# Patient Record
Sex: Female | Born: 1955
Health system: Southern US, Community
[De-identification: ages and names within clinical notes are randomized; demographics above are authoritative.]

## PROBLEM LIST (undated history)

## (undated) DIAGNOSIS — R011 Cardiac murmur, unspecified: Secondary | ICD-10-CM

## (undated) DIAGNOSIS — R112 Nausea with vomiting, unspecified: Secondary | ICD-10-CM

## (undated) DIAGNOSIS — I10 Essential (primary) hypertension: Secondary | ICD-10-CM

## (undated) DIAGNOSIS — M1990 Unspecified osteoarthritis, unspecified site: Secondary | ICD-10-CM

## (undated) DIAGNOSIS — R7303 Prediabetes: Secondary | ICD-10-CM

## (undated) DIAGNOSIS — E785 Hyperlipidemia, unspecified: Secondary | ICD-10-CM

## (undated) DIAGNOSIS — Z87898 Personal history of other specified conditions: Secondary | ICD-10-CM

## (undated) DIAGNOSIS — K760 Fatty (change of) liver, not elsewhere classified: Secondary | ICD-10-CM

## (undated) DIAGNOSIS — Z9889 Other specified postprocedural states: Secondary | ICD-10-CM

## (undated) DIAGNOSIS — E039 Hypothyroidism, unspecified: Secondary | ICD-10-CM

## (undated) DIAGNOSIS — K8020 Calculus of gallbladder without cholecystitis without obstruction: Secondary | ICD-10-CM

## (undated) DIAGNOSIS — I499 Cardiac arrhythmia, unspecified: Secondary | ICD-10-CM

## (undated) HISTORY — DX: Fatty (change of) liver, not elsewhere classified: K76.0

## (undated) HISTORY — DX: Calculus of gallbladder without cholecystitis without obstruction: K80.20

## (undated) HISTORY — DX: Essential (primary) hypertension: I10

## (undated) HISTORY — DX: Hypothyroidism, unspecified: E03.9

## (undated) HISTORY — PX: DILATION AND CURETTAGE OF UTERUS: SHX78

## (undated) HISTORY — DX: Hyperlipidemia, unspecified: E78.5

## (undated) HISTORY — PX: TONSILLECTOMY: SUR1361

## (undated) HISTORY — PX: JOINT REPLACEMENT: SHX530

## (undated) HISTORY — PX: ABDOMINAL HYSTERECTOMY: SHX81

---

## 1997-10-16 ENCOUNTER — Ambulatory Visit (HOSPITAL_COMMUNITY): Admission: RE | Admit: 1997-10-16 | Discharge: 1997-10-16 | Payer: Self-pay | Admitting: Obstetrics and Gynecology

## 1998-01-07 ENCOUNTER — Inpatient Hospital Stay (HOSPITAL_COMMUNITY): Admission: RE | Admit: 1998-01-07 | Discharge: 1998-01-10 | Payer: Self-pay | Admitting: Obstetrics and Gynecology

## 2001-05-23 ENCOUNTER — Other Ambulatory Visit: Admission: RE | Admit: 2001-05-23 | Discharge: 2001-05-23 | Payer: Self-pay | Admitting: Obstetrics and Gynecology

## 2002-01-23 HISTORY — PX: MOHS SURGERY: SUR867

## 2004-04-14 ENCOUNTER — Ambulatory Visit: Payer: Self-pay | Admitting: Internal Medicine

## 2004-06-16 ENCOUNTER — Other Ambulatory Visit: Admission: RE | Admit: 2004-06-16 | Discharge: 2004-06-16 | Payer: Self-pay | Admitting: Obstetrics and Gynecology

## 2004-07-14 ENCOUNTER — Ambulatory Visit: Payer: Self-pay | Admitting: Internal Medicine

## 2005-06-08 ENCOUNTER — Ambulatory Visit: Payer: Self-pay | Admitting: Internal Medicine

## 2005-06-20 ENCOUNTER — Ambulatory Visit: Payer: Self-pay | Admitting: Internal Medicine

## 2005-07-28 ENCOUNTER — Ambulatory Visit: Payer: Self-pay | Admitting: Internal Medicine

## 2005-08-01 ENCOUNTER — Ambulatory Visit: Payer: Self-pay | Admitting: Internal Medicine

## 2005-10-09 ENCOUNTER — Ambulatory Visit: Payer: Self-pay | Admitting: Internal Medicine

## 2005-10-23 ENCOUNTER — Ambulatory Visit: Payer: Self-pay

## 2005-12-20 ENCOUNTER — Ambulatory Visit: Payer: Self-pay | Admitting: Family Medicine

## 2006-01-08 ENCOUNTER — Ambulatory Visit: Payer: Self-pay

## 2006-02-27 ENCOUNTER — Ambulatory Visit: Payer: Self-pay | Admitting: Internal Medicine

## 2006-02-27 LAB — CONVERTED CEMR LAB
ALT: 25 units/L (ref 0–40)
AST: 28 units/L (ref 0–37)
Albumin: 4 g/dL (ref 3.5–5.2)
Alkaline Phosphatase: 55 units/L (ref 39–117)
BUN: 13 mg/dL (ref 6–23)
Basophils Absolute: 0 10*3/uL (ref 0.0–0.1)
Basophils Relative: 0.9 % (ref 0.0–1.0)
Bilirubin, Direct: 0.1 mg/dL (ref 0.0–0.3)
Cholesterol: 144 mg/dL (ref 0–200)
Creatinine, Ser: 0.9 mg/dL (ref 0.4–1.2)
Creatinine,U: 155 mg/dL
Eosinophils Absolute: 0.2 10*3/uL (ref 0.0–0.6)
Eosinophils Relative: 3.4 % (ref 0.0–5.0)
Free T4: 0.8 ng/dL (ref 0.6–1.6)
HCT: 44 % (ref 36.0–46.0)
HDL: 31.6 mg/dL — ABNORMAL LOW (ref >39.0)
Hemoglobin: 15.2 g/dL — ABNORMAL HIGH (ref 12.0–15.0)
Hgb A1c MFr Bld: 6.1 % — ABNORMAL HIGH (ref 4.6–6.0)
LDL Cholesterol: 74 mg/dL (ref 0–99)
Lymphocytes Relative: 32.7 % (ref 12.0–46.0)
MCHC: 34.6 g/dL (ref 30.0–36.0)
MCV: 86.8 fL (ref 78.0–100.0)
Microalb Creat Ratio: 6.5 mg/g (ref 0.0–30.0)
Microalb, Ur: 1 mg/dL (ref 0.0–1.9)
Monocytes Absolute: 0.4 10*3/uL (ref 0.2–0.7)
Monocytes Relative: 8.3 % (ref 3.0–11.0)
Neutro Abs: 2.9 10*3/uL (ref 1.4–7.7)
Neutrophils Relative %: 54.7 % (ref 43.0–77.0)
Platelets: 343 10*3/uL (ref 150–400)
Potassium: 3.8 meq/L (ref 3.5–5.1)
RBC: 5.07 M/uL (ref 3.87–5.11)
RDW: 13.1 % (ref 11.5–14.6)
T3 Uptake Ratio: 41.7 % — ABNORMAL HIGH (ref 22.5–37.0)
TSH: 1.77 microintl units/mL (ref 0.35–5.50)
Total Bilirubin: 0.7 mg/dL (ref 0.3–1.2)
Total CHOL/HDL Ratio: 4.6
Total Protein: 7.1 g/dL (ref 6.0–8.3)
Triglycerides: 192 mg/dL — ABNORMAL HIGH (ref 0–149)
VLDL: 38 mg/dL (ref 0–40)
WBC: 5.2 10*3/uL (ref 4.5–10.5)

## 2006-03-15 ENCOUNTER — Ambulatory Visit: Payer: Self-pay | Admitting: Internal Medicine

## 2006-05-09 DIAGNOSIS — Z85828 Personal history of other malignant neoplasm of skin: Secondary | ICD-10-CM

## 2006-05-09 DIAGNOSIS — Z9079 Acquired absence of other genital organ(s): Secondary | ICD-10-CM | POA: Insufficient documentation

## 2006-05-09 DIAGNOSIS — Z9089 Acquired absence of other organs: Secondary | ICD-10-CM | POA: Insufficient documentation

## 2006-05-09 HISTORY — DX: Personal history of other malignant neoplasm of skin: Z85.828

## 2006-08-22 ENCOUNTER — Ambulatory Visit: Payer: Self-pay | Admitting: Internal Medicine

## 2006-09-02 LAB — CONVERTED CEMR LAB
Cholesterol: 150 mg/dL (ref 0–200)
Direct LDL: 89.4 mg/dL
HDL: 27.3 mg/dL — ABNORMAL LOW (ref >39.0)
Total CHOL/HDL Ratio: 5.5
Triglycerides: 223 mg/dL (ref 0–149)
VLDL: 45 mg/dL — ABNORMAL HIGH (ref 0–40)

## 2006-09-03 ENCOUNTER — Encounter (INDEPENDENT_AMBULATORY_CARE_PROVIDER_SITE_OTHER): Payer: Self-pay | Admitting: *Deleted

## 2006-11-29 ENCOUNTER — Telehealth (INDEPENDENT_AMBULATORY_CARE_PROVIDER_SITE_OTHER): Payer: Self-pay | Admitting: *Deleted

## 2006-12-05 ENCOUNTER — Telehealth (INDEPENDENT_AMBULATORY_CARE_PROVIDER_SITE_OTHER): Payer: Self-pay | Admitting: *Deleted

## 2007-01-23 ENCOUNTER — Encounter: Payer: Self-pay | Admitting: Internal Medicine

## 2007-01-31 ENCOUNTER — Telehealth (INDEPENDENT_AMBULATORY_CARE_PROVIDER_SITE_OTHER): Payer: Self-pay | Admitting: *Deleted

## 2007-03-21 ENCOUNTER — Telehealth (INDEPENDENT_AMBULATORY_CARE_PROVIDER_SITE_OTHER): Payer: Self-pay | Admitting: *Deleted

## 2007-04-09 ENCOUNTER — Ambulatory Visit: Payer: Self-pay | Admitting: Internal Medicine

## 2007-04-15 ENCOUNTER — Telehealth (INDEPENDENT_AMBULATORY_CARE_PROVIDER_SITE_OTHER): Payer: Self-pay | Admitting: *Deleted

## 2007-04-30 ENCOUNTER — Telehealth: Payer: Self-pay | Admitting: Internal Medicine

## 2007-05-06 ENCOUNTER — Ambulatory Visit: Payer: Self-pay | Admitting: Internal Medicine

## 2007-05-15 ENCOUNTER — Encounter (INDEPENDENT_AMBULATORY_CARE_PROVIDER_SITE_OTHER): Payer: Self-pay | Admitting: *Deleted

## 2007-05-15 LAB — CONVERTED CEMR LAB
ALT: 28 units/L (ref 0–35)
AST: 32 units/L (ref 0–37)
Albumin: 3.8 g/dL (ref 3.5–5.2)
Alkaline Phosphatase: 54 units/L (ref 39–117)
BUN: 12 mg/dL (ref 6–23)
Basophils Absolute: 0 10*3/uL (ref 0.0–0.1)
Basophils Relative: 0.1 % (ref 0.0–1.0)
Bilirubin, Direct: 0.1 mg/dL (ref 0.0–0.3)
CO2: 30 meq/L (ref 19–32)
Calcium: 8.3 mg/dL — ABNORMAL LOW (ref 8.4–10.5)
Chloride: 105 meq/L (ref 96–112)
Cholesterol: 108 mg/dL (ref 0–200)
Creatinine, Ser: 0.8 mg/dL (ref 0.4–1.2)
Eosinophils Absolute: 0.2 10*3/uL (ref 0.0–0.7)
Eosinophils Relative: 3.5 % (ref 0.0–5.0)
GFR calc Af Amer: 97 mL/min
GFR calc non Af Amer: 80 mL/min
Glucose, Bld: 86 mg/dL (ref 70–99)
HCT: 44.4 % (ref 36.0–46.0)
HDL: 24.8 mg/dL — ABNORMAL LOW (ref >39.0)
Hemoglobin: 14.7 g/dL (ref 12.0–15.0)
LDL Cholesterol: 54 mg/dL (ref 0–99)
Lymphocytes Relative: 38.3 % (ref 12.0–46.0)
MCHC: 33 g/dL (ref 30.0–36.0)
MCV: 89.5 fL (ref 78.0–100.0)
Monocytes Absolute: 0.6 10*3/uL (ref 0.1–1.0)
Monocytes Relative: 12.9 % — ABNORMAL HIGH (ref 3.0–12.0)
Neutro Abs: 1.9 10*3/uL (ref 1.4–7.7)
Neutrophils Relative %: 45.2 % (ref 43.0–77.0)
Platelets: 279 10*3/uL (ref 150–400)
Potassium: 3.6 meq/L (ref 3.5–5.1)
RBC: 4.96 M/uL (ref 3.87–5.11)
RDW: 13.2 % (ref 11.5–14.6)
Sodium: 141 meq/L (ref 135–145)
TSH: 4.45 microintl units/mL (ref 0.35–5.50)
Total Bilirubin: 0.6 mg/dL (ref 0.3–1.2)
Total CHOL/HDL Ratio: 4.4
Total Protein: 6.7 g/dL (ref 6.0–8.3)
Triglycerides: 146 mg/dL (ref 0–149)
VLDL: 29 mg/dL (ref 0–40)
WBC: 4.3 10*3/uL — ABNORMAL LOW (ref 4.5–10.5)

## 2007-06-19 ENCOUNTER — Ambulatory Visit: Payer: Self-pay | Admitting: Internal Medicine

## 2007-09-11 ENCOUNTER — Encounter (INDEPENDENT_AMBULATORY_CARE_PROVIDER_SITE_OTHER): Payer: Self-pay | Admitting: *Deleted

## 2007-09-11 ENCOUNTER — Ambulatory Visit: Payer: Self-pay | Admitting: Internal Medicine

## 2007-09-11 DIAGNOSIS — Z9189 Other specified personal risk factors, not elsewhere classified: Secondary | ICD-10-CM | POA: Insufficient documentation

## 2007-09-11 DIAGNOSIS — E785 Hyperlipidemia, unspecified: Secondary | ICD-10-CM

## 2007-09-11 DIAGNOSIS — R413 Other amnesia: Secondary | ICD-10-CM | POA: Insufficient documentation

## 2007-09-11 DIAGNOSIS — I10 Essential (primary) hypertension: Secondary | ICD-10-CM

## 2007-09-11 DIAGNOSIS — E039 Hypothyroidism, unspecified: Secondary | ICD-10-CM | POA: Insufficient documentation

## 2007-09-11 HISTORY — DX: Hyperlipidemia, unspecified: E78.5

## 2007-09-11 HISTORY — DX: Essential (primary) hypertension: I10

## 2007-09-11 LAB — CONVERTED CEMR LAB
Cholesterol, target level: 200 mg/dL
HDL goal, serum: 40 mg/dL
LDL Goal: 160 mg/dL

## 2007-09-16 ENCOUNTER — Encounter (INDEPENDENT_AMBULATORY_CARE_PROVIDER_SITE_OTHER): Payer: Self-pay | Admitting: *Deleted

## 2007-09-16 LAB — CONVERTED CEMR LAB
ALT: 22 units/L (ref 0–35)
AST: 25 units/L (ref 0–37)
Albumin: 3.9 g/dL (ref 3.5–5.2)
Alkaline Phosphatase: 52 units/L (ref 39–117)
BUN: 11 mg/dL (ref 6–23)
Basophils Absolute: 0 10*3/uL (ref 0.0–0.1)
Basophils Relative: 0.7 % (ref 0.0–3.0)
Bilirubin, Direct: 0.1 mg/dL (ref 0.0–0.3)
Cholesterol: 160 mg/dL (ref 0–200)
Creatinine, Ser: 0.9 mg/dL (ref 0.4–1.2)
Eosinophils Absolute: 0.2 10*3/uL (ref 0.0–0.7)
Eosinophils Relative: 2.8 % (ref 0.0–5.0)
HCT: 45.4 % (ref 36.0–46.0)
HDL: 36.7 mg/dL — ABNORMAL LOW (ref >39.0)
Hemoglobin: 15.4 g/dL — ABNORMAL HIGH (ref 12.0–15.0)
LDL Cholesterol: 85 mg/dL (ref 0–99)
Lymphocytes Relative: 27 % (ref 12.0–46.0)
MCHC: 34 g/dL (ref 30.0–36.0)
MCV: 91.5 fL (ref 78.0–100.0)
Monocytes Absolute: 0.4 10*3/uL (ref 0.1–1.0)
Monocytes Relative: 6.4 % (ref 3.0–12.0)
Neutro Abs: 3.6 10*3/uL (ref 1.4–7.7)
Neutrophils Relative %: 63.1 % (ref 43.0–77.0)
Platelets: 314 10*3/uL (ref 150–400)
Potassium: 4.4 meq/L (ref 3.5–5.1)
RBC: 4.96 M/uL (ref 3.87–5.11)
RDW: 13.1 % (ref 11.5–14.6)
TSH: 2.54 microintl units/mL (ref 0.35–5.50)
Total Bilirubin: 0.9 mg/dL (ref 0.3–1.2)
Total CHOL/HDL Ratio: 4.4
Total Protein: 7 g/dL (ref 6.0–8.3)
Triglycerides: 190 mg/dL — ABNORMAL HIGH (ref 0–149)
VLDL: 38 mg/dL (ref 0–40)
WBC: 5.7 10*3/uL (ref 4.5–10.5)

## 2007-11-06 ENCOUNTER — Telehealth (INDEPENDENT_AMBULATORY_CARE_PROVIDER_SITE_OTHER): Payer: Self-pay | Admitting: *Deleted

## 2008-01-24 HISTORY — PX: COLONOSCOPY W/ POLYPECTOMY: SHX1380

## 2008-03-10 ENCOUNTER — Encounter: Payer: Self-pay | Admitting: Internal Medicine

## 2008-03-12 ENCOUNTER — Encounter: Payer: Self-pay | Admitting: Internal Medicine

## 2008-09-11 ENCOUNTER — Telehealth (INDEPENDENT_AMBULATORY_CARE_PROVIDER_SITE_OTHER): Payer: Self-pay | Admitting: *Deleted

## 2008-09-11 ENCOUNTER — Ambulatory Visit: Payer: Self-pay | Admitting: Internal Medicine

## 2008-09-11 DIAGNOSIS — Z8719 Personal history of other diseases of the digestive system: Secondary | ICD-10-CM | POA: Insufficient documentation

## 2008-09-11 DIAGNOSIS — K760 Fatty (change of) liver, not elsewhere classified: Secondary | ICD-10-CM

## 2008-09-11 DIAGNOSIS — K8020 Calculus of gallbladder without cholecystitis without obstruction: Secondary | ICD-10-CM | POA: Insufficient documentation

## 2008-09-11 DIAGNOSIS — K219 Gastro-esophageal reflux disease without esophagitis: Secondary | ICD-10-CM | POA: Insufficient documentation

## 2008-09-11 DIAGNOSIS — E8881 Metabolic syndrome: Secondary | ICD-10-CM

## 2008-09-11 HISTORY — DX: Calculus of gallbladder without cholecystitis without obstruction: K80.20

## 2008-09-11 HISTORY — DX: Metabolic syndrome: E88.810

## 2008-09-11 HISTORY — DX: Fatty (change of) liver, not elsewhere classified: K76.0

## 2008-09-21 ENCOUNTER — Encounter (INDEPENDENT_AMBULATORY_CARE_PROVIDER_SITE_OTHER): Payer: Self-pay | Admitting: *Deleted

## 2008-09-22 ENCOUNTER — Telehealth (INDEPENDENT_AMBULATORY_CARE_PROVIDER_SITE_OTHER): Payer: Self-pay | Admitting: *Deleted

## 2008-09-30 ENCOUNTER — Telehealth (INDEPENDENT_AMBULATORY_CARE_PROVIDER_SITE_OTHER): Payer: Self-pay | Admitting: *Deleted

## 2008-10-26 ENCOUNTER — Telehealth (INDEPENDENT_AMBULATORY_CARE_PROVIDER_SITE_OTHER): Payer: Self-pay | Admitting: *Deleted

## 2008-12-14 ENCOUNTER — Telehealth (INDEPENDENT_AMBULATORY_CARE_PROVIDER_SITE_OTHER): Payer: Self-pay | Admitting: *Deleted

## 2008-12-21 ENCOUNTER — Telehealth (INDEPENDENT_AMBULATORY_CARE_PROVIDER_SITE_OTHER): Payer: Self-pay | Admitting: *Deleted

## 2008-12-21 ENCOUNTER — Encounter (INDEPENDENT_AMBULATORY_CARE_PROVIDER_SITE_OTHER): Payer: Self-pay | Admitting: *Deleted

## 2008-12-22 ENCOUNTER — Ambulatory Visit: Payer: Self-pay | Admitting: Internal Medicine

## 2008-12-22 LAB — CONVERTED CEMR LAB
Ketones, urine, test strip: NEGATIVE
Nitrite: NEGATIVE
Specific Gravity, Urine: 1.01
Urobilinogen, UA: 0.2
WBC Urine, dipstick: NEGATIVE

## 2008-12-28 ENCOUNTER — Telehealth (INDEPENDENT_AMBULATORY_CARE_PROVIDER_SITE_OTHER): Payer: Self-pay | Admitting: *Deleted

## 2008-12-29 LAB — CONVERTED CEMR LAB
ALT: 21 units/L (ref 0–35)
Basophils Absolute: 0 10*3/uL (ref 0.0–0.1)
Basophils Relative: 0.7 % (ref 0.0–3.0)
Chloride: 104 meq/L (ref 96–112)
Cholesterol: 147 mg/dL (ref 0–200)
Eosinophils Absolute: 0.2 10*3/uL (ref 0.0–0.7)
GFR calc non Af Amer: 79.55 mL/min (ref >60)
HDL: 31.8 mg/dL — ABNORMAL LOW (ref >39.00)
MCHC: 33.2 g/dL (ref 30.0–36.0)
MCV: 91.5 fL (ref 78.0–100.0)
Monocytes Absolute: 0.5 10*3/uL (ref 0.1–1.0)
Neutro Abs: 3.9 10*3/uL (ref 1.4–7.7)
Neutrophils Relative %: 63.3 % (ref 43.0–77.0)
Potassium: 4.2 meq/L (ref 3.5–5.1)
RBC: 5.15 M/uL — ABNORMAL HIGH (ref 3.87–5.11)
RDW: 12.4 % (ref 11.5–14.6)
Sodium: 138 meq/L (ref 135–145)
Total Bilirubin: 0.9 mg/dL (ref 0.3–1.2)
Total Protein: 6.8 g/dL (ref 6.0–8.3)
VLDL: 39 mg/dL (ref 0.0–40.0)

## 2008-12-30 ENCOUNTER — Ambulatory Visit: Payer: Self-pay | Admitting: Internal Medicine

## 2009-01-21 ENCOUNTER — Telehealth (INDEPENDENT_AMBULATORY_CARE_PROVIDER_SITE_OTHER): Payer: Self-pay | Admitting: *Deleted

## 2009-03-11 ENCOUNTER — Ambulatory Visit: Payer: Self-pay | Admitting: Internal Medicine

## 2009-03-12 LAB — CONVERTED CEMR LAB
ALT: 22 units/L (ref 0–35)
Amylase: 77 units/L (ref 27–131)
Basophils Absolute: 0 10*3/uL (ref 0.0–0.1)
Eosinophils Absolute: 0.2 10*3/uL (ref 0.0–0.7)
H Pylori IgG: NEGATIVE
HCT: 45.3 % (ref 36.0–46.0)
Lipase: 28 units/L (ref 11.0–59.0)
Lymphs Abs: 2.1 10*3/uL (ref 0.7–4.0)
MCHC: 33.8 g/dL (ref 30.0–36.0)
MCV: 88.4 fL (ref 78.0–100.0)
Monocytes Absolute: 0.5 10*3/uL (ref 0.1–1.0)
Platelets: 374 10*3/uL (ref 150.0–400.0)
RDW: 12.4 % (ref 11.5–14.6)
Total Bilirubin: 0.4 mg/dL (ref 0.3–1.2)

## 2009-04-06 ENCOUNTER — Telehealth (INDEPENDENT_AMBULATORY_CARE_PROVIDER_SITE_OTHER): Payer: Self-pay | Admitting: *Deleted

## 2009-04-07 ENCOUNTER — Encounter (INDEPENDENT_AMBULATORY_CARE_PROVIDER_SITE_OTHER): Payer: Self-pay | Admitting: *Deleted

## 2009-04-08 ENCOUNTER — Encounter (INDEPENDENT_AMBULATORY_CARE_PROVIDER_SITE_OTHER): Payer: Self-pay | Admitting: *Deleted

## 2009-04-19 ENCOUNTER — Encounter (INDEPENDENT_AMBULATORY_CARE_PROVIDER_SITE_OTHER): Payer: Self-pay

## 2009-04-20 ENCOUNTER — Ambulatory Visit: Payer: Self-pay | Admitting: Gastroenterology

## 2009-04-22 ENCOUNTER — Telehealth (INDEPENDENT_AMBULATORY_CARE_PROVIDER_SITE_OTHER): Payer: Self-pay | Admitting: *Deleted

## 2009-05-05 ENCOUNTER — Ambulatory Visit: Payer: Self-pay | Admitting: Gastroenterology

## 2009-05-10 ENCOUNTER — Encounter: Payer: Self-pay | Admitting: Gastroenterology

## 2009-09-02 ENCOUNTER — Telehealth (INDEPENDENT_AMBULATORY_CARE_PROVIDER_SITE_OTHER): Payer: Self-pay | Admitting: *Deleted

## 2009-09-16 ENCOUNTER — Encounter: Admission: RE | Admit: 2009-09-16 | Discharge: 2009-10-25 | Payer: Self-pay | Admitting: Orthopedic Surgery

## 2009-10-07 ENCOUNTER — Telehealth (INDEPENDENT_AMBULATORY_CARE_PROVIDER_SITE_OTHER): Payer: Self-pay | Admitting: *Deleted

## 2010-02-08 ENCOUNTER — Telehealth: Payer: Self-pay | Admitting: Internal Medicine

## 2010-02-09 ENCOUNTER — Other Ambulatory Visit: Payer: Self-pay | Admitting: Internal Medicine

## 2010-02-09 ENCOUNTER — Ambulatory Visit
Admission: RE | Admit: 2010-02-09 | Discharge: 2010-02-09 | Payer: Self-pay | Source: Home / Self Care | Attending: Internal Medicine | Admitting: Internal Medicine

## 2010-02-09 ENCOUNTER — Encounter: Payer: Self-pay | Admitting: Internal Medicine

## 2010-02-09 DIAGNOSIS — Z860100 Personal history of colon polyps, unspecified: Secondary | ICD-10-CM

## 2010-02-09 HISTORY — DX: Personal history of colon polyps, unspecified: Z86.0100

## 2010-02-09 HISTORY — DX: Personal history of colonic polyps: Z86.010

## 2010-02-09 LAB — CBC WITH DIFFERENTIAL/PLATELET
Basophils Absolute: 0 10*3/uL (ref 0.0–0.1)
Basophils Relative: 0.8 % (ref 0.0–3.0)
Eosinophils Absolute: 0.2 10*3/uL (ref 0.0–0.7)
Eosinophils Relative: 3.3 % (ref 0.0–5.0)
HCT: 42.7 % (ref 36.0–46.0)
Hemoglobin: 14.6 g/dL (ref 12.0–15.0)
Lymphocytes Relative: 28.5 % (ref 12.0–46.0)
Lymphs Abs: 1.7 10*3/uL (ref 0.7–4.0)
MCHC: 34.3 g/dL (ref 30.0–36.0)
MCV: 89.3 fl (ref 78.0–100.0)
Monocytes Absolute: 0.4 10*3/uL (ref 0.1–1.0)
Monocytes Relative: 5.9 % (ref 3.0–12.0)
Neutro Abs: 3.8 10*3/uL (ref 1.4–7.7)
Neutrophils Relative %: 61.5 % (ref 43.0–77.0)
Platelets: 318 10*3/uL (ref 150.0–400.0)
RBC: 4.79 Mil/uL (ref 3.87–5.11)
RDW: 13.1 % (ref 11.5–14.6)
WBC: 6.1 10*3/uL (ref 4.5–10.5)

## 2010-02-09 LAB — LIPID PANEL
Cholesterol: 152 mg/dL (ref 0–200)
HDL: 36.7 mg/dL — ABNORMAL LOW (ref >39.00)
Total CHOL/HDL Ratio: 4
Triglycerides: 215 mg/dL — ABNORMAL HIGH (ref 0.0–149.0)
VLDL: 43 mg/dL — ABNORMAL HIGH (ref 0.0–40.0)

## 2010-02-09 LAB — HEPATIC FUNCTION PANEL
ALT: 21 U/L (ref 0–35)
AST: 22 U/L (ref 0–37)
Albumin: 3.8 g/dL (ref 3.5–5.2)
Alkaline Phosphatase: 55 U/L (ref 39–117)
Bilirubin, Direct: 0.1 mg/dL (ref 0.0–0.3)
Total Bilirubin: 0.7 mg/dL (ref 0.3–1.2)
Total Protein: 6.5 g/dL (ref 6.0–8.3)

## 2010-02-09 LAB — BASIC METABOLIC PANEL
BUN: 15 mg/dL (ref 6–23)
CO2: 28 mEq/L (ref 19–32)
Calcium: 9.1 mg/dL (ref 8.4–10.5)
Chloride: 106 mEq/L (ref 96–112)
Creatinine, Ser: 0.8 mg/dL (ref 0.4–1.2)
GFR: 74.88 mL/min (ref >60.00)
Glucose, Bld: 79 mg/dL (ref 70–99)
Potassium: 4.8 mEq/L (ref 3.5–5.1)
Sodium: 141 mEq/L (ref 135–145)

## 2010-02-09 LAB — TSH: TSH: 2.49 u[IU]/mL (ref 0.35–5.50)

## 2010-02-09 LAB — LDL CHOLESTEROL, DIRECT: Direct LDL: 86.7 mg/dL

## 2010-02-17 ENCOUNTER — Telehealth: Payer: Self-pay | Admitting: Internal Medicine

## 2010-02-20 LAB — CONVERTED CEMR LAB
ALT: 21 units/L (ref 0–35)
AST: 25 units/L (ref 0–37)
Albumin: 4 g/dL (ref 3.5–5.2)
BUN: 12 mg/dL (ref 6–23)
CO2: 32 meq/L (ref 19–32)
Chloride: 107 meq/L (ref 96–112)
Cholesterol: 168 mg/dL (ref 0–200)
Creatinine, Ser: 0.9 mg/dL (ref 0.4–1.2)
Direct LDL: 97.5 mg/dL
Glucose, Bld: 95 mg/dL (ref 70–99)
Hgb A1c MFr Bld: 5.9 % (ref 4.6–6.5)
Potassium: 4.5 meq/L (ref 3.5–5.1)
Total Bilirubin: 0.8 mg/dL (ref 0.3–1.2)
Total Protein: 7 g/dL (ref 6.0–8.3)
VLDL: 53.4 mg/dL — ABNORMAL HIGH (ref 0.0–40.0)

## 2010-02-24 NOTE — Procedures (Signed)
Summary: Colonoscopy  Patient: [name] Note: All result statuses are Final unless otherwise noted.  Tests: (1) Colonoscopy (COL)   COL Colonoscopy           DONE     [facility]     [address]     [place], [place]  [address]           COLONOSCOPY PROCEDURE REPORT           PATIENT:  [name] [name], [name] [name]  MR#:  [id number]     BIRTHDATE:  [birth year], 54 yrs. old  GENDER:  female     ENDOSCOPIST:  Rachael Fee, MD     REF. BY:  Marga Melnick, M.D.     PROCEDURE DATE:  05/05/2009     PROCEDURE:  Colonoscopy with snare polypectomy     ASA CLASS:  Class II     INDICATIONS:  Routine Risk Screening     MEDICATIONS:   Fentanyl 75 mcg IV, Versed 6 mg IV     DESCRIPTION OF PROCEDURE:   After the risks benefits and     alternatives of the procedure were thoroughly explained, informed     consent was obtained.  Digital rectal exam was performed and     revealed no rectal masses.   The LB [id number]-[id number] endoscope     was introduced through the anus and advanced to the cecum, which     was identified by both the appendix and ileocecal valve, without     limitations.  The quality of the prep was good, using MoviPrep.     The instrument was then slowly withdrawn as the colon was fully     examined.     <<PROCEDUREIMAGES>>     FINDINGS:  A sessile polyp was found in the sigmoid colon. This     was 6-49mm across, removed with cold snare and sent to pathology     (jar 1) (see image4).  Mild to moderate diverticulosis was found     in the sigmoid to descending colon segments (see image6).     External hemorrhoids were found. These were medium sized, not     thrombosed.  This was otherwise a normal examination of the colon     (see image3, image2, and image7).   Retroflexed views in the     rectum revealed no abnormalities.    The scope was then withdrawn     from the patient and the procedure completed.     COMPLICATIONS:  None           ENDOSCOPIC IMPRESSION:     1)  Subcentimeter sessile polyp in the sigmoid colon; removed and     sent to pathology     2) Mild to modereate diverticulosis in the sigmoid to descending     colon segments     3) Medium sized external hemorrhoids     4) Otherwise normal examination           RECOMMENDATIONS:     1) If the polyp(s) removed today are proven to be adenomatous     (pre-cancerous) polyps, you will need a repeat colonoscopy in 5     years. Otherwise you should continue to follow colorectal cancer     screening guidelines for "routine risk" patients with colonoscopy     in 10 years.     2) You will receive a letter within 1-2 weeks with the results     of your biopsy  as well as final recommendations. Please call my     office if you have not received a letter after 3 weeks.           ______________________________     Rachael Fee, MD           n.     eSIGNED:   Rachael Fee at 05/05/2009 10:08 AM           [place], [id number]  Note: An exclamation mark (!) indicates a result that was not dispersed into the flowsheet. Document Creation Date: 05/05/2009 10:08 AM _______________________________________________________________________  (1) Order result status: Final Collection or observation date-time: 05/05/2009 10:03 Requested date-time:  Receipt date-time:  Reported date-time:  Referring Physician:   Ordering Physician: Rob Bunting [phone]) Specimen Source:  Source: Launa Grill Order Number: [id number] Lab site:   Appended Document: Colonoscopy     Procedures Next Due Date:    Colonoscopy: 04/2014

## 2010-02-24 NOTE — Progress Notes (Signed)
Summary: RX refill  Phone Note Refill Request Message from:  Patient on February 17, 2010 12:42 PM  Refills Requested: Medication #1:  VYTORIN 10-20 MG  TABS TAKE 1/2 TAB EACH EVENING  Medication #2:  SERTRALINE HCL 50 MG TABS 1 by mouth two times a day  Medication #3:  LEVOTHYROXINE SODIUM 100 MCG 1 by mouth qd  Medication #4:  HYDROCHLOROTHIAZIDE 25 MG  TABS 1/2 by mouth once daily Losartan also. [facility]. Sertraline was changed to 50mg  two times a day so the patient can get it free from her job.  Initial call taken by: Lucious Groves CMA,  February 17, 2010 12:42 PM    New/Updated Medications: VYTORIN 10-20 MG  TABS (EZETIMIBE-SIMVASTATIN) TAKE 1/2 TAB EACH EVENING SERTRALINE HCL 50 MG TABS (SERTRALINE HCL) 1 by mouth two times a day Prescriptions: VYTORIN 10-20 MG  TABS (EZETIMIBE-SIMVASTATIN) TAKE 1/2 TAB EACH EVENING  #45 x 3   Entered by:   Lucious Groves CMA   Authorized by:   Marga Melnick MD   Signed by:   Lucious Groves CMA on 02/17/2010   Method used:   Faxed to ...       [facility] (mail-order)             , [place]         Ph: [phone]       Fax: [phone]   RxID:   [id number] LEVOTHYROXINE SODIUM 100 MCG 1 by mouth qd  #90 x 3   Entered by:   Lucious Groves CMA   Authorized by:   Marga Melnick MD   Signed by:   Lucious Groves CMA on 02/17/2010   Method used:   Faxed to ...       [facility] (mail-order)             , [place]         Ph: [phone]       Fax: [phone]   RxID:   [id number] SERTRALINE HCL 50 MG TABS (SERTRALINE HCL) 1 by mouth two times a day  #180 x 3   Entered by:   Lucious Groves CMA   Authorized by:   Marga Melnick MD   Signed by:   Lucious Groves CMA on 02/17/2010   Method used:   Faxed to ...       [facility] (mail-order)             , [place]         Ph: [phone]       Fax: [phone]   RxID:   [id number] HYDROCHLOROTHIAZIDE 25 MG  TABS (HYDROCHLOROTHIAZIDE) 1/2 by mouth once daily  #45 x 3   Entered  by:   Lucious Groves CMA   Authorized by:   Marga Melnick MD   Signed by:   Lucious Groves CMA on 02/17/2010   Method used:   Faxed to ...       [facility] (mail-order)             , [place]         Ph: [phone]       Fax: [phone]   RxID:   [id number] LOSARTAN POTASSIUM 100 MG TABS (LOSARTAN POTASSIUM) 1 once daily  #90 x 0   Entered by:   Lucious Groves CMA   Authorized by:   Marga Melnick MD   Signed by:   Lucious Groves CMA on 02/17/2010   Method used:   Faxed to [facility] [employer])             , [place]         Ph: [phone]       Fax: [phone]   RxID:   [id number]

## 2010-02-24 NOTE — Progress Notes (Signed)
Summary: refill  Phone Note Refill Request Message from:  Fax from Pharmacy on September 02, 2009 9:34 AM  Refills Requested: Medication #1:  HYDROCHLOROTHIAZIDE 25 MG  TABS 1/2 by mouth once daily [facility] - fax [phone]  Initial call taken by: Okey Regal [place],  September 02, 2009 9:42 AM    Prescriptions: HYDROCHLOROTHIAZIDE 25 MG  TABS (HYDROCHLOROTHIAZIDE) 1/2 by mouth once daily  #90 x 1   Entered by:   Shonna Chock CMA   Authorized by:   Marga Melnick MD   Signed by:   Shonna Chock CMA on 09/02/2009   Method used:   Electronically to        [facility]* (retail)       [address]       [place], [place]  [address]       Ph: [phone]       Fax: [phone]   RxID:   [id number]

## 2010-02-24 NOTE — Progress Notes (Signed)
Summary: Ins changes/RX's  Phone Note Call from Patient Call back at [phone]   Summary of Call: Patient called stating that her insurance company has now gone to [employer], but she can get some from NP at her job for free. Patient would like printed prescriptions so she can give them to appropriate place. I made her aware that she is due for CPX and it is best to do it all at one time. Patient expressed understanding and will schedule CPX.  She will call me in the meantime if any prescriptions are needed. Initial call taken by: Lucious Groves CMA,  February 08, 2010 2:56 PM

## 2010-02-24 NOTE — Letter (Signed)
Summary: [facility] Instructions  [facility]  [address] [place], [place] [address]   Phone: [phone]  Fax: [phone]       ERYKA DOLINGER    December 31, 1955    MRN: [id number]        Procedure Day Dorna Bloom:  [facility]  05/05/09     Arrival Time:  9:00AM     Procedure Time:  10:00AM     Location of Procedure:                    _X _  [facility] (4th Floor)                        PREPARATION FOR COLONOSCOPY WITH MOVIPREP   Starting 5 days prior to your procedure 04/30/09 do not eat nuts, seeds, popcorn, corn, beans, peas,  salads, or any raw vegetables.  Do not take any fiber supplements (e.g. Metamucil, Citrucel, and Benefiber).  THE DAY BEFORE YOUR PROCEDURE         DATE: 05/04/09  DAY: TUESDAY  1.  Drink clear liquids the entire day-NO SOLID FOOD  2.  Do not drink anything colored red or purple.  Avoid juices with pulp.  No orange juice.  3.  Drink at least 64 oz. (8 glasses) of fluid/clear liquids during the day to prevent dehydration and help the prep work efficiently.  CLEAR LIQUIDS INCLUDE: Water Jello Ice Popsicles Tea (sugar ok, no milk/cream) Powdered fruit flavored drinks Coffee (sugar ok, no milk/cream) Gatorade Juice: apple, white grape, white cranberry  Lemonade Clear bullion, consomm, broth Carbonated beverages (any kind) Strained chicken noodle soup Hard Candy                             4.  In the morning, mix first dose of MoviPrep solution:    Empty 1 Pouch A and 1 Pouch B into the disposable container    Add lukewarm drinking water to the top line of the container. Mix to dissolve    Refrigerate (mixed solution should be used within 24 hrs)  5.  Begin drinking the prep at 5:00 p.m. The MoviPrep container is divided by 4 marks.   Every 15 minutes drink the solution down to the next mark (approximately 8 oz) until the full liter is complete.   6.  Follow completed prep with 16 oz of clear liquid of your choice  (Nothing red or purple).  Continue to drink clear liquids until bedtime.  7.  Before going to bed, mix second dose of MoviPrep solution:    Empty 1 Pouch A and 1 Pouch B into the disposable container    Add lukewarm drinking water to the top line of the container. Mix to dissolve    Refrigerate  THE DAY OF YOUR PROCEDURE      DATE: 05/05/09 DAY: WEDNESDAY  Beginning at 5:00AM (5 hours before procedure):         1. Every 15 minutes, drink the solution down to the next mark (approx 8 oz) until the full liter is complete.  2. Follow completed prep with 16 oz. of clear liquid of your choice.    3. You may drink clear liquids until 8:00AM (2 HOURS BEFORE PROCEDURE).   MEDICATION INSTRUCTIONS  Unless otherwise instructed, you should take regular prescription medications with a small sip of water   as early as possible the morning of  your procedure.  Diabetic patients - see separate instructions.  Stop taking Plavix or Aggrenox on  _  _  (7 days before procedure).     Stop taking Coumadin on  _ _  (5 days before procedure).  Additional medication instructions: _         OTHER INSTRUCTIONS  You will need a responsible adult at least 55 years of age to accompany you and drive you home.   This person must remain in the waiting room during your procedure.  Wear loose fitting clothing that is easily removed.  Leave jewelry and other valuables at home.  However, you may wish to bring a book to read or  an iPod/MP3 player to listen to music as you wait for your procedure to start.  Remove all body piercing jewelry and leave at home.  Total time from sign-in until discharge is approximately 2-3 hours.  You should go home directly after your procedure and rest.  You can resume normal activities the  day after your procedure.  The day of your procedure you should not:   Drive   Make legal decisions   Operate machinery   Drink alcohol   Return to work  You will receive  specific instructions about eating, activities and medications before you leave.    The above instructions have been reviewed and explained to me by   Ulis Rias RN  April 20, 2009 10:08 AM_    I fully understand and can verbalize these instructions _____________________________ Date _________

## 2010-02-24 NOTE — Letter (Signed)
Summary: Results Letter  [facility]  [address] [place], [place] [address]   Phone: [phone]  Fax: [phone]        May 10, 2009 MRN: [id number]    [facility] [address] [place], [place]  [address]    Dear Ms. Facey,   At least one of the polyps removed during your recent procedure was proven to be adenomatous.  These are pre-cancerous polyps that may have grown into cancers if they had not been removed.  Based on current nationally recognized surveillance guidelines, I recommend that you have a repeat colonoscopy in 5 years.  We will therefore put your information in our reminder system and will contact you in 5 years to schedule a repeat procedure.  Please call if you have any questions or concerns.       Sincerely,  Rachael Fee MD  This letter has been electronically signed by your physician.  Appended Document: Results Letter letter mailed 4.20.11.

## 2010-02-24 NOTE — Assessment & Plan Note (Signed)
Summary: X 3 weeks-diarrhea & upset stomach/scm   Vital Signs:  Patient profile:   55 year old female Weight:      252.8 pounds Temp:     98.4 degrees F oral Pulse rate:   76 / minute Resp:     15 per minute BP sitting:   122 / 82  (left arm) Cuff size:   large  Vitals Entered By: Shonna Chock (March 11, 2009 1:51 PM) CC: Stomach issues x 3 weeks: Upset stomach, diarrhea, pain and gas. ? Gallbladder related, Diarrhea Comments REVIEWED MED LIST, PATIENT AGREED DOSE AND INSTRUCTION CORRECT  Temporary on Zpak for Gum Infection   CC:  Stomach issues x 3 weeks: Upset stomach, diarrhea, pain and gas. ? Gallbladder related, and Diarrhea.  History of Present Illness:  Diarrhea      This is a 55 year old woman who presents with bowel changes > 3 weeks.  The patient reports 3 -4 stools or less per day, semiformed/loose stools, voluminous stools, fecal urgency, fasting diarrhea, bloating, gassiness, and gradual onset of symptoms, but denies blood in stool, mucus in stool, greasy stools, malodorous stools, fecal soiling, alternating diarrhea/constipation, and nocturnal diarrhea.  Associated symptoms include  sharp  epigastric abdominal pain  worse @ night and nausea.  The patient denies fever, abdominal cramps, vomiting, lightheadedness, increased thirst, weight loss, joint pains, mouth ulcers, and eye redness.  The symptoms are worse with specific foods, popcorn & peanuts, but not with milk/ dairy or wheat. Rx: Pepcid & TUMS help.  Patient's risk factors for diarrhea do not  include recent antibiotic use until Zpack Rxed 2 days ago for infected gum.   PMH of large gall stone.  Allergies: 1)  ! Ampicillin 2)  ! * Doxycline  Review of Systems GI:  Complains of gas; Bloating ;  approx 12 mos ago Gyn exam neg . GU:  Denies abnormal vaginal bleeding, discharge, dysuria, and hematuria.  Physical Exam  General:  well-nourished,in no acute distress; alert,appropriate and cooperative throughout  examination Eyes:  No corneal or conjunctival inflammation noted. EOMI Perrla. No icterus  Mouth:  Oral mucosa and oropharynx without lesions or exudates.  Teeth in good repair.No pharyngeal erythema.   Lungs:  Normal respiratory effort, chest expands symmetrically. Lungs are clear to auscultation, no crackles or wheezes. Heart:  Normal rate and regular rhythm. S1 and S2 normal without gallop, murmur, click, rub. Abdomen:  Bowel sounds positive,abdomen soft but slightly tender in epigatric area  without masses, organomegaly or hernias noted. Skin:  Intact without suspicious lesions or rashes but damp Cervical Nodes:  No lymphadenopathy noted Axillary Nodes:  No palpable lymphadenopathy Psych:  memory intact for recent and remote, normally interactive, and good eye contact.     Impression & Recommendations:  Problem # 1:  CHANGE IN BOWELS (GNF-621.30)  Orders: Venipuncture (86578) TLB-Lipase (83690-LIPASE)  Problem # 2:  ABDOMINAL PAIN, EPIGASTRIC (ICD-789.06)  Orders: Venipuncture (46962) TLB-CBC Platelet - w/Differential (85025-CBCD) TLB-Hepatic/Liver Function Pnl (80076-HEPATIC) TLB-Amylase (82150-AMYL) TLB-Lipase (83690-LIPASE) TLB-H. Pylori Abs(Helicobacter Pylori) (86677-HELICO)  Complete Medication List: 1)  Vytorin 10-20 Mg Tabs (Ezetimibe-simvastatin) .... Take 1/2 tab each evening 2)  Sertraline Hcl 100 Mg Tabs (Sertraline hcl) .... Take one tablet daily 3)  Synthroid 100 Mcg Tabs (Levothyroxine sodium) .Marland Kitchen.. 1 by mouth qd 4)  Premarin  .Marland Kitchen.. 1 by mouth once daily 5)  Hydrochlorothiazide 25 Mg Tabs (Hydrochlorothiazide) .... 1/2 by mouth once daily 6)  Losartan Potassium 100 Mg Tabs (Losartan potassium) .Marland Kitchen.. 1 once  daily 7)  Ranitidine Hcl 150 Mg Tabs (Ranitidine hcl) .Marland Kitchen.. 1 two times a day pre meals  Patient Instructions: 1)  Avoid foods high in acid (tomatoes, citrus juices, spicy foods). Avoid eating within two hours of lying down or before exercising. Do not over  eat; try smaller more frequent meals. Elevate head of bed twelve inches when sleeping. Prescriptions: RANITIDINE HCL 150 MG TABS (RANITIDINE HCL) 1 two times a day pre meals  #60 x 2   Entered and Authorized by:   Marga Melnick MD   Signed by:   Marga Melnick MD on 03/11/2009   Method used:   Faxed to ...       [facility]* (retail)       [address]       [place], [place]  [address]       Ph: [phone]       Fax: [phone]   RxID:   [id number]

## 2010-02-24 NOTE — Progress Notes (Signed)
Summary: referral colonoscopy  Phone Note Call from Patient   Summary of Call: PT LEFT VM THAT SHE IS DUE FOR COLONOSCOPY AND IS UNSURE IF SHE NEEDS TO SCHEDULE APPT OR IF SHE NEEDS A REFERRAL. PT STATES THAT SHE WOULD LIKE A CALL BACK TO DISCUSS THIS MATTER..................Marland KitchenFelecia Deloach CMA  April 06, 2009 3:56 PM   Follow-up for Phone Call        called pt back informed pt referral put in just awaiting appt information, pt ok...............Marland KitchenFelecia Deloach CMA  April 06, 2009 4:01 PM   New Problems: SCREENING, COLON CANCER (ICD-V76.51)   New Problems: SCREENING, COLON CANCER (ICD-V76.51)

## 2010-02-24 NOTE — Miscellaneous (Signed)
Summary: Lec previsit  Clinical Lists Changes  Medications: Added new medication of MOVIPREP 100 GM  SOLR (PEG-KCL-NACL-NASULF-NA ASC-C) As per prep instructions. - Signed Rx of MOVIPREP 100 GM  SOLR (PEG-KCL-NACL-NASULF-NA ASC-C) As per prep instructions.;  #1 x 0;  Signed;  Entered by: Ulis Rias RN;  Authorized by: Rachael Fee MD;  Method used: Electronically to [facility] [address], [place], [place], [place]  [address], Ph: [phone], Fax: [phone] Observations: Added new observation of ALLERGY REV: Done (04/20/2009 9:01)    Prescriptions: MOVIPREP 100 GM  SOLR (PEG-KCL-NACL-NASULF-NA ASC-C) As per prep instructions.  #1 x 0   Entered by:   Ulis Rias RN   Authorized by:   Rachael Fee MD   Signed by:   Ulis Rias RN on 04/20/2009   Method used:   Electronically to        [facility]* (retail)       [address]       [place], [place]  [address]       Ph: [phone]       Fax: [phone]   RxID:   [id number]

## 2010-02-24 NOTE — Progress Notes (Signed)
Summary: LOSARTAN REFILL--SEE NOTE  Phone Note Refill Request Message from:  Fax from Pharmacy on October 07, 2009 4:58 PM  Refills Requested: Medication #1:  LOSARTAN POTASSIUM 100 MG TABS 1 once daily [facility], [address] [place], [place]  ZOX [phone]   NOTE SAYS "PATIENT SUBMITTED  PRESCRIPTION THAT HAD MISSING OR UNCLEAR INFO"    QTY = 90  Initial call taken by: Jerolyn Shin,  October 07, 2009 5:01 PM    Prescriptions: LOSARTAN POTASSIUM 100 MG TABS (LOSARTAN POTASSIUM) 1 once daily  #90 x 0   Entered by:   Shonna Chock CMA   Authorized by:   Marga Melnick MD   Signed by:   Shonna Chock CMA on 10/08/2009   Method used:   Faxed to ...       [facility]* (retail)             ,          Ph: [phone]       Fax: [phone]   RxID:   [id number]

## 2010-02-24 NOTE — Letter (Signed)
Summary: Primary Care Consult Scheduled Letter  [facility]  [address] [place], [place] [address]   Phone: [phone]  Fax: [phone]      04/08/2009 MRN: [id number]  [name] [name] [address] [place], [place]  [address]    Dear Ms. Goldsmith,      We have scheduled an appointment for you.  At the recommendation of Dr. Marga Melnick, we have scheduled you for a Screening Colonoscopy with Dr. Rob Bunting of [facility].  Your initial Pre-visit with a Nurse is on 04-20-2009 at 9:00am.  Your Colonoscopy is on 05-05-2009 arrive no later than 9:00am.  Their address is [address] [place] [address]. The office phone number is [phone].  If this appointment day and time is not convenient for you, please feel free to call the office of the doctor you are being referred to at the number listed above and reschedule the appointment.    It is important for you to keep your scheduled appointments. We are here to make sure you are given good patient care.   Thank you,    Renee, Patient Care Coordinator [facility]

## 2010-02-24 NOTE — Assessment & Plan Note (Signed)
Summary: cpx and fasting labs///sph   Vital Signs:  Patient profile:   55 year old female Height:      66.75 inches Weight:      247.4 pounds BMI:     39.18 Temp:     98.5 degrees F oral Pulse rate:   72 / minute Resp:     14 per minute BP sitting:   128 / 80  (left arm) Cuff size:   large  Vitals Entered By: Shonna Chock CMA (February 09, 2010 10:51 AM) CC: CPX with fasting labs , Lipid Management   CC:  CPX with fasting labs  and Lipid Management.  History of Present Illness:    [name] [name] is here for a physical; she is being treated by Dr Madelon Lips for Achilles tendonitis with Diclofenac.    Hyperlipidemia Follow-Up: The patient denies muscle aches, GI upset, abdominal pain, flushing, itching, constipation, diarrhea, and fatigue.  The patient denies the following symptoms: chest pain/pressure, exercise intolerance, dypsnea, palpitations, syncope, and pedal edema.  Compliance with medications (by patient report) has been near 100%.  Dietary compliance has been good.  The patient reports exercising 3-4X per week.  Her Formulary will require med change.    Hypertension Follow-Up:    The patient denies lightheadedness and urinary frequency.  Compliance with medications (by patient report) has been near 100%.  Adjunctive measures currently used by the patient include salt restriction.  BP not monitored  Lipid Management History:      Positive NCEP/ATP III risk factors include HDL cholesterol less than 40, family history for ischemic heart disease (males less than 40 years old), and hypertension.  Negative NCEP/ATP III risk factors include female age less than 14 years old, no history of early menopause without estrogen hormone replacement, non-diabetic, non-tobacco-user status, no ASHD (atherosclerotic heart disease), no prior stroke/TIA, no peripheral vascular disease, and no history of aortic aneurysm.     Current Medications (verified): 1)  Vytorin 10-20 Mg  Tabs  (Ezetimibe-Simvastatin) .... Take 1/2 Tab Each Evening 2)  Sertraline Hcl 100 Mg  Tabs (Sertraline Hcl) .... Take One Tablet Daily 3)  Synthroid 100 Mcg  Tabs (Levothyroxine Sodium) .Marland Kitchen.. 1 By Mouth Qd 4)  Premarin .Marland Kitchen.. 1 By Mouth Once Daily 5)  Hydrochlorothiazide 25 Mg  Tabs (Hydrochlorothiazide) .... 1/2 By Mouth Once Daily 6)  Losartan Potassium 100 Mg Tabs (Losartan Potassium) .Marland Kitchen.. 1 Once Daily 7)  Ranitidine Hcl 150 Mg Tabs (Ranitidine Hcl) .Marland Kitchen.. 1 Two Times A Day Pre Meals  Allergies: 1)  ! Ampicillin 2)  ! * Doxycline  Past History:  Past Medical History: CARCINOMA, BASAL CELL, PMH OF (ICD-V10.83), Dr Janora Norlander, [place], Hyperlipidemia: Fr[place]ham Study LDL goal = < 130. 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  Weight down 15 # with W Ws  Physical Exam  General:  well-nourished;alert,appropriate and cooperative throughout examination Head:  Normocephalic and atraumatic without obvious abnormalities.  Eyes:  No corneal or conjunctival inflammation noted. Perrla. Funduscopic exam benign, without hemorrhages, exudates or  papilledema.  Ears:  External ear exam shows no significant lesions or deformities.  Otoscopic examination reveals clear canals, tympanic membranes are intact bilaterally without bulging, retraction, inflammation or discharge. Hearing is grossly normal bilaterally. Nose:  External nasal examination shows no deformity or inflammation. Nasal mucosa are pink and moist without lesions or exudates. Mouth:  Oral mucosa and oropharynx without lesions or exudates.  Teeth in good repair. Neck:  No deformities, masses, or tenderness noted. Lungs:  Normal respiratory effort, chest expands symmetrically. Lungs are clear to auscultation, no crackles or wheezes. Heart:  normal rate, regular rhythm, no gallop, no rub, no JVD, no HJR, and grade 1/2 -1  /6 systolic murmur.   Abdomen:  Bowel sounds positive,abdomen soft and non-tender without masses, organomegaly or hernias noted. Genitalia:  Dr Tenny Craw, Gyn Msk:  No deformity or scoliosis noted of thoracic or lumbar spine.   Pulses:  R and L carotid,radial,dorsalis pedis and posterior tibial pulses are full and equal bilaterally Extremities:  No clubbing, cyanosis, edema. Decreased ROM L knee Neurologic:  alert & oriented X3 and DTRs symmetrical and normal.   Skin:  Intact without suspicious lesions or rashes Cervical Nodes:  No lymphadenopathy noted Axillary Nodes:  No palpable lymphadenopathy Psych:  memory intact for recent and remote, normally interactive, and good eye contact.     Impression & Recommendations:  Problem # 1:  ROUTINE GENERAL MEDICAL EXAM@[email or web address]  CARE FACL (ICD-V70.0)  Orders: EKG w/ Interpretation (93000) Venipuncture (10932) TLB-Lipid Panel (80061-LIPID) TLB-BMP (Basic Metabolic Panel-BMET) (80048-METABOL) TLB-CBC Platelet - w/Differential (85025-CBCD) TLB-Hepatic/Liver Function Pnl (80076-HEPATIC) TLB-TSH (Thyroid Stimulating Hormone) (84443-TSH)  Problem # 2:  HYPERTENSION (ICD-401.9) controlled Her updated medication list for  this problem includes:    Hydrochlorothiazide 25 Mg Tabs (Hydrochlorothiazide) .Marland Kitchen... 1/2 by mouth once daily    Losartan Potassium 100 Mg Tabs (Losartan potassium) .Marland Kitchen... 1 once daily  Problem # 3:  HYPERLIPIDEMIA (ICD-272.4)  Her updated medication list for this problem includes:    Vytorin 10-20 Mg Tabs (Ezetimibe-simvastatin) .Marland Kitchen... Take 1/2 tab each evening  Problem # 4:  HYPOTHYROIDISM (ICD-244.9)  Complete Medication List: 1)  Vytorin 10-20 Mg Tabs (Ezetimibe-simvastatin) .... Take 1/2 tab each evening 2)  Sertraline Hcl 100 Mg Tabs (Sertraline hcl) .... Take one tablet daily 3)  Levothyroxine Sodium 100 Mcg  .Marland KitchenMarland Kitchen. 1 by mouth qd 4)  Premarin  .Marland Kitchen.. 1 by mouth once daily 5)  Hydrochlorothiazide 25 Mg Tabs (Hydrochlorothiazide) .... 1/2 by mouth once daily 6)  Losartan Potassium 100 Mg Tabs (Losartan potassium) .Marland Kitchen.. 1 once daily 7)  Ranitidine Hcl 150 Mg Tabs (Ranitidine hcl) .Marland Kitchen.. 1 two times a day pre meals  Lipid Assessment/Plan:      Based on NCEP/ATP III, the patient's risk factor category is "2 or more risk factors and a calculated 10 year CAD risk of < 20%".  The patient's lipid goals are as follows: Total cholesterol goal is 200; LDL cholesterol goal is 160; HDL cholesterol goal is 40; Triglyceride goal is 150.  Her LDL cholesterol goal has been met.    Patient Instructions: 1)  Statin change (Pravastatin or Simvastatin)  will be based on Lipids. Rxs will be sent based on labs. 2)  Check your Blood Pressure regularly. If it is above:  135/85 ON AVERAGE you should make an appointment.   Orders Added: 1)  Est. Patient 40-64 years [99396] 2)  EKG w/ Interpretation [93000] 3)  Venipuncture [36415] 4)  TLB-Lipid Panel [80061-LIPID] 5)  TLB-BMP (Basic Metabolic Panel-BMET) [80048-METABOL] 6)  TLB-CBC Platelet - w/Differential [85025-CBCD] 7)  TLB-Hepatic/Liver Function Pnl [80076-HEPATIC] 8)  TLB-TSH (Thyroid Stimulating Hormone) [13244-WNU]

## 2010-02-24 NOTE — Progress Notes (Signed)
Summary: REFILL  Phone Note Refill Request Message from:  Fax from Pharmacy on April 22, 2009 8:20 AM  Refills Requested: Medication #1:  VYTORIN 10-20 MG  TABS TAKE 1/2 [facility] [phone]   Method Requested: Fax to Local Pharmacy Next Appointment Scheduled: NO APPT Initial call taken by: Barb Merino,  April 22, 2009 8:21 AM    Prescriptions: VYTORIN 10-20 MG  TABS (EZETIMIBE-SIMVASTATIN) TAKE 1/2 TAB EACH EVENING  #90 x 0   Entered by:   Shonna Chock   Authorized by:   Marga Melnick MD   Signed by:   Shonna Chock on 04/22/2009   Method used:   Faxed to ...       [facility]* (mail-order)             ,          Ph: [phone]       Fax: [phone]   RxID:   [id number]

## 2010-02-24 NOTE — Letter (Signed)
Summary: Previsit letter  [facility]  [address] [place], [place] [address]   Phone: [phone]  Fax: [phone]       04/07/2009 MRN: [id number]  [facility] [address] [place], [place]  [address]  Dear [name] [name],  Welcome to the Gastroenterology Division at [facility].    You are scheduled to see a nurse for your pre-procedure visit on 04-20-09 at 9am on the 3rd floor at [facility], [address].  We ask that you try to arrive at our office 15 minutes prior to your appointment time to allow for check-in.  Your nurse visit will consist of discussing your medical and surgical history, your immediate family medical history, and your medications.    Please bring a complete list of all your medications or, if you prefer, bring the medication bottles and we will list them.  We will need to be aware of both prescribed and over the counter drugs.  We will need to know exact dosage information as well.  If you are on blood thinners (Coumadin, Plavix, Aggrenox, Ticlid, etc.) please call our office today/prior to your appointment, as we need to consult with your physician about holding your medication.   Please be prepared to read and sign documents such as consent forms, a financial agreement, and acknowledgement forms.  If necessary, and with your consent, a friend or relative is welcome to sit-in on the nurse visit with you.  Please bring your insurance card so that we may make a copy of it.  If your insurance requires a referral to see a specialist, please bring your referral form from your primary care physician.  No co-pay is required for this nurse visit.     If you cannot keep your appointment, please call [phone] to cancel or reschedule prior to your appointment date.  This allows [place] the opportunity to schedule an appointment for another patient in need of care.    Thank you for choosing [facility] for your medical  needs.  We appreciate the opportunity to care for you.  Please visit [place] at our website  to learn more about our practice.                     Sincerely.                                                                                                                   The Gastroenterology Division

## 2010-09-14 ENCOUNTER — Other Ambulatory Visit: Payer: Self-pay | Admitting: Internal Medicine

## 2010-10-10 ENCOUNTER — Ambulatory Visit (INDEPENDENT_AMBULATORY_CARE_PROVIDER_SITE_OTHER): Payer: BC Managed Care – PPO | Admitting: Internal Medicine

## 2010-10-10 ENCOUNTER — Encounter: Payer: Self-pay | Admitting: Internal Medicine

## 2010-10-10 ENCOUNTER — Ambulatory Visit: Payer: Self-pay | Admitting: Internal Medicine

## 2010-10-10 VITALS — BP 130/78 | HR 87 | Wt 253.0 lb

## 2010-10-10 DIAGNOSIS — E039 Hypothyroidism, unspecified: Secondary | ICD-10-CM

## 2010-10-10 DIAGNOSIS — IMO0002 Reserved for concepts with insufficient information to code with codable children: Secondary | ICD-10-CM

## 2010-10-10 DIAGNOSIS — M1710 Unilateral primary osteoarthritis, unspecified knee: Secondary | ICD-10-CM

## 2010-10-10 NOTE — Progress Notes (Signed)
  Subjective:    Patient ID: [name] [name], female    DOB: 1955/09/07, 55 y.o.   MRN: [id number]  HPI #1 ? Thyroid Problem:  Medications status(change in dose/brand/mode of administration):Synthroid was changed to generic 01/2010 Constitutional: Weight change: up ?; Fatigue:yes; Sleep pattern:chronic issue as frequent awakening; Appetite:stable  Visual change(blurred/diplopia/visual loss):no Hoarseness:no; Swallowing issues:choking even with saliva Cardiovascular: Palpitations:minor with anxiety; Racing:no; Irregularity:no GI: Constipation:no; Diarrhea:no Derm: Change in nails/hair/skin:dryness, nails brittle Neuro: Numbness/tingling:in hands , positional @ work & @ night; Tremor:no Psych: Anxiety:yes recently; Depression:no; Panic attacks:no Endo: Temperature intolerance: Heat:no; Cold:yes   #2 Extremity pain Location:L knee Onset:2 + years Trigger/injury:post fall X 2 Pain quality:sharp Pain severity: up to 5 Duration:as long as walking Radiation:no Exacerbating factors: humidity change Treatment/response: Her orthopedist as injected steroids and collagen without benefit. He is recommended at home he replacement Review of systems:  Musculoskeletal:calf   muscle cramps LLE; no  joint stiffness, redness, or swelling Skin:no rash, color change Heme:abnormal bruising or bleeding       Review of Systems she denies abdominal pain, frank dysphagia,dispepsia, melena, or rectal bleeding.     Objective:   Physical Exam  Gen.:  well-nourished; in no acute distress Eyes: Extraocular motion intact; no lid lag or proptosis  Mouth: Excellent pedal hygiene; no definite oropharyngeal erythema.   Neck: Full range of motion; normal thyroid to palpation  Heart: Normal rhythm and rate without significant  gallop, or extra heart sounds Lungs: Chest clear to auscultation without rales,rales, wheezes Neuro:Deep tendon reflexes are equal and within normal limits; no tremor  Skin:  Warm and dry without significant lesions or rashes; no onycholysis  Musculoskeletal:Markedly decreased range of motion left knee with crepitus & pain. No cyanosis, clubbing, or edema. Psych: Normally communicative and interactive; no abnormal mood or affect clinically.         Assessment & Plan:  #1 hypothyroidism; her multiplicity of symptoms ( see above) suggest possible inadequate dose  replacement or lack of efficacy of  the generic thyroid supplement  #2 degenerative joint disease of the left knee, end stage , with compromise of activities of daily living; surgery when she concurs  #3 swallowing dysfunction without definite goiter. Ultrasound may be necessary based on TSH . If  symptoms persistent or progressive, GI  evaluation would be indicated.

## 2010-10-10 NOTE — Patient Instructions (Addendum)
Eat a low-fat diet with lots of fruits and vegetables, up to 7-9 servings per day. Avoid obesity; your goal is waist measurement < 35 inches.Consume less than 30  grams of sugar per day from foods & drinks with High Fructose Corn Sugar as #1,2,3 or # 4 on label. Follow the low carb nutrition program in [employer] as closely as possible to prevent Diabetes progression & complications. White carbohydrates (potatoes, rice, bread, and pasta) have a high spike of sugar and a high load of sugar. For example a  baked potato has a cup of sugar and a  french fry  2 teaspoons of sugar. Yams, wild  rice, whole grained bread &  wheat pasta have been much lower spike and load of  sugar. Portions should be the size of a deck of cards or your palm. Exercise  30-45  minutes a day, 3-4 days a week as Ellipitical machine, stationary bike or water aerobics.  I recommend a GI  consultation  If swallowing issues persist or progress

## 2010-11-17 ENCOUNTER — Other Ambulatory Visit: Payer: Self-pay | Admitting: Internal Medicine

## 2011-03-09 ENCOUNTER — Telehealth: Payer: Self-pay | Admitting: Internal Medicine

## 2011-03-09 NOTE — Telephone Encounter (Signed)
Preferred alternatives are Crestor, Atorvastatin, or Fluvastatin Sodium.

## 2011-03-09 NOTE — Telephone Encounter (Signed)
Do not refill Vytorin ; change to Crestor 20 mg daily # 90  as per your insurance plan's requirements. TEN weeks after change to new medication check   fasting Labs : BMET,Lipids, hepatic panel, CK, TSH. PLEASE BRING THESE INSTRUCTIONS TO FOLLOW UP  LAB APPOINTMENT.This will guarantee correct labs are drawn, eliminating need for repeat blood sampling ( needle sticks ! ). Diagnoses /Codes: 272.4, 995.20. Please see me 3-5 days later

## 2011-03-09 NOTE — Telephone Encounter (Signed)
[facility]: Patient would like alternative to Vytorin. Preferred alternatives are Crestor,

## 2011-03-10 ENCOUNTER — Telehealth: Payer: Self-pay | Admitting: Internal Medicine

## 2011-03-10 MED ORDER — ROSUVASTATIN CALCIUM 20 MG PO TABS: 20.0000 mg | ORAL_TABLET | Freq: Every day | ORAL | Status: DC

## 2011-03-10 NOTE — Telephone Encounter (Signed)
Discuss with patient  

## 2011-03-10 NOTE — Telephone Encounter (Signed)
To: [place] (Daytime Triage) Fax: [phone] From: [facility] Date/ Time: 03/10/2011 12:46 PM Taken By: Lyn Hollingshead, RN Caller: Darl Pikes Facility: not collected Patient: Margaurite, Salido DOB: 1955/02/19 Phone: [phone] Reason for Call: Pt is returning a call, she can be reached at cell [phone] or office [phone]. Regarding Appointment: Appt Date: Appt Time: Unknown

## 2011-03-10 NOTE — Telephone Encounter (Signed)
Left message to call office

## 2011-03-10 NOTE — Telephone Encounter (Signed)
See other phone notes which pertain to switching her from Vytorin to Crestor with followup labs in 10 weeks.

## 2011-06-26 ENCOUNTER — Other Ambulatory Visit: Payer: Self-pay | Admitting: *Deleted

## 2011-06-26 MED ORDER — ROSUVASTATIN CALCIUM 20 MG PO TABS: 20.0000 mg | ORAL_TABLET | Freq: Every day | ORAL | Status: DC

## 2011-06-26 NOTE — Telephone Encounter (Signed)
Per last note labs due in 10 weeks after starting med. Pt aware Rx sent, appt scheduled.

## 2011-06-27 ENCOUNTER — Other Ambulatory Visit (INDEPENDENT_AMBULATORY_CARE_PROVIDER_SITE_OTHER): Payer: BC Managed Care – PPO

## 2011-06-27 DIAGNOSIS — E785 Hyperlipidemia, unspecified: Secondary | ICD-10-CM

## 2011-06-27 DIAGNOSIS — T887XXA Unspecified adverse effect of drug or medicament, initial encounter: Secondary | ICD-10-CM

## 2011-06-27 LAB — BASIC METABOLIC PANEL
BUN: 13 mg/dL (ref 6–23)
Chloride: 106 mEq/L (ref 96–112)
GFR: 73.49 mL/min (ref >60.00)
Glucose, Bld: 93 mg/dL (ref 70–99)
Potassium: 4 mEq/L (ref 3.5–5.1)

## 2011-06-27 LAB — CK: Total CK: 299 U/L — ABNORMAL HIGH (ref 7–177)

## 2011-06-27 LAB — HEPATIC FUNCTION PANEL
ALT: 21 U/L (ref 0–35)
AST: 26 U/L (ref 0–37)
Albumin: 3.8 g/dL (ref 3.5–5.2)

## 2011-06-27 LAB — LIPID PANEL
LDL Cholesterol: 57 mg/dL (ref 0–99)
VLDL: 39 mg/dL (ref 0.0–40.0)

## 2011-06-27 NOTE — Progress Notes (Signed)
Labs only

## 2011-07-03 ENCOUNTER — Encounter: Payer: Self-pay | Admitting: *Deleted

## 2011-07-10 ENCOUNTER — Telehealth: Payer: Self-pay | Admitting: Internal Medicine

## 2011-07-10 MED ORDER — ROSUVASTATIN CALCIUM 20 MG PO TABS: 20.0000 mg | ORAL_TABLET | Freq: Every day | ORAL | Status: DC

## 2011-07-10 NOTE — Telephone Encounter (Signed)
Pt aware Rx sent and f/u not due until 09-2011, Pt ok info.

## 2011-07-10 NOTE — Telephone Encounter (Signed)
OK # 90 

## 2011-07-10 NOTE — Telephone Encounter (Signed)
Caller: Abaigeal/Patient; PCP: Marga Melnick;  Call regarding being put on - Crestor, given 90 day supply and then another 30 day supply. Had Blood Work Done on 06/27/11 At Dr. Frederik Pear Request Before He Would Fill Her Medication, She Got A. Letter Saying Everything Is OK, But At the End of the Letter It [place]iel She Would Need To Make an Appointment Before Getting Medications. She is needing to have RX faxed to Mail order pharmacy soon so she doesn't run out. She is wondering why she needs to come in to have refill. PLEASE CALL HER TO LET HER KNOW CB#: [phone] Medication Questions Protocol.

## 2011-09-04 ENCOUNTER — Other Ambulatory Visit: Payer: Self-pay | Admitting: Internal Medicine

## 2011-09-12 ENCOUNTER — Telehealth: Payer: Self-pay | Admitting: Internal Medicine

## 2011-09-12 NOTE — Telephone Encounter (Signed)
Patient with pending appointment tomorrow

## 2011-09-12 NOTE — Telephone Encounter (Signed)
Caller: [name]/Patient; Patient Name: [name] [name]; PCP: Marga Melnick; Best Callback Phone Number: [phone].  Pt calling regarding authorization form she dropped off on 09/11/11.  Pt states Dr Madelon Lips with [facility] is working pt in to have knee replacement surgery ASAP but they need this authorization form to proceed.  Pt is requesting form be completed and faxed to their office as soon as possible.  Pt is also requesting a call back from the office regarding form completion.  [phone] OR [phone].

## 2011-09-12 NOTE — Telephone Encounter (Signed)
Last seen 9/12; needs pre op evaluation

## 2011-09-13 ENCOUNTER — Encounter: Payer: Self-pay | Admitting: Internal Medicine

## 2011-09-13 ENCOUNTER — Ambulatory Visit (INDEPENDENT_AMBULATORY_CARE_PROVIDER_SITE_OTHER): Payer: BC Managed Care – PPO | Admitting: Internal Medicine

## 2011-09-13 VITALS — BP 124/80 | HR 77 | Temp 98.7°F | Resp 12 | Ht 67.0 in | Wt 255.4 lb

## 2011-09-13 DIAGNOSIS — IMO0002 Reserved for concepts with insufficient information to code with codable children: Secondary | ICD-10-CM

## 2011-09-13 DIAGNOSIS — Z01818 Encounter for other preprocedural examination: Secondary | ICD-10-CM

## 2011-09-13 DIAGNOSIS — R748 Abnormal levels of other serum enzymes: Secondary | ICD-10-CM

## 2011-09-13 DIAGNOSIS — M1710 Unilateral primary osteoarthritis, unspecified knee: Secondary | ICD-10-CM

## 2011-09-13 DIAGNOSIS — M1712 Unilateral primary osteoarthritis, left knee: Secondary | ICD-10-CM

## 2011-09-13 NOTE — Patient Instructions (Addendum)
The most common cause of elevated triglycerides is the ingestion of sugar from high fructose corn syrup sources added to processed foods & drinks.  Eat a low-fat diet with lots of fruits and vegetables, up to 7-9 servings per day. Consume less than 30 (preferably ZERO) grams of sugar per day from foods & drinks with High Fructose Corn Syrup (HFCS) sugar as #1,2,3 or # 4 on label.Whole Foods, [employer] & [employer] do not carry products with HFCS. Follow a  low carb nutrition program such as [place] or The New Sugar Busters  to prevent Diabetes progression . White carbohydrates (potatoes, rice, bread, and pasta) have a high spike of sugar and a high load of sugar. For example a  baked potato has a cup of sugar and a  french fry  2 teaspoons of sugar. Yams, wild  rice, whole grained bread &  wheat pasta have been much lower spike and load of  sugar. Portions should be the size of a deck of cards or your palm.  Review and correct the record as indicated. Please share record with all medical staff seen.   If you activate My Chart; the results can be released to you as soon as they populate from the lab. If you choose not to use this program; the labs have to be reviewed, copied & mailed   causing a delay in getting the results to you.

## 2011-09-13 NOTE — Progress Notes (Signed)
Subjective:    Patient ID: [name] [name], female    DOB: 12-Oct-1955, 56 y.o.   MRN: [id number]  HPI She is scheduled by Dr. Madelon Lips 09/22/11 for total left knee replacement for end-stage degenerative disease. Symptoms have been progressive over 3-4 years. She describes variable intensity and character pain up to a level X, worse after activities of daily living. By the end of the week she states she is using crutches. The joint has actually locked up on occasion; there's been no associated redness or swelling. Symptoms have minimally  responded to multiple intra-articular steroid injections as well as apparent collagen intra-articular injections during this period. She's also taken narcotic pain medications for pain control. Remotely she has had left knee joint displacement on at least 3 occasions.  Past medical history, surgical history, family history, social history were reviewed and updated. Significantly she has had some anesthesia intolerance with marked nausea the past. Imaging has demonstrated hepatic steatosis.  Lab studies performed 06/27/11 were reviewed. CK was mildly elevated at 299; triglycerides 195; AST and ALT were normal. LDL was 57.    Review of Systems Constitutional: no fever, chills, sweats, change in weight  Musculoskeletal:no  muscle cramps or pain Skin:no rash, color change Neuro: no weakness; incontinence (stool/urine); numbness and tingling Heme:no lymphadenopathy; abnormal bruising or bleeding        Objective:   Physical Exam Gen.:  well-nourished in appearance. Alert, appropriate and cooperative throughout exam. Eyes: No corneal or conjunctival inflammation noted.No icterus Nose: External nasal exam reveals no deformity or inflammation. Nasal mucosa are pink and moist. No lesions or exudates noted.  Mouth: Oral mucosa and oropharynx reveal no lesions or exudates. Teeth in good repair. Neck: No deformities, masses, or tenderness noted.  Thyroid  normal. Lungs: Normal respiratory effort; chest expands symmetrically. Lungs are clear to auscultation without rales, wheezes, or increased work of breathing. Heart: Normal rate and rhythm. Normal S1 and S2. No gallop, click, or rub. S4 w/o murmur. Abdomen: Bowel sounds normal; abdomen soft and nontender. No masses, organomegaly or hernias noted.                                                                              Musculoskeletal/extremities: Slight lordosis noted of the upper thoracic  spine. No clubbing, cyanosis, edema, or deformity noted. Dramatic decreased range of motion of the left knee with fusiform changes and crepitus .Tone & strength  normal.Nail health  good. Vascular: Carotid, radial artery, dorsalis pedis and  posterior tibial pulses are full and equal. No bruits present. Neurologic: Alert and oriented x3. Deep tendon reflexes symmetrical and normal.         Skin: Intact without suspicious lesions or rashes. Lymph: No cervical, axillary lymphadenopathy present. Psych: Mood and affect are normal. Normally interactive  Assessment & Plan:  #1 end-stage degenerative changes left knee with a dramatic impact on activities of daily living and with intractable pain. Surgery appropriate.  Medically pertinent issues reviewed and discussed. No contraindication to surgery. EKG does reveal left anterior fascicular block which is stable. No ischemic changes present. Low voltage in the V leads due 2 body habitus/breast tissue

## 2011-09-14 ENCOUNTER — Telehealth: Payer: Self-pay | Admitting: Internal Medicine

## 2011-09-14 NOTE — Telephone Encounter (Signed)
I faxed notes (OV and labs) to [facility], left message on voicemail informing patient.

## 2011-09-14 NOTE — Telephone Encounter (Signed)
Surgery clearance needs to be faxed ASAP to the number on the bottom of the clearance form.  Pt.  Expected this to be done yesterday and is quite anxious to have this done now, because the deadline was yesterday.  Please contact pt.  To let this know that this was done.  Her Number is: [phone], or cell: [phone].

## 2011-09-15 ENCOUNTER — Encounter (HOSPITAL_COMMUNITY): Payer: Self-pay | Admitting: Pharmacy Technician

## 2011-09-18 ENCOUNTER — Other Ambulatory Visit: Payer: Self-pay | Admitting: Physician Assistant

## 2011-09-19 ENCOUNTER — Encounter (HOSPITAL_COMMUNITY): Payer: Self-pay

## 2011-09-19 ENCOUNTER — Encounter (HOSPITAL_COMMUNITY)
Admission: RE | Admit: 2011-09-19 | Discharge: 2011-09-19 | Disposition: A | Discharge status: Home / Self Care | Payer: BC Managed Care – PPO | Source: Ambulatory Visit | Attending: Orthopedic Surgery | Admitting: Orthopedic Surgery

## 2011-09-19 HISTORY — DX: Unspecified osteoarthritis, unspecified site: M19.90

## 2011-09-19 HISTORY — DX: Other specified postprocedural states: Z98.890

## 2011-09-19 HISTORY — DX: Nausea with vomiting, unspecified: R11.2

## 2011-09-19 HISTORY — DX: Nausea with vomiting, unspecified: Z98.890

## 2011-09-19 LAB — CBC WITH DIFFERENTIAL/PLATELET
Eosinophils Absolute: 0.3 10*3/uL (ref 0.0–0.7)
Hemoglobin: 15 g/dL (ref 12.0–15.0)
Lymphocytes Relative: 26 % (ref 12–46)
Lymphs Abs: 2.3 10*3/uL (ref 0.7–4.0)
MCH: 29.4 pg (ref 26.0–34.0)
MCV: 88.8 fL (ref 78.0–100.0)
Monocytes Relative: 7 % (ref 3–12)
Neutrophils Relative %: 63 % (ref 43–77)
RBC: 5.1 MIL/uL (ref 3.87–5.11)
WBC: 8.8 10*3/uL (ref 4.0–10.5)

## 2011-09-19 LAB — COMPREHENSIVE METABOLIC PANEL
ALT: 26 U/L (ref 0–35)
Alkaline Phosphatase: 73 U/L (ref 39–117)
BUN: 15 mg/dL (ref 6–23)
CO2: 28 mEq/L (ref 19–32)
GFR calc Af Amer: 80 mL/min — ABNORMAL LOW (ref >90)
GFR calc non Af Amer: 69 mL/min — ABNORMAL LOW (ref >90)
Glucose, Bld: 92 mg/dL (ref 70–99)
Potassium: 4.1 mEq/L (ref 3.5–5.1)
Total Bilirubin: 0.2 mg/dL — ABNORMAL LOW (ref 0.3–1.2)
Total Protein: 7.4 g/dL (ref 6.0–8.3)

## 2011-09-19 LAB — URINALYSIS, ROUTINE W REFLEX MICROSCOPIC
Bilirubin Urine: NEGATIVE
Glucose, UA: NEGATIVE mg/dL
Hgb urine dipstick: NEGATIVE
Specific Gravity, Urine: 1.022 (ref 1.005–1.030)
pH: 7 (ref 5.0–8.0)

## 2011-09-19 LAB — PROTIME-INR: Prothrombin Time: 13.4 seconds (ref 11.6–15.2)

## 2011-09-19 LAB — SURGICAL PCR SCREEN: Staphylococcus aureus: NEGATIVE

## 2011-09-19 LAB — URINE MICROSCOPIC-ADD ON

## 2011-09-19 NOTE — Pre-Procedure Instructions (Signed)
20 Kynsli Haapala  09/19/2011   Your procedure is scheduled on:  Friday August 30  Report to [facility] at 8:00 AM.  Call this number if you have problems the morning of surgery: [phone]   Remember:   Do not eat or drink:After Midnight.    Take these medicines the morning of surgery with A SIP OF WATER: Synthroid (levothyroxine), Zoloft (sertraline). Vicodin pain pill if needed.   Do not wear jewelry, make-up or nail polish.  Do not wear lotions, powders, or perfumes. You may wear deodorant.  Do not shave 48 hours prior to surgery. Men may shave face and neck.  Do not bring valuables to the hospital.  Contacts, dentures or bridgework may not be worn into surgery.  Leave suitcase in the car. After surgery it may be brought to your room.  For patients admitted to the hospital, checkout time is 11:00 AM the day of discharge.   Patients discharged the day of surgery will not be allowed to drive home.  Name and phone number of your driver: NA  Special Instructions: Incentive Spirometry - Practice and bring it with you on the day of surgery. and CHG Shower Use Special Wash: 1/2 bottle night before surgery and 1/2 bottle morning of surgery.   Please read over the following fact sheets that you were given: Pain Booklet, Coughing and Deep Breathing, Blood Transfusion Information, Total Joint Packet and Surgical Site Infection Prevention

## 2011-09-19 NOTE — Progress Notes (Signed)
Chart left for anesthesia to review EKG, old EKGs on chart.

## 2011-09-20 LAB — URINE CULTURE: Culture: NO GROWTH

## 2011-09-20 LAB — TYPE AND SCREEN: Antibody Screen: NEGATIVE

## 2011-09-20 NOTE — Consult Note (Signed)
Anesthesia Chart Review:  Patient is a 56 year old female scheduled for left TKA by Dr. Madelon Lips on 09/22/11.  History includes non-smoker, obesity, post-operative N/V, HLD, HTN, hypothyroidism, hepatic steatosis, arthritis, cholelithiasis.  PCP is Dr. Alwyn Ren.  He saw her for a preoperative evaluation and felt there was no contraindication for surgery.    Labs noted.  Urine culture is pending.  Normal CXR on 09/19/11.  EKG on 09/13/11 (done at her pre-operative visit with Dr. Shary Decamp) showed NSR, LAFB.  Overall, it appears stable.    Anticipate she can proceed as planned.  Shonna Chock, PA-C

## 2011-09-21 MED ORDER — CLINDAMYCIN PHOSPHATE 900 MG/50ML IV SOLN
900.0000 mg | INTRAVENOUS | Status: AC
  Administered 2011-09-22: 900 mg via INTRAVENOUS
  Filled 2011-09-21: qty 50

## 2011-09-21 NOTE — H&P (Signed)
 NAME: [name] [name] MRN: [id number] DATE: September 19, 2011 DOB: 01/31/1955  COMPLAINT:     Follow up left knee osteoarthritis.  HPI:     The patient is a 56-year-old female with a history of left knee osteoarthritis, high cholesterol, hypothyroidism, and hypertension who is here today to discuss possible left total knee arthroplasty.  She has failed conservative treatments with corticosteroid injection, viscosupplementation, oral NSAIDs, oral pain medications, and also at times required the use of crutches.  The pain is now significantly affecting her quality of life and activities of daily living.   It does occasionally awaken her from sleep at night.   CURRENT MEDICATIONS:    Crestor 20 mg daily, estrogen 0.62 one to one and one-half daily, HCTZ 0.25 mg one-half daily, Synthroid 100 mg daily, losartan 100 mg daily, and sertraline 50 mg daily.     ALLERGIES:     AMPICILLIN and DOXYCYCLINE. She also had problems with nausea following anesthesia in the past.  PAST MEDICAL HISTORY:     Left knee osteoarthritis, high cholesterol, and hypothyroidism, hypertension.  HOSPITALIZATIONS:  Kidney stones around 1992.  PAST SURGICAL HISTORY:     Hysterectomy in 1998, tonsillectomy in 1998, and MOHs surgery for a basal cell 2004.  ROS:    Ten point systemic review is obtained and is positive for high blood pressure and thyroid problems; otherwise noncontributory.  Please refer to patient information form for details.  FAMILY HISTORY:    Positive in her mother for hypertension, diabetes, and cancer; father cancer; brother hypertension and diabetes.  SOCIAL HISTORY:    Nonsmoker, nondrinker. She has two living children.  She is married and lives with her husband.  She works as a client service representative.  She is currently working at this time.  She lives in a two-story residence.  EXAM:     The patient is seated in the examination room in some obvious discomfort favoring the left knee.  She is alert,  oriented, and appears appropriate age.  HT:  5'7"   WT:  255 pounds  BMI:  39.9  TEMP:  98.1 degrees  BP:  133/70    P:  78     R:  18  Skin:   No rashes, no lesions. HEENT:   PERRLA.  Neck is supple with good range of motion.   Chest/Lungs: Clear to auscultation bilaterally.  Normal breath sounds and effort.      Heart:   Regular rate and rhythm.  Does have systolic murmur. Abdomen:   Soft, non tender.  Active bowel sounds in all four quadrants. Breast:    Not indicated for surgery. Rectal: Not indicated for surgery. GU: Not indicated for surgery. Neuro:   Cranial nerves grossly intact. Musculoskeletal:    Examination of the left lower extremity shows that she is neurovascularly intact.  She has tenderness to palpation along the medial and lateral joint lines in the knee, more so on the medial aspect.  Positive patellofemoral crepitus.  Stable ligamentous testing.  Denies calf pain with palpation.  Has intact dorsiflexion and plantar flexion of the ankle.  Ambulates with an antalgic fait favoring the left lower extremity.  She has an approximately 5-10 degree deficit in full extension.  IMPRESSION:      1. Left knee osteoarthritis end-stage. 2. High cholesterol. 3. Hypothyroidism. 4. Hypertension.  RECOMMENDATIONS:     The risks and benefits of left total knee arthroplasty were discussed again today and the patient wishes to proceed.  She   has a preadmission appointment also today at  Hospital with surgery currently scheduled for 09/22/11.  She does wish to go home following the surgery. Home health is currently arranged with [facility].  She had obtained medical clearance from her primary care physician, Dr. Hopper.  She was given preoperative instructions.  Also discussed postoperative DVT prophylaxis and perioperative antibiotics.  She will be given clindamycin perioperatively and Lovenox after surgery.  If she has any questions or concerns prior to surgery she may contact the  office.  Josh Rainbow Salman P.A.-C/[id number]  Auto-Authenticated by Josh Bleu Minerd P.A.-C 

## 2011-09-22 ENCOUNTER — Encounter (HOSPITAL_COMMUNITY): Payer: Self-pay | Admitting: Vascular Surgery

## 2011-09-22 ENCOUNTER — Ambulatory Visit (HOSPITAL_COMMUNITY): Payer: BC Managed Care – PPO | Admitting: Vascular Surgery

## 2011-09-22 ENCOUNTER — Inpatient Hospital Stay (HOSPITAL_COMMUNITY): Payer: BC Managed Care – PPO

## 2011-09-22 ENCOUNTER — Encounter (HOSPITAL_COMMUNITY)
Admission: RE | Disposition: A | Discharge status: Home Health | Payer: Self-pay | Source: Ambulatory Visit | Attending: Orthopedic Surgery

## 2011-09-22 ENCOUNTER — Inpatient Hospital Stay (HOSPITAL_COMMUNITY)
Admission: RE | Admit: 2011-09-22 | Discharge: 2011-09-25 | DRG: 209 | Disposition: A | Discharge status: Home Health | Payer: BC Managed Care – PPO | Source: Ambulatory Visit | Attending: Orthopedic Surgery | Admitting: Orthopedic Surgery

## 2011-09-22 DIAGNOSIS — G4733 Obstructive sleep apnea (adult) (pediatric): Secondary | ICD-10-CM | POA: Diagnosis present

## 2011-09-22 DIAGNOSIS — Z6841 Body Mass Index (BMI) 40.0 and over, adult: Secondary | ICD-10-CM

## 2011-09-22 DIAGNOSIS — Z01811 Encounter for preprocedural respiratory examination: Secondary | ICD-10-CM

## 2011-09-22 DIAGNOSIS — Z79899 Other long term (current) drug therapy: Secondary | ICD-10-CM

## 2011-09-22 DIAGNOSIS — Z01812 Encounter for preprocedural laboratory examination: Secondary | ICD-10-CM

## 2011-09-22 DIAGNOSIS — K219 Gastro-esophageal reflux disease without esophagitis: Secondary | ICD-10-CM | POA: Diagnosis present

## 2011-09-22 DIAGNOSIS — F411 Generalized anxiety disorder: Secondary | ICD-10-CM | POA: Diagnosis present

## 2011-09-22 DIAGNOSIS — M1710 Unilateral primary osteoarthritis, unspecified knee: Principal | ICD-10-CM | POA: Diagnosis present

## 2011-09-22 DIAGNOSIS — E7800 Pure hypercholesterolemia, unspecified: Secondary | ICD-10-CM | POA: Diagnosis present

## 2011-09-22 DIAGNOSIS — E039 Hypothyroidism, unspecified: Secondary | ICD-10-CM | POA: Diagnosis present

## 2011-09-22 DIAGNOSIS — Z01818 Encounter for other preprocedural examination: Secondary | ICD-10-CM

## 2011-09-22 DIAGNOSIS — Z96659 Presence of unspecified artificial knee joint: Secondary | ICD-10-CM

## 2011-09-22 DIAGNOSIS — IMO0002 Reserved for concepts with insufficient information to code with codable children: Principal | ICD-10-CM | POA: Diagnosis present

## 2011-09-22 DIAGNOSIS — I10 Essential (primary) hypertension: Secondary | ICD-10-CM | POA: Diagnosis present

## 2011-09-22 HISTORY — PX: TOTAL KNEE ARTHROPLASTY: SHX125

## 2011-09-22 SURGERY — ARTHROPLASTY, KNEE, TOTAL
Anesthesia: General | Site: Knee | Laterality: Left | Wound class: Clean

## 2011-09-22 MED ORDER — FENTANYL CITRATE 0.05 MG/ML IJ SOLN
INTRAMUSCULAR | Status: AC
  Filled 2011-09-22: qty 2

## 2011-09-22 MED ORDER — MIDAZOLAM HCL 2 MG/2ML IJ SOLN
INTRAMUSCULAR | Status: AC
  Filled 2011-09-22: qty 2

## 2011-09-22 MED ORDER — ONDANSETRON HCL 4 MG/2ML IJ SOLN
INTRAMUSCULAR | Status: DC | PRN
  Administered 2011-09-22: 4 mg via INTRAVENOUS

## 2011-09-22 MED ORDER — ONDANSETRON HCL 4 MG/2ML IJ SOLN: 4.0000 mg | Freq: Four times a day (QID) | INTRAMUSCULAR | Status: DC | PRN

## 2011-09-22 MED ORDER — HYDROMORPHONE HCL PF 1 MG/ML IJ SOLN
0.2500 mg | INTRAMUSCULAR | Status: DC | PRN
  Administered 2011-09-22: 0.5 mg via INTRAVENOUS

## 2011-09-22 MED ORDER — SERTRALINE HCL 50 MG PO TABS
50.0000 mg | ORAL_TABLET | Freq: Every day | ORAL | Status: DC
  Administered 2011-09-23 – 2011-09-25 (×3): 50 mg via ORAL
  Filled 2011-09-22 (×3): qty 1

## 2011-09-22 MED ORDER — LACTATED RINGERS IV SOLN
INTRAVENOUS | Status: DC
  Administered 2011-09-22 (×3): via INTRAVENOUS

## 2011-09-22 MED ORDER — CHLORHEXIDINE GLUCONATE 4 % EX LIQD: 60.0000 mL | Freq: Once | CUTANEOUS | Status: DC

## 2011-09-22 MED ORDER — LIDOCAINE HCL (CARDIAC) 20 MG/ML IV SOLN: INTRAVENOUS | Status: DC | PRN

## 2011-09-22 MED ORDER — SCOPOLAMINE 1 MG/3DAYS TD PT72: 1.0000 | MEDICATED_PATCH | Freq: Once | TRANSDERMAL | Status: DC

## 2011-09-22 MED ORDER — ACETAMINOPHEN 10 MG/ML IV SOLN
1000.0000 mg | Freq: Four times a day (QID) | INTRAVENOUS | Status: AC
  Administered 2011-09-22 – 2011-09-23 (×4): 1000 mg via INTRAVENOUS
  Filled 2011-09-22 (×4): qty 100

## 2011-09-22 MED ORDER — ADULT MULTIVITAMIN W/MINERALS CH
1.0000 | ORAL_TABLET | Freq: Every day | ORAL | Status: DC
  Administered 2011-09-22 – 2011-09-25 (×4): 1 via ORAL
  Filled 2011-09-22 (×4): qty 1

## 2011-09-22 MED ORDER — CLINDAMYCIN PHOSPHATE 600 MG/50ML IV SOLN
600.0000 mg | Freq: Four times a day (QID) | INTRAVENOUS | Status: AC
  Administered 2011-09-22 – 2011-09-23 (×2): 600 mg via INTRAVENOUS
  Filled 2011-09-22 (×2): qty 50

## 2011-09-22 MED ORDER — METHOCARBAMOL 500 MG PO TABS
500.0000 mg | ORAL_TABLET | Freq: Four times a day (QID) | ORAL | Status: DC | PRN
  Administered 2011-09-22 – 2011-09-24 (×7): 500 mg via ORAL
  Filled 2011-09-22 (×8): qty 1

## 2011-09-22 MED ORDER — HYDROMORPHONE HCL PF 1 MG/ML IJ SOLN
1.0000 mg | INTRAMUSCULAR | Status: DC | PRN
  Administered 2011-09-22 – 2011-09-24 (×9): 1 mg via INTRAVENOUS
  Filled 2011-09-22 (×9): qty 1

## 2011-09-22 MED ORDER — DIPHENHYDRAMINE HCL 12.5 MG/5ML PO ELIX: 12.5000 mg | ORAL_SOLUTION | ORAL | Status: DC | PRN

## 2011-09-22 MED ORDER — DEXAMETHASONE SODIUM PHOSPHATE 4 MG/ML IJ SOLN
INTRAMUSCULAR | Status: DC | PRN
  Administered 2011-09-22: 4 mg via INTRAVENOUS

## 2011-09-22 MED ORDER — LOSARTAN POTASSIUM 50 MG PO TABS
100.0000 mg | ORAL_TABLET | Freq: Every day | ORAL | Status: DC
  Administered 2011-09-22 – 2011-09-25 (×4): 100 mg via ORAL
  Filled 2011-09-22 (×4): qty 2

## 2011-09-22 MED ORDER — EPHEDRINE SULFATE 50 MG/ML IJ SOLN
INTRAMUSCULAR | Status: DC | PRN
  Administered 2011-09-22: 10 mg via INTRAVENOUS

## 2011-09-22 MED ORDER — BISACODYL 10 MG RE SUPP: 10.0000 mg | Freq: Every day | RECTAL | Status: DC | PRN

## 2011-09-22 MED ORDER — FLEET ENEMA 7-19 GM/118ML RE ENEM: 1.0000 | ENEMA | Freq: Once | RECTAL | Status: AC | PRN

## 2011-09-22 MED ORDER — ACETAMINOPHEN 10 MG/ML IV SOLN: 1000.0000 mg | Freq: Once | INTRAVENOUS | Status: DC

## 2011-09-22 MED ORDER — SODIUM CHLORIDE 0.9 % IV SOLN
INTRAVENOUS | Status: DC
  Administered 2011-09-23: 07:00:00 via INTRAVENOUS

## 2011-09-22 MED ORDER — HYDROMORPHONE HCL PF 1 MG/ML IJ SOLN
INTRAMUSCULAR | Status: AC
  Filled 2011-09-22: qty 1

## 2011-09-22 MED ORDER — PHENOL 1.4 % MT LIQD: 1.0000 | OROMUCOSAL | Status: DC | PRN

## 2011-09-22 MED ORDER — PROMETHAZINE HCL 25 MG/ML IJ SOLN: 6.2500 mg | INTRAMUSCULAR | Status: DC | PRN

## 2011-09-22 MED ORDER — SCOPOLAMINE 1 MG/3DAYS TD PT72
MEDICATED_PATCH | TRANSDERMAL | Status: DC | PRN
  Administered 2011-09-22: 1 via TRANSDERMAL

## 2011-09-22 MED ORDER — MENTHOL 3 MG MT LOZG: 1.0000 | LOZENGE | OROMUCOSAL | Status: DC | PRN

## 2011-09-22 MED ORDER — ACETAMINOPHEN 10 MG/ML IV SOLN
1000.0000 mg | Freq: Four times a day (QID) | INTRAVENOUS | Status: DC
  Administered 2011-09-22: 1000 mg via INTRAVENOUS

## 2011-09-22 MED ORDER — SENNOSIDES-DOCUSATE SODIUM 8.6-50 MG PO TABS: 1.0000 | ORAL_TABLET | Freq: Every evening | ORAL | Status: DC | PRN

## 2011-09-22 MED ORDER — DOCUSATE SODIUM 100 MG PO CAPS
100.0000 mg | ORAL_CAPSULE | Freq: Two times a day (BID) | ORAL | Status: DC
  Administered 2011-09-22 – 2011-09-25 (×6): 100 mg via ORAL
  Filled 2011-09-22 (×6): qty 1

## 2011-09-22 MED ORDER — SCOPOLAMINE 1 MG/3DAYS TD PT72
1.0000 | MEDICATED_PATCH | Freq: Once | TRANSDERMAL | Status: DC
  Administered 2011-09-22: 1.5 mg via TRANSDERMAL

## 2011-09-22 MED ORDER — VECURONIUM BROMIDE 10 MG IV SOLR
INTRAVENOUS | Status: DC | PRN
  Administered 2011-09-22: 8 mg via INTRAVENOUS

## 2011-09-22 MED ORDER — LIDOCAINE HCL (CARDIAC) 20 MG/ML IV SOLN
INTRAVENOUS | Status: DC | PRN
  Administered 2011-09-22: 100 mg via INTRAVENOUS

## 2011-09-22 MED ORDER — METHOCARBAMOL 100 MG/ML IJ SOLN
500.0000 mg | Freq: Four times a day (QID) | INTRAVENOUS | Status: DC | PRN
  Administered 2011-09-22: 500 mg via INTRAVENOUS
  Filled 2011-09-22: qty 5

## 2011-09-22 MED ORDER — FENTANYL CITRATE 0.05 MG/ML IJ SOLN: 50.0000 ug | INTRAMUSCULAR | Status: DC | PRN

## 2011-09-22 MED ORDER — NEOSTIGMINE METHYLSULFATE 1 MG/ML IJ SOLN
INTRAMUSCULAR | Status: DC | PRN
  Administered 2011-09-22: 1.5 mg via INTRAVENOUS

## 2011-09-22 MED ORDER — OXYCODONE HCL 5 MG PO TABS
5.0000 mg | ORAL_TABLET | ORAL | Status: DC | PRN
  Administered 2011-09-23 – 2011-09-25 (×13): 10 mg via ORAL
  Filled 2011-09-22 (×13): qty 2

## 2011-09-22 MED ORDER — FENTANYL CITRATE 0.05 MG/ML IJ SOLN
INTRAMUSCULAR | Status: DC | PRN
  Administered 2011-09-22: 150 ug via INTRAVENOUS
  Administered 2011-09-22: 50 ug via INTRAVENOUS
  Administered 2011-09-22 (×2): 100 ug via INTRAVENOUS

## 2011-09-22 MED ORDER — METOCLOPRAMIDE HCL 10 MG PO TABS: 5.0000 mg | ORAL_TABLET | Freq: Three times a day (TID) | ORAL | Status: DC | PRN

## 2011-09-22 MED ORDER — SODIUM CHLORIDE 0.9 % IV SOLN: INTRAVENOUS | Status: DC

## 2011-09-22 MED ORDER — METOCLOPRAMIDE HCL 5 MG/ML IJ SOLN: 5.0000 mg | Freq: Three times a day (TID) | INTRAMUSCULAR | Status: DC | PRN

## 2011-09-22 MED ORDER — ACETAMINOPHEN 650 MG RE SUPP: 650.0000 mg | Freq: Four times a day (QID) | RECTAL | Status: DC | PRN

## 2011-09-22 MED ORDER — HYDROCHLOROTHIAZIDE 25 MG PO TABS
12.5000 mg | ORAL_TABLET | Freq: Every day | ORAL | Status: DC
  Administered 2011-09-22 – 2011-09-23 (×2): 12.5 mg via ORAL
  Filled 2011-09-22 (×4): qty 0.5

## 2011-09-22 MED ORDER — ESTRADIOL 1 MG PO TABS
1.0000 mg | ORAL_TABLET | Freq: Every day | ORAL | Status: DC
  Administered 2011-09-22 – 2011-09-25 (×4): 1 mg via ORAL
  Filled 2011-09-22 (×4): qty 1

## 2011-09-22 MED ORDER — ACETAMINOPHEN 325 MG PO TABS: 650.0000 mg | ORAL_TABLET | Freq: Four times a day (QID) | ORAL | Status: DC | PRN

## 2011-09-22 MED ORDER — PROPOFOL 10 MG/ML IV BOLUS
INTRAVENOUS | Status: DC | PRN
  Administered 2011-09-22: 200 mg via INTRAVENOUS

## 2011-09-22 MED ORDER — ATORVASTATIN CALCIUM 40 MG PO TABS
40.0000 mg | ORAL_TABLET | Freq: Every day | ORAL | Status: DC
  Administered 2011-09-22 – 2011-09-24 (×3): 40 mg via ORAL
  Filled 2011-09-22 (×4): qty 1

## 2011-09-22 MED ORDER — GLYCOPYRROLATE 0.2 MG/ML IJ SOLN
INTRAMUSCULAR | Status: DC | PRN
  Administered 2011-09-22 (×2): .2 mg via INTRAVENOUS

## 2011-09-22 MED ORDER — FENTANYL CITRATE 0.05 MG/ML IJ SOLN
50.0000 ug | INTRAMUSCULAR | Status: DC | PRN
  Administered 2011-09-22: 100 ug via INTRAVENOUS

## 2011-09-22 MED ORDER — ACETAMINOPHEN 10 MG/ML IV SOLN
INTRAVENOUS | Status: AC
  Filled 2011-09-22: qty 100

## 2011-09-22 MED ORDER — 0.9 % SODIUM CHLORIDE (POUR BTL) OPTIME
TOPICAL | Status: DC | PRN
  Administered 2011-09-22: 1000 mL

## 2011-09-22 MED ORDER — LEVOTHYROXINE SODIUM 100 MCG PO TABS
100.0000 ug | ORAL_TABLET | Freq: Every day | ORAL | Status: DC
  Administered 2011-09-23 – 2011-09-25 (×3): 100 ug via ORAL
  Filled 2011-09-22 (×4): qty 1

## 2011-09-22 MED ORDER — MIDAZOLAM HCL 2 MG/2ML IJ SOLN
1.0000 mg | INTRAMUSCULAR | Status: DC | PRN
  Administered 2011-09-22: 2 mg via INTRAVENOUS

## 2011-09-22 MED ORDER — ONDANSETRON HCL 4 MG PO TABS: 4.0000 mg | ORAL_TABLET | Freq: Four times a day (QID) | ORAL | Status: DC | PRN

## 2011-09-22 MED ORDER — SODIUM CHLORIDE 0.9 % IR SOLN
Status: DC | PRN
  Administered 2011-09-22: 3000 mL

## 2011-09-22 MED ORDER — MIDAZOLAM HCL 2 MG/2ML IJ SOLN: 1.0000 mg | INTRAMUSCULAR | Status: DC | PRN

## 2011-09-22 MED ORDER — ENOXAPARIN SODIUM 30 MG/0.3ML ~~LOC~~ SOLN
30.0000 mg | Freq: Two times a day (BID) | SUBCUTANEOUS | Status: DC
  Administered 2011-09-23 – 2011-09-25 (×5): 30 mg via SUBCUTANEOUS
  Filled 2011-09-22 (×8): qty 0.3

## 2011-09-22 SURGICAL SUPPLY — 61 items
BANDAGE ESMARK 6X9 LF (GAUZE/BANDAGES/DRESSINGS) ×1 IMPLANT
BLADE SAGITTAL 25.0X1.19X90 (BLADE) ×2 IMPLANT
BLADE SAW SAG 90X13X1.27 (BLADE) ×2 IMPLANT
BNDG ESMARK 6X9 LF (GAUZE/BANDAGES/DRESSINGS) ×2
BOWL SMART MIX CTS (DISPOSABLE) ×2 IMPLANT
CEMENT HV SMART SET (Cement) ×4 IMPLANT
CLOTH BEACON ORANGE TIMEOUT ST (SAFETY) ×2 IMPLANT
COVER BACK TABLE 24X17X13 BIG (DRAPES) IMPLANT
COVER SURGICAL LIGHT HANDLE (MISCELLANEOUS) ×2 IMPLANT
CUFF TOURNIQUET SINGLE 34IN LL (TOURNIQUET CUFF) ×2 IMPLANT
CUFF TOURNIQUET SINGLE 44IN (TOURNIQUET CUFF) IMPLANT
DRAPE INCISE IOBAN 66X45 STRL (DRAPES) IMPLANT
DRAPE ORTHO SPLIT 77X108 STRL (DRAPES) ×2
DRAPE SURG ORHT 6 SPLT 77X108 (DRAPES) ×2 IMPLANT
DRAPE U-SHAPE 47X51 STRL (DRAPES) ×2 IMPLANT
DRSG ADAPTIC 3X8 NADH LF (GAUZE/BANDAGES/DRESSINGS) ×2 IMPLANT
DRSG PAD ABDOMINAL 8X10 ST (GAUZE/BANDAGES/DRESSINGS) ×4 IMPLANT
DURAPREP 26ML APPLICATOR (WOUND CARE) ×2 IMPLANT
ELECT REM PT RETURN 9FT ADLT (ELECTROSURGICAL) ×2
ELECTRODE REM PT RTRN 9FT ADLT (ELECTROSURGICAL) ×1 IMPLANT
EVACUATOR 1/8 PVC DRAIN (DRAIN) ×2 IMPLANT
FACESHIELD LNG OPTICON STERILE (SAFETY) ×6 IMPLANT
FLOSEAL 10ML (HEMOSTASIS) IMPLANT
GLOVE BIO SURGEON STRL SZ8.5 (GLOVE) ×2 IMPLANT
GLOVE BIOGEL PI IND STRL 8 (GLOVE) ×3 IMPLANT
GLOVE BIOGEL PI INDICATOR 8 (GLOVE) ×3
GLOVE ORTHO TXT STRL SZ7.5 (GLOVE) ×2 IMPLANT
GLOVE SURG ORTHO 8.0 STRL STRW (GLOVE) ×2 IMPLANT
GLOVE SURG ORTHO 8.5 STRL (GLOVE) ×2 IMPLANT
GLOVE SURG SS PI 7.5 STRL IVOR (GLOVE) ×2 IMPLANT
GOWN PREVENTION PLUS XLARGE (GOWN DISPOSABLE) ×2 IMPLANT
GOWN PREVENTION PLUS XXLARGE (GOWN DISPOSABLE) ×4 IMPLANT
GOWN STRL NON-REIN LRG LVL3 (GOWN DISPOSABLE) IMPLANT
HANDPIECE INTERPULSE COAX TIP (DISPOSABLE) ×1
HOOD PEEL AWAY FACE SHEILD DIS (HOOD) ×2 IMPLANT
IMMOBILIZER KNEE 22 UNIV (SOFTGOODS) ×2 IMPLANT
KIT BASIN OR (CUSTOM PROCEDURE TRAY) ×2 IMPLANT
KIT ROOM TURNOVER OR (KITS) ×2 IMPLANT
MANIFOLD NEPTUNE II (INSTRUMENTS) ×2 IMPLANT
NEEDLE 22X1 1/2 (OR ONLY) (NEEDLE) IMPLANT
NS IRRIG 1000ML POUR BTL (IV SOLUTION) ×2 IMPLANT
PACK TOTAL JOINT (CUSTOM PROCEDURE TRAY) ×2 IMPLANT
PAD ARMBOARD 7.5X6 YLW CONV (MISCELLANEOUS) ×2 IMPLANT
PAD CAST 4YDX4 CTTN HI CHSV (CAST SUPPLIES) ×1 IMPLANT
PADDING CAST COTTON 4X4 STRL (CAST SUPPLIES) ×1
PADDING CAST COTTON 6X4 STRL (CAST SUPPLIES) ×2 IMPLANT
SET HNDPC FAN SPRY TIP SCT (DISPOSABLE) ×1 IMPLANT
SPONGE GAUZE 4X4 12PLY (GAUZE/BANDAGES/DRESSINGS) ×2 IMPLANT
STAPLER VISISTAT 35W (STAPLE) ×2 IMPLANT
SUCTION FRAZIER TIP 10 FR DISP (SUCTIONS) IMPLANT
SUT ETHIBOND NAB CT1 #1 30IN (SUTURE) ×6 IMPLANT
SUT VIC AB 0 CT1 27 (SUTURE) ×2
SUT VIC AB 0 CT1 27XBRD ANBCTR (SUTURE) ×2 IMPLANT
SUT VIC AB 2-0 CT1 27 (SUTURE) ×2
SUT VIC AB 2-0 CT1 TAPERPNT 27 (SUTURE) ×2 IMPLANT
SUT VLOC 180 0 24IN GS25 (SUTURE) IMPLANT
SYR CONTROL 10ML LL (SYRINGE) IMPLANT
TOWEL OR 17X24 6PK STRL BLUE (TOWEL DISPOSABLE) ×2 IMPLANT
TOWEL OR 17X26 10 PK STRL BLUE (TOWEL DISPOSABLE) ×2 IMPLANT
TRAY FOLEY CATH 14FR (SET/KITS/TRAYS/PACK) ×2 IMPLANT
WATER STERILE IRR 1000ML POUR (IV SOLUTION) ×2 IMPLANT

## 2011-09-22 NOTE — Brief Op Note (Signed)
09/22/2011  1:27 PM  PATIENT:  [name] [name]  56 y.o. female  PRE-OPERATIVE DIAGNOSIS:  osteoarthritis left knee  POST-OPERATIVE DIAGNOSIS:  osteoarthritis left knee  PROCEDURE:  Procedure(s) (LRB): TOTAL KNEE ARTHROPLASTY (Left)  SURGEON:  Surgeon(s) and Role:    * W D Carloyn Manner., MD - Primary  PHYSICIAN ASSISTANT:   ASSISTANTS: Margart Sickles, PA-C   ANESTHESIA:   regional and general  EBL:  Total I/O In: 1700 [I.V.:1700] Out: 280 [Urine:250; Blood:30]  BLOOD ADMINISTERED:none  DRAINS: hemovac left knee self suction  LOCAL MEDICATIONS USED:  NONE  SPECIMEN:  No Specimen  DISPOSITION OF SPECIMEN:  N/A  COUNTS:  YES  TOURNIQUET:   Total Tourniquet Time Documented: Thigh (Left) - 83 minutes  DICTATION: .Other Dictation: Dictation Number   PLAN OF CARE: Admit to inpatient   PATIENT DISPOSITION:  PACU - hemodynamically stable.   Delay start of Pharmacological VTE agent (>24hrs) due to surgical blood loss or risk of bleeding: yes

## 2011-09-22 NOTE — Interval H&P Note (Signed)
History and Physical Interval Note:  09/22/2011 9:51 AM  [name] [name]  has presented today for surgery, with the diagnosis of osteoarthritis left knee  The various methods of treatment have been discussed with the patient and family. After consideration of risks, benefits and other options for treatment, the patient has consented to  Procedure(s) (LRB): TOTAL KNEE ARTHROPLASTY (Left) as a surgical intervention .  The patient's history has been reviewed, patient examined, no change in status, stable for surgery.  I have reviewed the patient's chart and labs.  Questions were answered to the patient's satisfaction.     [name],W D

## 2011-09-22 NOTE — Preoperative (Addendum)
Beta Blockers   Reason not to administer Beta Blockers:Not Applicable 

## 2011-09-22 NOTE — Anesthesia Preprocedure Evaluation (Signed)
Anesthesia Evaluation  Patient identified by MRN, date of birth, ID band Patient awake    Reviewed: Allergy & Precautions, H&P , NPO status , Patient's Chart, lab work & pertinent test results  History of Anesthesia Complications (+) PONV  Airway Mallampati: III TM Distance: >3 FB Neck ROM: Full    Dental   Pulmonary sleep apnea ,  OSA sx-not dx breath sounds clear to auscultation        Cardiovascular hypertension, Rhythm:Regular Rate:Normal     Neuro/Psych Anxiety    GI/Hepatic GERD-  ,  Endo/Other  Hypothyroidism Morbid obesity  Renal/GU      Musculoskeletal  (+) Arthritis -,   Abdominal (+) + obese,   Peds  Hematology   Anesthesia Other Findings   Reproductive/Obstetrics                           Anesthesia Physical Anesthesia Plan  ASA: III  Anesthesia Plan: General   Post-op Pain Management:    Induction: Intravenous  Airway Management Planned: Oral ETT and Video Laryngoscope Planned  Additional Equipment:   Intra-op Plan:   Post-operative Plan: Extubation in OR  Informed Consent: I have reviewed the patients History and Physical, chart, labs and discussed the procedure including the risks, benefits and alternatives for the proposed anesthesia with the patient or authorized representative who has indicated his/her understanding and acceptance.     Plan Discussed with: CRNA and Surgeon  Anesthesia Plan Comments:         Anesthesia Quick Evaluation

## 2011-09-22 NOTE — Op Note (Signed)
dictated

## 2011-09-22 NOTE — Anesthesia Procedure Notes (Signed)
Procedure Name: Intubation Date/Time: 09/22/2011 10:44 AM Performed by: Marena Chancy Pre-anesthesia Checklist: Patient identified, Patient being monitored, Emergency Drugs available, Timeout performed and Suction available Patient Re-evaluated:Patient Re-evaluated prior to inductionOxygen Delivery Method: Circle system utilized Preoxygenation: Pre-oxygenation with 100% oxygen Intubation Type: IV induction Ventilation: Oral airway inserted - appropriate to patient size Tube type: Oral Tube size: 7.5 mm Number of attempts: 1 Airway Equipment and Method: Video-laryngoscopy Placement Confirmation: positive ETCO2 and breath sounds checked- equal and bilateral Secured at: 22 cm Tube secured with: Tape Dental Injury: Teeth and Oropharynx as per pre-operative assessment  Future Recommendations: Recommend- induction with short-acting agent, and alternative techniques readily available

## 2011-09-22 NOTE — Transfer of Care (Signed)
Immediate Anesthesia Transfer of Care Note  Patient: [name] [name]  Procedure(s) Performed: Procedure(s) (LRB): TOTAL KNEE ARTHROPLASTY (Left)  Patient Location: PACU  Anesthesia Type: General  Level of Consciousness: awake, alert  and oriented  Airway & Oxygen Therapy: Patient Spontanous Breathing and Patient connected to nasal cannula oxygen  Post-op Assessment: Report given to PACU RN, Post -op Vital signs reviewed and stable and Patient moving all extremities X 4  Post vital signs: Reviewed and stable  Complications: No apparent anesthesia complications

## 2011-09-22 NOTE — H&P (View-Only) (Signed)
NAME: [name] [name] MRN: #[id number] DATE: September 19, 2011 DOB: 1955/08/17  COMPLAINT:     Follow up left knee osteoarthritis.  HPI:     The patient is a 56 year old female with a history of left knee osteoarthritis, high cholesterol, hypothyroidism, and hypertension who is here today to discuss possible left total knee arthroplasty.  She has failed conservative treatments with corticosteroid injection, viscosupplementation, oral NSAIDs, oral pain medications, and also at times required the use of crutches.  The pain is now significantly affecting her quality of life and activities of daily living.   It does occasionally awaken her from sleep at night.   CURRENT MEDICATIONS:    Crestor 20 mg daily, estrogen 0.62 one to one and one-half daily, HCTZ 0.25 mg one-half daily, Synthroid 100 mg daily, losartan 100 mg daily, and sertraline 50 mg daily.     ALLERGIES:     AMPICILLIN and DOXYCYCLINE. She also had problems with nausea following anesthesia in the past.  PAST MEDICAL HISTORY:     Left knee osteoarthritis, high cholesterol, and hypothyroidism, hypertension.  HOSPITALIZATIONS:  Kidney stones around 1992.  PAST SURGICAL HISTORY:     Hysterectomy in 1998, tonsillectomy in 1998, and MOHs surgery for a basal cell 2004.  ROS:    Ten point systemic review is obtained and is positive for high blood pressure and thyroid problems; otherwise noncontributory.  Please refer to patient information form for details.  FAMILY HISTORY:    Positive in her mother for hypertension, diabetes, and cancer; father cancer; brother hypertension and diabetes.  SOCIAL HISTORY:    Nonsmoker, nondrinker. She has two living children.  She is married and lives with her husband.  She works as a Nurse, mental health.  She is currently working at this time.  She lives in a two-story residence.  EXAM:     The patient is seated in the examination room in some obvious discomfort favoring the left knee.  She is alert,  oriented, and appears appropriate age.  HT:  5'7"   WT:  255 pounds  BMI:  39.9  TEMP:  98.1 degrees  BP:  133/70    P:  78     R:  18  Skin:   No rashes, no lesions. HEENT:   PERRLA.  Neck is supple with good range of motion.   Chest/Lungs: Clear to auscultation bilaterally.  Normal breath sounds and effort.      Heart:   Regular rate and rhythm.  Does have systolic murmur. Abdomen:   Soft, non tender.  Active bowel sounds in all four quadrants. Breast:    Not indicated for surgery. Rectal: Not indicated for surgery. GU: Not indicated for surgery. Neuro:   Cranial nerves grossly intact. Musculoskeletal:    Examination of the left lower extremity shows that she is neurovascularly intact.  She has tenderness to palpation along the medial and lateral joint lines in the knee, more so on the medial aspect.  Positive patellofemoral crepitus.  Stable ligamentous testing.  Denies calf pain with palpation.  Has intact dorsiflexion and plantar flexion of the ankle.  Ambulates with an antalgic fait favoring the left lower extremity.  She has an approximately 5-10 degree deficit in full extension.  IMPRESSION:      1. Left knee osteoarthritis end-stage. 2. High cholesterol. 3. Hypothyroidism. 4. Hypertension.  RECOMMENDATIONS:     The risks and benefits of left total knee arthroplasty were discussed again today and the patient wishes to proceed.  She  has a preadmission appointment also today at [facility] with surgery currently scheduled for 09/22/11.  She does wish to go home following the surgery. Home health is currently arranged with [place].  She had obtained medical clearance from her primary care physician, Dr. Alwyn Ren.  She was given preoperative instructions.  Also discussed postoperative DVT prophylaxis and perioperative antibiotics.  She will be given clindamycin perioperatively and Lovenox after surgery.  If she has any questions or concerns prior to surgery she may contact the  office.  Josh Dorthy Magnussen P.A.-C/[id number]  Auto-Authenticated by Estanislado Spire P.A.-C

## 2011-09-22 NOTE — Anesthesia Postprocedure Evaluation (Signed)
  Anesthesia Post-op Note  Patient: [name] [name]  Procedure(s) Performed: Procedure(s) (LRB): TOTAL KNEE ARTHROPLASTY (Left)  Patient Location: PACU  Anesthesia Type: GA combined with regional for post-op pain  Level of Consciousness: awake  Airway and Oxygen Therapy: Patient Spontanous Breathing  Post-op Pain: mild  Post-op Assessment: Post-op Vital signs reviewed, Patient's Cardiovascular Status Stable, Respiratory Function Stable, Patent Airway, No signs of Nausea or vomiting and Pain level controlled  Post-op Vital Signs: stable  Complications: No apparent anesthesia complications

## 2011-09-22 NOTE — Progress Notes (Signed)
PT Cancellation Note  Treatment cancelled today due to Pt too lethargic to participate in therapy this afternoon.  Will begin pt therapy tomorrow. Marland Kitchen  [name] [name] 09/22/2011, 4:45 PM [name] L. [name] DPT [phone]

## 2011-09-22 NOTE — Progress Notes (Signed)
Orthopedic Tech Progress Note Patient Details:  [name] [name] 01/02/1956 [id number]  CPM Left Knee CPM Left Knee: On Left Knee Flexion (Degrees): 60  Left Knee Extension (Degrees): 0  Additional Comments: trapeze bar   [name], [name] 09/22/2011, 1:44 PM

## 2011-09-23 LAB — CBC
HCT: 35.3 % — ABNORMAL LOW (ref 36.0–46.0)
MCHC: 33.4 g/dL (ref 30.0–36.0)
MCV: 87.8 fL (ref 78.0–100.0)
RDW: 13.2 % (ref 11.5–15.5)

## 2011-09-23 LAB — BASIC METABOLIC PANEL
BUN: 16 mg/dL (ref 6–23)
Chloride: 99 mEq/L (ref 96–112)
Creatinine, Ser: 0.83 mg/dL (ref 0.50–1.10)
GFR calc Af Amer: 90 mL/min — ABNORMAL LOW (ref >90)
GFR calc non Af Amer: 77 mL/min — ABNORMAL LOW (ref >90)

## 2011-09-23 MED ORDER — ACETAMINOPHEN 10 MG/ML IV SOLN
1000.0000 mg | Freq: Four times a day (QID) | INTRAVENOUS | Status: AC
  Administered 2011-09-23 – 2011-09-24 (×3): 1000 mg via INTRAVENOUS
  Filled 2011-09-23 (×4): qty 100

## 2011-09-23 NOTE — Op Note (Signed)
NAME:  [name] [name], [name] [name] NO.:  [id number]  MEDICAL RECORD NO.:  [id number]  LOCATION:  [id number]                        FACILITY:  [facility]  PHYSICIAN:  Dyke Brackett, M.D.    DATE OF BIRTH:  [birth year]  DATE OF PROCEDURE:  09/22/2011 DATE OF DISCHARGE:                              OPERATIVE REPORT   INDICATIONS:  A 56 year old with end-stage arthritis of the left knee, severe pain, thought to be amenable to hospitalization replacement.  PREOPERATIVE DIAGNOSIS:  Severe osteoarthritis, left knee.  PREOPERATIVE DIAGNOSIS:  Severe osteoarthritis, left knee.  OPERATION:  Left knee total knee replacement with Sigma mobile bearing knee with 10-mm bearing size 4 femur and tibia, 38-mm patella.  SURGEON:  Dyke Brackett, MD  ASSISTANT:  Margart Sickles, PA-C  ASSISTANT:  General with nerve block.  TOURNIQUET TIME:  80 minutes.  DESCRIPTION OF PROCEDURE:  Sterile prep and drape, exsanguination of leg, inflation of tourniquet to 350.  Straight skin incision was made with medial parapatellar approach made to the knee.  We cut the distal femur with 11-mm cut due to a mild flexion contracture.  We next directed attention to the tibia with the most diseased medial compartment greater than 3-4 mm below the most diseased medial compartment and appropriate amount of valgus with a neutral cut for the Sigma knee, released the PCL.  We then sized the femur to be a size 4. We placed the internal alignment rod on the femur to set the rotation as well as the pins setting for the chamfer, anterior, posterior and femoral cutting block.  We did this with a 10-mm spacer to equalized the flexion gap to the extension gap at 10 mm.  All cuts were made.  Full extension was possible particularly after some stripping of the medial side of the knee due to the mild varus deformity.  Attention was next directed to the tibia.  Kevin Fenton was cut for the tibia.  I elected to use a standard tibia due to  the patient's young age and excellent bone quality.  Excellent size matched to a 4, which provided coverage right to the cortical edges, which was good given this patient's high BMI.  Box cut was next made for the femur.  We then trialed the femur and tibia with a 10-mm bearing.  Excellent stability was noted.  She actually only had range of motion from 10 to about 80 degrees preoperatively.  Certainly, got her fully extended and then probably could place her to 115 and 120 degrees on the table.  Posterior cleanup was done with release of the PCL, stripping the posterior structures and use of an osteotome posteriorly to remove fair amount of posterior osteophytes.  Patella was cut leaving about 16 mm of thickness patella for an all-poly patella.  All trials were then tried and deemed to be acceptable.  Trial components were removed.  The bony surfaces were irrigated.  The final prosthesis was repaired on the back table with two bags of cement. Cement was allowed to harden to the doughy state.  We then placed tibia followed by femur with a trial bearing.  Cement was allowed to harden. We placed the final patella as  well.  Once the cement was hardened, we removed the trial bearing, small amount of posterior cement.  Once we did this, we released the tourniquet.  No excessive bleeding was noted. We then placed the final bearing and a Hemovac drain.  Closure was affected with interrupted #1 Ethibond, 0, 2-0 Vicryl, skin clips, taken to the recovery room stable condition.     Dyke Brackett, M.D.     WDC/MEDQ  D:  09/22/2011  T:  09/23/2011  Job:  [id number]

## 2011-09-23 NOTE — Progress Notes (Signed)
Physical Therapy Treatment Patient Details Name: [name] [name] MRN: [id number] DOB: [birth year] Today's Date: 09/23/2011 Time: 4098-1191 PT Time Calculation (min): 17 min  PT Assessment / Plan / Recommendation Comments on Treatment Session  Pt presents with excellent strength and ROM in L knee.  Should be able to d/c to home tomorrow.     Follow Up Recommendations  Home health PT    Barriers to Discharge        Equipment Recommendations  Rolling walker with 5" wheels    Recommendations for Other Services    Frequency 7X/week   Plan Discharge plan remains appropriate;Frequency remains appropriate    Precautions / Restrictions Precautions Precautions: Knee Precaution Booklet Issued: Yes (comment) Required Braces or Orthoses: Knee Immobilizer - Left Knee Immobilizer - Left: On except when in CPM Restrictions Weight Bearing Restrictions: Yes LLE Weight Bearing: Weight bearing as tolerated   Pertinent Vitals/Pain Pt reporting 2-3/10 pain in L knee. No intervention required per pt. Placed pt in CPM at end of session 0-65 degrees.     Mobility       Exercises Total Joint Exercises Ankle Circles/Pumps: AROM;Both;10 reps;Seated Quad Sets: AROM;Strengthening;5 reps;Seated;Left Short Arc Quad: Left;10 reps;Supine Heel Slides: Left;10 reps Hip ABduction/ADduction: Left;10 reps Straight Leg Raises: Left;AROM;10 reps   PT Diagnosis:    PT Problem List:   PT Treatment Interventions:     PT Goals Acute Rehab PT Goals PT Goal Formulation: With patient Time For Goal Achievement: 09/30/11 Potential to Achieve Goals: Good Pt will go Supine/Side to Sit: with modified independence;with HOB 0 degrees Pt will Perform Home Exercise Program: Independently PT Goal: Perform Home Exercise Program - Progress: Progressing toward goal  Visit Information  Last PT Received On: 09/23/11 Assistance Needed: +1    Subjective Data  Subjective: I am a little tired Patient Stated Goal:  Return to work.    Cognition  Overall Cognitive Status: Appears within functional limits for tasks assessed/performed Arousal/Alertness: Awake/alert Orientation Level: Appears intact for tasks assessed Behavior During Session: [facility] for tasks performed    Balance     End of Session PT - End of Session Equipment Utilized During Treatment: Gait belt Activity Tolerance: Patient tolerated treatment well Patient left: in CPM;in bed;with call bell/phone within reach Nurse Communication: Mobility status   GP     Reymond Maynez 09/23/2011, 4:14 PM

## 2011-09-23 NOTE — Progress Notes (Signed)
Patient ID: [name] [name], female   DOB: Mar 20, 1955, 56 y.o.   MRN: [id number] PATIENT ID: [name] [name]        MRN:  [id number]          DOB/AGE: 1955/03/24 / 56 y.o.  [name] Campbell, MD   [name], PA-C [address] [place], [place], [place]  [address]                             [phone]   PROGRESS NOTE  Subjective:  negative for Chest Pain  negative for Shortness of Breath  negative for Nausea/Vomiting   negative for Calf Pain  negative for Bowel Movement   Tolerating Diet: yes         Patient reports pain as mild.      Objective: Vital signs in last 24 hours:   Patient Vitals for the past 24 hrs:  BP Temp Pulse Resp SpO2 Height Weight  09/23/11 0615 99/46 mmHg 97.9 F (36.6 C) 87  20  96 % - -  09/23/11 0149 124/55 mmHg 98.1 F (36.7 C) 91  18  96 % - -  09/22/11 2118 129/65 mmHg 98.1 F (36.7 C) 98  18  99 % - -  09/22/11 1551 - - - - - 5\' 7"  (1.702 m) 116 kg (255 lb 11.7 oz)  09/22/11 1456 157/69 mmHg 98.3 F (36.8 C) 94  15  99 % - -  09/22/11 1439 138/67 mmHg - - - - - -  09/22/11 1438 - - 78  13  99 % - -  09/22/11 1430 - - 80  15  98 % - -  09/22/11 1424 144/69 mmHg - - - - - -  09/22/11 1415 - - 72  14  97 % - -  09/22/11 1409 145/70 mmHg - - - - - -  09/22/11 1355 135/61 mmHg - 75  14  99 % - -  09/22/11 1345 - - 74  13  97 % - -  09/22/11 1339 129/62 mmHg - - - - - -  09/22/11 1330 - 98.2 F (36.8 C) 78  12  94 % - -  09/22/11 1324 141/69 mmHg - - - - - -      Intake/Output from previous day:   08/30 0701 - 08/31 0700 In: 1850 [I.V.:1850] Out: 680 [Urine:450; Drains:200]   Intake/Output this shift:       Intake/Output      08/30 0701 - 08/31 0700 08/31 0701 - 09/01 0700   I.V. (mL/kg) 1850 (15.9)    Total Intake(mL/kg) 1850 (15.9)    Urine (mL/kg/hr) 450 (0.2)    Drains 200    Blood 30    Total Output 680    Net +1170            LABORATORY DATA:  Basename 09/23/11 0530 09/19/11 1449  WBC 12.5* 8.8  HGB 11.8*  15.0  HCT 35.3* 45.3  PLT 288 322    Basename 09/23/11 0530 09/19/11 1449  NA 134* 140  K 4.0 4.1  CL 99 102  CO2 27 28  BUN 16 15  CREATININE 0.83 0.91  GLUCOSE 131* 92  CALCIUM 8.8 9.6   Lab Results  Component Value Date   INR 1.00 09/19/2011    Recent Radiographic Studies :  Dg Chest 2 View  09/19/2011  *RADIOLOGY REPORT*  Clinical Data: Preop for left total knee arthroplasty.  CHEST - 2 VIEW  Comparison: None  Findings: The cardiac silhouette, mediastinal and hilar contours are normal.  The lungs are clear.  No pleural effusion.  The bony thorax is intact.  IMPRESSION: Normal chest x-ray.   Original Report Authenticated By: P. Loralie Champagne, M.D.    X-ray Knee Left Port  09/22/2011  *RADIOLOGY REPORT*  Clinical Data: Knee arthroplasty.  PORTABLE LEFT KNEE - 1-2 VIEW  Comparison: None.  Findings: New left three-part total knee arthroplasty is present. Expected postoperative changes of the soft tissues.  No complicating features.  The frontal view is oblique.  IMPRESSION: New uncomplicated left three-part total knee arthroplasty.   Original Report Authenticated By: Andreas Newport, M.D.      Examination:  General appearance: alert, cooperative and no distress  Wound Exam: clean, dry, intact   Drainage:  None: wound tissue dry  Motor Exam: EHL, FHL, Anterior Tibial and Posterior Tibial Intact  Sensory Exam: Superficial Peroneal, Deep Peroneal and Tibial normal  Vascular Exam: Normal  Assessment:    1 Day Post-Op  Procedure(s) (LRB): TOTAL KNEE ARTHROPLASTY (Left)  ADDITIONAL DIAGNOSIS:  Active Problems:  * No active hospital problems. *      Plan: Physical Therapy as ordered Weight Bearing as Tolerated (WBAT)  DVT Prophylaxis:  Lovenox  DISCHARGE PLAN: Home  DISCHARGE NEEDS:   Sitting up in chair, comfortable, ready for PT, D/C IV   [name] [name] W 09/23/2011, 10:12 AM

## 2011-09-23 NOTE — Evaluation (Signed)
Physical Therapy Evaluation Patient Details Name: [name] [name] MRN: [id number] DOB: Nov 12, 1955 Today's Date: 09/23/2011 Time: 4098-1191 PT Time Calculation (min): 28 min  PT Assessment / Plan / Recommendation Clinical Impression  Pt is a 56 y/o female s/p L TKA. Pt will benefit from PT intervention to maximize functional independence for safe return to work.     PT Assessment  Patient needs continued PT services    Follow Up Recommendations  Home health PT    Barriers to Discharge None      Equipment Recommendations  Rolling walker with 5" wheels    Recommendations for Other Services     Frequency 7X/week    Precautions / Restrictions Precautions Precautions: Knee Required Braces or Orthoses: Knee Immobilizer - Left Restrictions Weight Bearing Restrictions: Yes LLE Weight Bearing: Weight bearing as tolerated   Pertinent Vitals/Pain 3/10 pain in L Knee.  Premedicated.       Mobility  Bed Mobility Bed Mobility: Supine to Sit;Sitting - Scoot to Edge of Bed Supine to Sit: 5: Supervision Sitting - Scoot to Edge of Bed: 5: Supervision Details for Bed Mobility Assistance: cues for technique.  Transfers Transfers: Sit to Stand;Stand to Sit Sit to Stand: 4: Min assist;From bed;With upper extremity assist Stand to Sit: 4: Min assist;To chair/3-in-1;With upper extremity assist Details for Transfer Assistance: cues for hand and L LE placement assist for controlled descent. Ambulation/Gait Ambulation/Gait Assistance: 4: Min guard Ambulation Distance (Feet): 80 Feet Assistive device: Rolling walker Ambulation/Gait Assistance Details: Cues for gait seqeuncing and WBAT on L LE  Gait Pattern: Step-to pattern Gait velocity: slow Wheelchair Mobility Wheelchair Mobility: No    Exercises Total Joint Exercises Ankle Circles/Pumps: AROM;Both;10 reps;Seated Quad Sets: AROM;Strengthening;5 reps;Seated;Left   PT Diagnosis: Abnormality of gait;Generalized weakness;Acute pain    PT Problem List: Decreased strength;Decreased range of motion;Decreased activity tolerance;Decreased mobility;Pain;Decreased knowledge of precautions;Decreased knowledge of use of DME PT Treatment Interventions: DME instruction;Gait training;Stair training;Functional mobility training;Therapeutic activities;Therapeutic exercise;Patient/family education;Manual techniques   PT Goals Acute Rehab PT Goals PT Goal Formulation: With patient Time For Goal Achievement: 09/30/11 Potential to Achieve Goals: Good Pt will go Supine/Side to Sit: with modified independence;with HOB 0 degrees PT Goal: Supine/Side to Sit - Progress: Goal set today Pt will go Sit to Supine/Side: with modified independence PT Goal: Sit to Supine/Side - Progress: Goal set today Pt will Transfer Bed to Chair/Chair to Bed: with modified independence PT Transfer Goal: Bed to Chair/Chair to Bed - Progress: Goal set today Pt will Ambulate: >150 feet;with modified independence;with rolling walker PT Goal: Ambulate - Progress: Goal set today Pt will Go Up / Down Stairs: 3-5 stairs;with modified independence;with least restrictive assistive device PT Goal: Up/Down Stairs - Progress: Goal set today Pt will Perform Home Exercise Program: Independently PT Goal: Perform Home Exercise Program - Progress: Goal set today  Visit Information  Last PT Received On: 09/23/11 Assistance Needed: +1    Subjective Data  Subjective: Agree to PT eval  Patient Stated Goal: Return to work    Prior Functioning  Home Living Lives With: Spouse Available Help at Discharge: Family Type of Home: House Home Access: Stairs to enter Secretary/administrator of Steps: 4 Entrance Stairs-Rails: Right;Left Home Layout: Two level;Able to live on main level with bedroom/bathroom Bathroom Shower/Tub: Walk-in Contractor: Standard Bathroom Accessibility: Yes How Accessible: Accessible via wheelchair;Accessible via walker Home Adaptive  Equipment: Bedside commode/3-in-1;Grab bars in shower;Walker - rolling Prior Function Level of Independence: Independent Able to Take Stairs?: Yes Driving: Yes  Vocation: Full time employment Communication Communication: No difficulties Dominant Hand: Right    Cognition  Overall Cognitive Status: Appears within functional limits for tasks assessed/performed Arousal/Alertness: Awake/alert Orientation Level: Appears intact for tasks assessed Behavior During Session: [facility] for tasks performed    Extremity/Trunk Assessment Right Upper Extremity Assessment RUE ROM/Strength/Tone: Within functional levels Left Upper Extremity Assessment LUE ROM/Strength/Tone: Within functional levels Right Lower Extremity Assessment RLE ROM/Strength/Tone: Within functional levels Left Lower Extremity Assessment LLE ROM/Strength/Tone: Deficits;Due to pain LLE ROM/Strength/Tone Deficits: Strength and ROM limited secondary to surgery.    Balance Balance Balance Assessed: No  End of Session PT - End of Session Equipment Utilized During Treatment: Gait belt Activity Tolerance: Patient tolerated treatment well Patient left: in chair;with call bell/phone within reach Nurse Communication: Mobility status  GP     Kathleene Bergemann 09/23/2011, 1:49 PM  Teresa Nicodemus L. Terrace Chiem DPT [phone]

## 2011-09-24 LAB — CBC
HCT: 31.5 % — ABNORMAL LOW (ref 36.0–46.0)
MCHC: 33.3 g/dL (ref 30.0–36.0)
RDW: 13.3 % (ref 11.5–15.5)

## 2011-09-24 MED ORDER — OXYCODONE-ACETAMINOPHEN 5-325 MG PO TABS: 1.0000 | ORAL_TABLET | ORAL | Status: AC | PRN

## 2011-09-24 MED ORDER — ENOXAPARIN SODIUM 30 MG/0.3ML ~~LOC~~ SOLN: 30.0000 mg | Freq: Two times a day (BID) | SUBCUTANEOUS | Status: DC

## 2011-09-24 MED ORDER — HYDROCHLOROTHIAZIDE 12.5 MG PO CAPS
12.5000 mg | ORAL_CAPSULE | Freq: Every day | ORAL | Status: DC
  Administered 2011-09-24 – 2011-09-25 (×2): 12.5 mg via ORAL
  Filled 2011-09-24 (×3): qty 1

## 2011-09-24 MED ORDER — METHOCARBAMOL 500 MG PO TABS: 500.0000 mg | ORAL_TABLET | Freq: Four times a day (QID) | ORAL | Status: AC | PRN

## 2011-09-24 NOTE — Progress Notes (Signed)
Physical Therapy Treatment Patient Details Name: [name] [name] MRN: [id number] DOB: Sep 24, 1955 Today's Date: 09/24/2011 Time: 4098-1191 PT Time Calculation (min): 42 min  PT Assessment / Plan / Recommendation Comments on Treatment Session  Placed pt in CPM at 11:00 am.      Follow Up Recommendations  Home health PT    Barriers to Discharge        Equipment Recommendations  Rolling walker with 5" wheels    Recommendations for Other Services    Frequency 7X/week   Plan Discharge plan remains appropriate;Frequency remains appropriate    Precautions / Restrictions Precautions Precautions: Knee Precaution Booklet Issued: Yes (comment) Required Braces or Orthoses: Knee Immobilizer - Left Knee Immobilizer - Left: On except when in CPM Restrictions Weight Bearing Restrictions: No LLE Weight Bearing: Weight bearing as tolerated   Pertinent Vitals/Pain Pt c/o posterior knee pain when L LE in terminal knee extension.  No intervention required.     Mobility  Bed Mobility Bed Mobility: Supine to Sit;Sitting - Scoot to Edge of Bed;Sit to Supine Supine to Sit: 6: Modified independent (Device/Increase time) Sitting - Scoot to Edge of Bed: 6: Modified independent (Device/Increase time) Sit to Supine: 6: Modified independent (Device/Increase time) Details for Bed Mobility Assistance: Pt using R LE to aid L LE Transfers Transfers: Sit to Stand;Stand to Sit Sit to Stand: 7: Independent Stand to Sit: 7: Independent Details for Transfer Assistance: No assist or cueing required  Ambulation/Gait Ambulation/Gait Assistance: 5: Supervision Ambulation Distance (Feet): 200 Feet Assistive device: Rolling walker Ambulation/Gait Assistance Details: Instructed pt in reciprocal gait, cueing to flex L knee in L LE swing phase.  Gait Pattern: Step-to pattern Gait velocity: improving Stairs: Yes Stairs Assistance: 5: Supervision Stairs Assistance Details (indicate cue type and reason): cueing  for hand placement on rail and sequencing.  Stair Management Technique: One rail Right;Step to pattern Number of Stairs: 4  Wheelchair Mobility Wheelchair Mobility: No    Exercises     PT Diagnosis:    PT Problem List:   PT Treatment Interventions:     PT Goals Acute Rehab PT Goals PT Goal Formulation: With patient Time For Goal Achievement: 09/30/11 Potential to Achieve Goals: Good Pt will go Supine/Side to Sit: with modified independence;with HOB 0 degrees PT Goal: Supine/Side to Sit - Progress: Met Pt will go Sit to Supine/Side: with modified independence PT Goal: Sit to Supine/Side - Progress: Met Pt will Transfer Bed to Chair/Chair to Bed: with modified independence PT Transfer Goal: Bed to Chair/Chair to Bed - Progress: Met Pt will Ambulate: >150 feet;with modified independence;with rolling walker PT Goal: Ambulate - Progress: Met Pt will Go Up / Down Stairs: 3-5 stairs;with modified independence;with least restrictive assistive device PT Goal: Up/Down Stairs - Progress: Progressing toward goal Pt will Perform Home Exercise Program: Independently  Visit Information  Last PT Received On: 09/24/11 Assistance Needed: +1    Subjective Data  Patient Stated Goal: Return to work.    Cognition  Overall Cognitive Status: Appears within functional limits for tasks assessed/performed Arousal/Alertness: Awake/alert Orientation Level: Appears intact for tasks assessed Behavior During Session: [facility] for tasks performed    Balance     End of Session PT - End of Session Equipment Utilized During Treatment: Gait belt Activity Tolerance: Patient tolerated treatment well Patient left: in CPM;in bed;with call bell/phone within reach Nurse Communication: Mobility status CPM Left Knee CPM Left Knee: On Left Knee Flexion (Degrees): 60  Left Knee Extension (Degrees): 0  Additional Comments: trapeze  bar   GP     [name] [name] 09/24/2011, 11:10 AM [name] [name] L. Jontavia Leatherbury DPT [phone]

## 2011-09-24 NOTE — Progress Notes (Signed)
Occupational Therapy Evaluation Patient Details Name: [name] [name] MRN: [id number] DOB: 12/13/1955 Today's Date: 09/24/2011 Time: 0925-     OT Assessment / Plan / Recommendation Clinical Impression  56 yo s/p L TKA. Pt making excellent progress. Completed all education regarding ADL and mobility for ADL. No further OT needed. No further equipment needs.    OT Assessment  Patient does not need any further OT services    Follow Up Recommendations  No OT follow up    Barriers to Discharge      Equipment Recommendations  Rolling walker with 5" wheels    Recommendations for Other Services    Frequency       Precautions / Restrictions Precautions Precautions: Knee Precaution Booklet Issued: Yes (comment) Required Braces or Orthoses: Knee Immobilizer - Left Knee Immobilizer - Left: On except when in CPM Restrictions Weight Bearing Restrictions: No LLE Weight Bearing: Weight bearing as tolerated   Pertinent Vitals/Pain 4    ADL  Grooming: Performed;Modified independent Where Assessed - Grooming: Unsupported standing Upper Body Bathing: Simulated;Modified independent Where Assessed - Upper Body Bathing: Unsupported sit to stand Lower Body Bathing: Simulated;Minimal assistance Where Assessed - Lower Body Bathing: Unsupported sit to stand Upper Body Dressing: Simulated;Modified independent Where Assessed - Upper Body Dressing: Unsupported sitting Lower Body Dressing: Simulated;Minimal assistance Where Assessed - Lower Body Dressing: Unsupported sit to stand Toilet Transfer: Performed;Supervision/safety Toilet Transfer Method: Sit to stand;Stand pivot Acupuncturist: Bedside commode;Other (comment) (over toilet) Toileting - Clothing Manipulation and Hygiene: Performed;Independent Where Assessed - Toileting Clothing Manipulation and Hygiene: Standing Tub/Shower Transfer: Engineer, manufacturing Method: Land: Other (comment) (3 in 1) Equipment Used: Knee Immobilizer;Rolling walker;Reacher;Long-handled sponge;Sock aid Transfers/Ambulation Related to ADLs: Mod I RW level    OT Diagnosis:    OT Problem List:   OT Treatment Interventions:     OT Goals Acute Rehab OT Goals OT Goal Formulation:  (eval only)  Visit Information  Last OT Received On: 09/24/11 Assistance Needed: +1    Subjective Data      Prior Functioning  Vision/Perception  Home Living Lives With: Spouse Available Help at Discharge: Family Type of Home: House Home Access: Stairs to enter Secretary/administrator of Steps: 4 Entrance Stairs-Rails: Right;Left Home Layout: Two level;Able to live on main level with bedroom/bathroom Bathroom Shower/Tub: Walk-in Contractor: Standard Bathroom Accessibility: Yes How Accessible: Accessible via wheelchair;Accessible via walker Home Adaptive Equipment: Bedside commode/3-in-1;Grab bars in shower;Walker - rolling Prior Function Level of Independence: Independent Able to Take Stairs?: Yes Driving: Yes Vocation: Full time employment Comments: [employer] Communication Communication: No difficulties Dominant Hand: Right      Cognition  Overall Cognitive Status: Appears within functional limits for tasks assessed/performed Arousal/Alertness: Awake/alert Orientation Level: Appears intact for tasks assessed Behavior During Session: [facility] for tasks performed    Extremity/Trunk Assessment Right Upper Extremity Assessment RUE ROM/Strength/Tone: Within functional levels Left Upper Extremity Assessment LUE ROM/Strength/Tone: Within functional levels Trunk Assessment Trunk Assessment: Normal   Mobility  Shoulder Instructions  Bed Mobility Bed Mobility: Supine to Sit;Sitting - Scoot to Edge of Bed;Sit to Supine Supine to Sit: 6: Modified independent (Device/Increase time) Sitting - Scoot to Edge of Bed: 6: Modified independent (Device/Increase time) Sit  to Supine: 6: Modified independent (Device/Increase time) Details for Bed Mobility Assistance: Pt using R LE to aid L LE Transfers Transfers: Sit to Stand;Stand to Sit Sit to Stand: 7: Independent Stand to Sit: 7: Independent Details for Transfer Assistance: No assist or  cueing required        Exercise     Balance  WFL   End of Session OT - End of Session Equipment Utilized During Treatment: Gait belt;Left knee immobilizer Activity Tolerance: Patient tolerated treatment well Patient left: in chair;with call bell/phone within reach Nurse Communication: Mobility status CPM Left Knee CPM Left Knee: On Left Knee Flexion (Degrees): 60  Left Knee Extension (Degrees): 0  Additional Comments: trapeze bar  GO     [name] [name],[name] [name] 09/24/2011, 11:15 AM [name] [name], OTR/L  [phone] 09/24/2011

## 2011-09-24 NOTE — Progress Notes (Signed)
Subjective: Doing well. Making good progress with therapy.     Objective: Vital signs in last 24 hours: Temp:  [98.6 F (37 C)-100 F (37.8 C)] 98.8 F (37.1 C) (09/01 0530) Pulse Rate:  [72-93] 72  (09/01 0530) Resp:  [18] 18  (09/01 0530) BP: (110-142)/(53-72) 119/53 mmHg (09/01 0530) SpO2:  [93 %-97 %] 93 % (09/01 0530)  Intake/Output from previous day: 08/31 0701 - 09/01 0700 In: 800 [P.O.:800] Out: -  Intake/Output this shift: Total I/O In: 240 [P.O.:240] Out: -    Basename 09/24/11 0625 09/23/11 0530  HGB 10.5* 11.8*    Basename 09/24/11 0625 09/23/11 0530  WBC 10.7* 12.5*  RBC 3.56* 4.02  HCT 31.5* 35.3*  PLT 252 288    Basename 09/23/11 0530  NA 134*  K 4.0  CL 99  CO2 27  BUN 16  CREATININE 0.83  GLUCOSE 131*  CALCIUM 8.8   No results found for this basename: LABPT:2,INR:2 in the last 72 hours Exam:  Wound looks good. Staples intact.  No drainage or signs of infection.  Calf nt, nvi.  Assessment/Plan: Anticipate d/c home tomorrow.  D/c dilaudid and iv.     [name] [name] 09/24/2011, 11:32 AM

## 2011-09-24 NOTE — Progress Notes (Signed)
CSW consulted for SNF and chart reviewed. PT recommendation for HHPT noted. CSW signing off as no other CSW needs identified at this time. Please re-consult if SNF needed.  Dellie Burns, MSW, LCSWA [phone] (Weekends 8:00am-4:30pm)

## 2011-09-25 LAB — CBC
HCT: 31.3 % — ABNORMAL LOW (ref 36.0–46.0)
Hemoglobin: 10.4 g/dL — ABNORMAL LOW (ref 12.0–15.0)
RBC: 3.57 MIL/uL — ABNORMAL LOW (ref 3.87–5.11)
WBC: 11.1 10*3/uL — ABNORMAL HIGH (ref 4.0–10.5)

## 2011-09-25 MED ORDER — MAGNESIUM CITRATE PO SOLN
1.0000 | Freq: Once | ORAL | Status: AC
  Administered 2011-09-25: 1 via ORAL
  Filled 2011-09-25: qty 296

## 2011-09-25 MED ORDER — OXYCODONE-ACETAMINOPHEN 5-325 MG PO TABS: 1.0000 | ORAL_TABLET | ORAL | Status: AC | PRN

## 2011-09-25 MED ORDER — ENOXAPARIN SODIUM 30 MG/0.3ML ~~LOC~~ SOLN: 30.0000 mg | Freq: Two times a day (BID) | SUBCUTANEOUS | Status: DC

## 2011-09-25 MED ORDER — METHOCARBAMOL 500 MG PO TABS: 500.0000 mg | ORAL_TABLET | Freq: Four times a day (QID) | ORAL | Status: AC | PRN

## 2011-09-25 NOTE — Progress Notes (Signed)
CARE MANAGEMENT NOTE 09/25/2011  Patient:  [name] [name] DUNN   Account Number:  [id number]  Date Initiated:  09/25/2011  Documentation initiated by:  Bakari Nikolai,Jacynda  Subjective/Objective Assessment:   56 yr old female s/p left total knee arthroplasty     Action/Plan:   Cm spoke with patient and husband regarding home health needs at discharge. Patient preoperatively setup with [facility], no changes. Rolling walker, CPM and 3in1 have been delivered to the home.   Anticipated DC Date:  09/25/2011   Anticipated DC Plan:  HOME W HOME HEALTH SERVICES      DC Planning Services  CM consult      PAC Choice  HOME HEALTH   Choice offered to / List presented to:  C-1 Patient        HH arranged  HH-2 PT      HH agency  [facility]   Status of service:  Completed, signed off Medicare Important Message given?   (If response is "NO", the following Medicare IM given date fields will be blank) Date Medicare IM given:   Date Additional Medicare IM given:    Discharge Disposition:  HOME W HOME HEALTH SERVICES  Per UR Regulation:    If discussed at Long Length of Stay Meetings, dates discussed:    Comments:     

## 2011-09-25 NOTE — Progress Notes (Signed)
CARE MANAGEMENT NOTE 09/25/2011  Patient:  ILY, DENNO   Account Number:  [id number]  Date Initiated:  09/25/2011  Documentation initiated by:  Vance Peper  Subjective/Objective Assessment:   56 yr old female s/p left total knee arthroplasty     Action/Plan:   Cm spoke with patient and husband regarding home health needs at discharge. Patient preoperatively setup with [facility], no changes. Rolling walker, CPM and 3in1 have been delivered to the home.   Anticipated DC Date:  09/25/2011   Anticipated DC Plan:  HOME W HOME HEALTH SERVICES      DC Planning Services  CM consult      [facility] Choice  HOME HEALTH   Choice offered to / List presented to:  C-1 Patient        HH arranged  HH-2 PT      [facility] agency  [facility]   Status of service:  Completed, signed off Medicare Important Message given?   (If response is "NO", the following Medicare IM given date fields will be blank) Date Medicare IM given:   Date Additional Medicare IM given:    Discharge Disposition:  HOME W HOME HEALTH SERVICES  Per UR Regulation:    If discussed at Long Length of Stay Meetings, dates discussed:    Comments:

## 2011-09-25 NOTE — Progress Notes (Signed)
Subjective: Doing well.  Wants to go home   Objective: Vital signs in last 24 hours: Temp:  [98.9 F (37.2 C)-100.2 F (37.9 C)] 99 F (37.2 C) (09/02 0552) Pulse Rate:  [91-98] 91  (09/02 0552) Resp:  [18] 18  (09/02 0552) BP: (122-138)/(52-56) 122/54 mmHg (09/02 0552) SpO2:  [94 %-97 %] 97 % (09/02 0552)  Intake/Output from previous day: 09/01 0701 - 09/02 0700 In: 780 [P.O.:480; IV Piggyback:300] Out: -  Intake/Output this shift:     Basename 09/25/11 0544 09/24/11 0625 09/23/11 0530  HGB 10.4* 10.5* 11.8*    Basename 09/25/11 0544 09/24/11 0625  WBC 11.1* 10.7*  RBC 3.57* 3.56*  HCT 31.3* 31.5*  PLT 253 252    Basename 09/23/11 0530  NA 134*  K 4.0  CL 99  CO2 27  BUN 16  CREATININE 0.83  GLUCOSE 131*  CALCIUM 8.8   No results found for this basename: LABPT:2,INR:2 in the last 72 hours  Exam:  Wound looks good.  Staples intact.  No drainage or signs of infection. Nvi.  Calf nontender.  Assessment/Plan: D/c home today after has a bowel movement.  F/u dr Madelon Lips as scheduled.   OWENS,JAMES M 09/25/2011, 12:14 PM

## 2011-09-25 NOTE — Progress Notes (Signed)
Physical Therapy Treatment Patient Details Name: [name] [name] MRN: [id number] DOB: 1955-10-03 Today's Date: 09/25/2011 Time: 0832-0907 PT Time Calculation (min): 35 min  PT Assessment / Plan / Recommendation Comments on Treatment Session  Placed pt in CPM at 9:15 am    Follow Up Recommendations  Home health PT    Barriers to Discharge        Equipment Recommendations  None recommended by PT    Recommendations for Other Services    Frequency 7X/week   Plan Discharge plan remains appropriate;Frequency remains appropriate    Precautions / Restrictions Precautions Precautions: Knee Precaution Booklet Issued: Yes (comment) Required Braces or Orthoses: Knee Immobilizer - Left Knee Immobilizer - Left: On except when in CPM Restrictions LLE Weight Bearing: Weight bearing as tolerated   Pertinent Vitals/Pain 2-5/10 L knee. Premedicated, applied ice and repositioned, performed grade 2 A-P knee mobilizations for pain relief. Pt reporting no pain after manual therapy.      Mobility  Bed Mobility Bed Mobility: Supine to Sit;Sitting - Scoot to [place] of Bed;Sit to Supine Supine to Sit: 6: Modified independent (Device/Increase time) Sitting - Scoot to Edge of Bed: 6: Modified independent (Device/Increase time) Sit to Supine: 6: Modified independent (Device/Increase time) Details for Bed Mobility Assistance: Pt able to use leg lifter or R LE to assist L LE.   Transfers Transfers: Sit to Stand;Stand to Sit Sit to Stand: 7: Independent Stand to Sit: 7: Independent Details for Transfer Assistance: No assist or cueing required  Ambulation/Gait Ambulation/Gait Assistance: 6: Modified independent (Device/Increase time) Ambulation Distance (Feet): 200 Feet Assistive device: Rolling walker Ambulation/Gait Assistance Details: No instruction required Gait Pattern: Step-to pattern Gait velocity: WFL  Stairs: No Wheelchair Mobility Wheelchair Mobility: No    Exercises Total Joint  Exercises Ankle Circles/Pumps: AROM;Both;10 reps;Seated Quad Sets: AROM;Strengthening;5 reps;Seated;Left Short Arc Quad: Left;10 reps;Supine Heel Slides: Left;10 reps Hip ABduction/ADduction: Left;10 reps Straight Leg Raises: Left;AROM;10 reps Goniometric ROM: 0-86 degrees in sitting.    PT Diagnosis:    PT Problem List:   PT Treatment Interventions:     PT Goals Acute Rehab PT Goals PT Goal Formulation: With patient Time For Goal Achievement: 09/30/11 Potential to Achieve Goals: Good Pt will go Supine/Side to Sit: with modified independence;with HOB 0 degrees PT Goal: Supine/Side to Sit - Progress: Met Pt will go Sit to Supine/Side: with modified independence PT Goal: Sit to Supine/Side - Progress: Met Pt will Transfer Bed to Chair/Chair to Bed: with modified independence PT Transfer Goal: Bed to Chair/Chair to Bed - Progress: Met Pt will Ambulate: >150 feet;with modified independence;with rolling walker PT Goal: Ambulate - Progress: Met Pt will Go Up / Down Stairs: 3-5 stairs;with modified independence;with least restrictive assistive device PT Goal: Up/Down Stairs - Progress: Met Pt will Perform Home Exercise Program: Independently PT Goal: Perform Home Exercise Program - Progress: Met  Visit Information  Last PT Received On: 09/25/11    Subjective Data  Subjective: I am ready to go home  Patient Stated Goal: Return to work   Cognition  Overall Cognitive Status: Appears within functional limits for tasks assessed/performed Arousal/Alertness: Awake/alert Orientation Level: Appears intact for tasks assessed Behavior During Session: [facility] for tasks performed    Balance     End of Session PT - End of Session Equipment Utilized During Treatment: Gait belt;Left knee immobilizer Activity Tolerance: Patient tolerated treatment well Patient left: in bed;in CPM;with call bell/phone within reach Nurse Communication: Mobility status   GP     [name] [name] 09/25/2011,  12:01  PM [name] [name] L. Sabine Tenenbaum DPT [phone]

## 2011-09-26 ENCOUNTER — Encounter (HOSPITAL_COMMUNITY): Payer: Self-pay | Admitting: Orthopedic Surgery

## 2011-10-12 NOTE — Discharge Summary (Signed)
PATIENT ID: [name] [name]        MRN:  [id number]          DOB/AGE: 01/27/1955 / 56 y.o.    DISCHARGE SUMMARY  ADMISSION DATE:    09/22/2011 DISCHARGE DATE:   09/25/11  ADMISSION DIAGNOSIS: osteoarthritis left knee    DISCHARGE DIAGNOSIS:  osteoarthritis left knee    ADDITIONAL DIAGNOSIS: Active Problems:  * No active hospital problems. *   Past Medical History  Diagnosis Date  . Hyperlipemia   . Hypertension   . Hypothyroidism   . Cholelithiasis   . Hepatic steatosis   . PONV (postoperative nausea and vomiting)   . Arthritis     PROCEDURE: Procedure(s): TOTAL KNEE ARTHROPLASTY Left  on 09/22/2011  CONSULTS:     HISTORY:  See H&P in chart  HOSPITAL COURSE:  [name] [name] is a 56 y.o. admitted on 09/22/2011 and found to have a diagnosis of osteoarthritis left knee.  After appropriate laboratory studies were obtained  they were taken to the operating room on 09/22/2011 and underwent Procedure(s): TOTAL KNEE ARTHROPLASTY Left.   They were given perioperative antibiotics:  Anti-infectives     Start     Dose/Rate Route Frequency Ordered Stop   09/22/11 1700   clindamycin (CLEOCIN) IVPB 600 mg        600 mg 100 mL/hr over 30 Minutes Intravenous Every 6 hours 09/22/11 1527 09/23/11 0354   09/21/11 1410   clindamycin (CLEOCIN) IVPB 900 mg        900 mg 100 mL/hr over 30 Minutes Intravenous 60 min pre-op 09/21/11 1410 09/22/11 1039        .  Tolerated the procedure well.  Placed with a foley intraoperatively.  Given Ofirmev at induction and for 48 hours.    POD #1, allowed out of bed to a chair.  PT for ambulation and exercise program.  Foley D/C'd in morning.  IV saline locked.  O2 discontionued.  POD #2, continued PT and ambulation.   Hemovac pulled. .  The remainder of the hospital course was dedicated to ambulation and strengthening.   The patient was discharged on 20 Days Post-Op in  Stable condition.  Blood products given:none  DIAGNOSTIC  STUDIES: Recent vital signs: No data found.      Recent laboratory studies: No results found for this basename: WBC:7,HGB:7,HCT:7,PLT:7 in the last 168 hours No results found for this basename: NA:7,K:7,CL:7,CO2:7,BUN:7,CREATININE:7,GLUCOSE:7,CALCIUM:7 in the last 168 hours Lab Results  Component Value Date   INR 1.00 09/19/2011     Recent Radiographic Studies :  Dg Chest 2 View  09/19/2011  *RADIOLOGY REPORT*  Clinical Data: Preop for left total knee arthroplasty.  CHEST - 2 VIEW  Comparison: None  Findings: The cardiac silhouette, mediastinal and hilar contours are normal.  The lungs are clear.  No pleural effusion.  The bony thorax is intact.  IMPRESSION: Normal chest x-ray.   Original Report Authenticated By: P. Loralie Champagne, M.D.    X-ray Knee Left Port  09/22/2011  *RADIOLOGY REPORT*  Clinical Data: Knee arthroplasty.  PORTABLE LEFT KNEE - 1-2 VIEW  Comparison: None.  Findings: New left three-part total knee arthroplasty is present. Expected postoperative changes of the soft tissues.  No complicating features.  The frontal view is oblique.  IMPRESSION: New uncomplicated left three-part total knee arthroplasty.   Original Report Authenticated By: Andreas Newport, M.D.     DISCHARGE INSTRUCTIONS: Discharge Orders    Future Appointments: Provider: Department: Dept Phone: Center:  10/16/2011 5:00 PM Tomma Lightning, PT [facility] [phone] [facility]     Future Orders Please Complete By Expires   Diet - low sodium heart healthy      Call MD / Call 911      Comments:   If you experience chest pain or shortness of breath, CALL 911 and be transported to the hospital emergency room.  If you develope a fever above 101 F, pus (white drainage) or increased drainage or redness at the wound, or calf pain, call your surgeon's office.   Constipation Prevention      Comments:   Drink plenty of fluids.  Prune juice may be helpful.  You may use a stool softener, such as Colace (over the counter)  100 mg twice a day.  Use MiraLax (over the counter) for constipation as needed.   Increase activity slowly as tolerated      Discharge instructions      Comments:   Ok to shower, but no tub soaking.  Do not apply any creams or ointments to incision.  Continue physical therapy protocol.   Driving restrictions      Comments:   No driving until further notice.   CPM      Comments:   Continuous passive motion machine (CPM):      Use the CPM from 0 to 70 degrees for 6-8 hours per day.      You may increase by 10 degrees per day as tolerated.  You may break it up into 2 or 3 sessions per day.      Use CPM for 3-4 weeks or until you are told to stop.   TED hose      Comments:   Use stockings (TED hose) for 3-4 weeks on both leg(s).  You may remove them at night for sleeping.   Change dressing      Comments:   Change dressing on left knee daily with sterile 4 x 4 inch gauze dressing and apply TED hose.   Do not put a pillow under the knee. Place it under the heel.         DISCHARGE MEDICATIONS:     Medication List     As of 10/12/2011  8:57 PM    STOP taking these medications         HYDROcodone-acetaminophen 5-325 MG per tablet   Commonly known as: NORCO/VICODIN      meloxicam 15 MG tablet   Commonly known as: MOBIC      multivitamin with minerals Tabs      TAKE these medications         enoxaparin 30 MG/0.3ML injection   Commonly known as: LOVENOX   Inject 0.3 mLs (30 mg total) into the skin every 12 (twelve) hours.      enoxaparin 30 MG/0.3ML injection   Commonly known as: LOVENOX   Inject 0.3 mLs (30 mg total) into the skin every 12 (twelve) hours.      estradiol 1 MG tablet   Commonly known as: ESTRACE   Take 1 mg by mouth daily.      hydrochlorothiazide 25 MG tablet   Commonly known as: HYDRODIURIL   Take 12.5 mg by mouth daily.      levothyroxine 100 MCG tablet   Commonly known as: SYNTHROID, LEVOTHROID   Take 100 mcg by mouth daily.      losartan 100 MG  tablet   Commonly known as: COZAAR   Take 100 mg by mouth  daily.      rosuvastatin 20 MG tablet   Commonly known as: CRESTOR   Take 20 mg by mouth daily.      sertraline 50 MG tablet   Commonly known as: ZOLOFT   Take 50 mg by mouth daily.        FOLLOW UP VISIT:       Follow-up Information    Call CAFFREY JR,W D, MD. (to make appt on 10/05/11 new office number [phone].)    Contact information:   Tracey Harries, [facility] [address] [place] [address] [phone]          DISPOSITION: Home CONDITION:  {Stable  Margart Sickles 10/12/2011, 8:57 PM

## 2011-10-13 ENCOUNTER — Telehealth: Payer: Self-pay | Admitting: Internal Medicine

## 2011-10-13 NOTE — Telephone Encounter (Signed)
Dr.Hopper aware, I called patient and informed her if Chest pain or SOB associated with symptoms, patient needs to seen in Emergency room or Urgent care. Patient agreed

## 2011-10-13 NOTE — Telephone Encounter (Signed)
Caller: Jniya/Patient; Patient Name: [name] [name]; PCP: Marga Melnick; Best Callback Phone Number: [phone]; Reason for call: Having panic attacks--gets very nervous, but can not seem to focus.  Feels shaky.Emotional.   Episodes happen 2-3 times per day; Onset of these was 10/10/11.  Is not eating well.  Elimination is normal for patient.  Being house bound is very hard.  Triaged using Anxiety with a disposition to see provider within 24 hours due to feeling overwhelmed and unable to calm down.  Care advice given.  No appointments available with Dr. Francene Boyers appointment with any other provider.  Stated will see how she feels on Monday and call back if needed.

## 2011-10-14 ENCOUNTER — Emergency Department (HOSPITAL_BASED_OUTPATIENT_CLINIC_OR_DEPARTMENT_OTHER)
Admission: EM | Admit: 2011-10-14 | Discharge: 2011-10-14 | Disposition: A | Discharge status: Home / Self Care | Payer: BC Managed Care – PPO | Attending: Emergency Medicine | Admitting: Emergency Medicine

## 2011-10-14 ENCOUNTER — Encounter (HOSPITAL_BASED_OUTPATIENT_CLINIC_OR_DEPARTMENT_OTHER): Payer: Self-pay | Admitting: Family Medicine

## 2011-10-14 DIAGNOSIS — I10 Essential (primary) hypertension: Secondary | ICD-10-CM | POA: Insufficient documentation

## 2011-10-14 DIAGNOSIS — Z833 Family history of diabetes mellitus: Secondary | ICD-10-CM | POA: Insufficient documentation

## 2011-10-14 DIAGNOSIS — Z801 Family history of malignant neoplasm of trachea, bronchus and lung: Secondary | ICD-10-CM | POA: Insufficient documentation

## 2011-10-14 DIAGNOSIS — F419 Anxiety disorder, unspecified: Secondary | ICD-10-CM

## 2011-10-14 DIAGNOSIS — Z8249 Family history of ischemic heart disease and other diseases of the circulatory system: Secondary | ICD-10-CM | POA: Insufficient documentation

## 2011-10-14 DIAGNOSIS — Z6379 Other stressful life events affecting family and household: Secondary | ICD-10-CM | POA: Insufficient documentation

## 2011-10-14 DIAGNOSIS — Z881 Allergy status to other antibiotic agents status: Secondary | ICD-10-CM | POA: Insufficient documentation

## 2011-10-14 DIAGNOSIS — Z806 Family history of leukemia: Secondary | ICD-10-CM | POA: Insufficient documentation

## 2011-10-14 DIAGNOSIS — F411 Generalized anxiety disorder: Secondary | ICD-10-CM | POA: Insufficient documentation

## 2011-10-14 LAB — BASIC METABOLIC PANEL
BUN: 18 mg/dL (ref 6–23)
CO2: 26 mEq/L (ref 19–32)
Calcium: 9.2 mg/dL (ref 8.4–10.5)
GFR calc non Af Amer: 81 mL/min — ABNORMAL LOW (ref >90)
Glucose, Bld: 138 mg/dL — ABNORMAL HIGH (ref 70–99)
Sodium: 138 mEq/L (ref 135–145)

## 2011-10-14 LAB — URINALYSIS, ROUTINE W REFLEX MICROSCOPIC
Bilirubin Urine: NEGATIVE
Glucose, UA: NEGATIVE mg/dL
Hgb urine dipstick: NEGATIVE
Protein, ur: NEGATIVE mg/dL
Specific Gravity, Urine: 1.023 (ref 1.005–1.030)

## 2011-10-14 LAB — CBC WITH DIFFERENTIAL/PLATELET
Eosinophils Absolute: 0.2 10*3/uL (ref 0.0–0.7)
Eosinophils Relative: 3 % (ref 0–5)
HCT: 36.1 % (ref 36.0–46.0)
Hemoglobin: 12 g/dL (ref 12.0–15.0)
Lymphocytes Relative: 25 % (ref 12–46)
Lymphs Abs: 1.5 10*3/uL (ref 0.7–4.0)
MCH: 29.1 pg (ref 26.0–34.0)
MCV: 87.4 fL (ref 78.0–100.0)
Monocytes Relative: 7 % (ref 3–12)
Platelets: 510 10*3/uL — ABNORMAL HIGH (ref 150–400)
RBC: 4.13 MIL/uL (ref 3.87–5.11)
WBC: 6.1 10*3/uL (ref 4.0–10.5)

## 2011-10-14 MED ORDER — LORAZEPAM 1 MG PO TABS
1.0000 mg | ORAL_TABLET | Freq: Once | ORAL | Status: AC
  Administered 2011-10-14: 1 mg via ORAL
  Filled 2011-10-14: qty 1

## 2011-10-14 MED ORDER — ONDANSETRON 4 MG PO TBDP
4.0000 mg | ORAL_TABLET | Freq: Once | ORAL | Status: AC
  Administered 2011-10-14: 4 mg via ORAL
  Filled 2011-10-14: qty 1

## 2011-10-14 MED ORDER — LORAZEPAM 1 MG PO TABS: 1.0000 mg | ORAL_TABLET | Freq: Three times a day (TID) | ORAL | Status: DC | PRN

## 2011-10-14 NOTE — ED Notes (Addendum)
Pt reported a tingling feeling generalized over her body and feeling nauseated. Coached patient in slow/deep breathing and visualization/relaxation.  Pt. States feeling better but still mildly nauseated.

## 2011-10-14 NOTE — ED Notes (Signed)
Bed adjusted and warm blanket given to patient.  Reminded that we still need a UA sample to complete her lab orders.

## 2011-10-14 NOTE — ED Notes (Addendum)
Pt. C/o feeling very anxious beginning yesterday morning.  Pt. Appears diaphoretic and trembling.  Pt. Reports having knee replacement surgery three weeks ago and states she recently "cut down" on her prescribed pain medication (per patient - Hydrocodone).  Pt. Reports having decreased appetite since her surgery.

## 2011-10-14 NOTE — ED Provider Notes (Signed)
History     CSN: [id number]  Arrival date & time 10/14/11  8295   First MD Initiated Contact with Patient 10/14/11 [phone]      Chief Complaint  Patient presents with  . Anxiety    (Consider location/radiation/quality/duration/timing/severity/associated sxs/prior treatment) HPI Pt reports 2 days of intermittent waxing and waning feelings of anxiety, nervousness. She recently had a total knee arthroplasty and has been doing well with rehab at home. She has no history of anxiety or panic attacks. Has been taking hydrocodone with good pain relief, has not run out, has not stopped suddenly. She denies any CP, SOB. She has been sweaty, nauseated and unable to rest. Not sleeping well. Having racing thoughts. No SI/HI, no hallucinations.   Past Medical History  Diagnosis Date  . Hyperlipemia   . Hypertension   . Hypothyroidism   . Cholelithiasis   . Hepatic steatosis   . PONV (postoperative nausea and vomiting)   . Arthritis     Past Surgical History  Procedure Date  . Abdominal hysterectomy     @ age 89, due to fibroids   . Tonsillectomy   . Mohs surgery 2004    Basal Cell   . Colonoscopy w/ polypectomy 2010     Dr.Jacobs, due 2015   . Dilation and curettage of uterus   . Total knee arthroplasty     Procedure: TOTAL KNEE ARTHROPLASTY;  Surgeon: Thera Flake., MD;  Location: [facility] OR;  Service: Orthopedics;  Laterality: Left;    Family History  Problem Relation Age of Onset  . Diabetes Mother   . Leukemia Mother   . Alcohol abuse Father   . Lung cancer Father   . Diabetes Brother   . Heart disease Maternal Uncle     X3 ; MIs in 30s & 40s    History  Substance Use Topics  . Smoking status: Never Smoker   . Smokeless tobacco: Not on file  . Alcohol Use: No    OB History    Grav Para Term Preterm Abortions TAB SAB Ect Mult Living                  Review of Systems All other systems reviewed and are negative except as noted in HPI.   Allergies  Ampicillin and  Doxycycline  Home Medications   Current Outpatient Rx  Name Route Sig Dispense Refill  . ENOXAPARIN SODIUM 30 MG/0.3ML [place] SOLN Subcutaneous Inject 0.3 mLs (30 mg total) into the skin every 12 (twelve) hours. 0.3 mL 0  . ENOXAPARIN SODIUM 30 MG/0.3ML [place] SOLN Subcutaneous Inject 0.3 mLs (30 mg total) into the skin every 12 (twelve) hours. 0.3 mL 0    Must use at least 2 weeks postop.  Marland Kitchen ESTRADIOL 1 MG PO TABS Oral Take 1 mg by mouth daily.    Marland Kitchen HYDROCHLOROTHIAZIDE 25 MG PO TABS Oral Take 12.5 mg by mouth daily.     Marland Kitchen LEVOTHYROXINE SODIUM 100 MCG PO TABS Oral Take 100 mcg by mouth daily.     Marland Kitchen LOSARTAN POTASSIUM 100 MG PO TABS Oral Take 100 mg by mouth daily.    Marland Kitchen ROSUVASTATIN CALCIUM 20 MG PO TABS Oral Take 20 mg by mouth daily.    . SERTRALINE HCL 50 MG PO TABS Oral Take 50 mg by mouth daily.       BP 135/78  Pulse 100  Temp 98.2 F (36.8 C) (Oral)  Resp 20  Ht 5\' 7"  (1.702 m)  Wt  245 lb (111.131 kg)  BMI 38.37 kg/m2  SpO2 98%  Physical Exam  Nursing note and vitals reviewed. Constitutional: She is oriented to person, place, and time. She appears well-developed and well-nourished.  HENT:  Head: Normocephalic and atraumatic.  Eyes: EOM are normal. Pupils are equal, round, and reactive to light.  Neck: Normal range of motion. Neck supple.  Cardiovascular: Normal rate, normal heart sounds and intact distal pulses.   Pulmonary/Chest: Effort normal and breath sounds normal.  Abdominal: Bowel sounds are normal. She exhibits no distension. There is no tenderness.  Musculoskeletal: Normal range of motion. She exhibits no edema and no tenderness.       Well healed incision to L knee  Neurological: She is alert and oriented to person, place, and time. She has normal strength. No cranial nerve deficit or sensory deficit.  Skin: Skin is warm. No rash noted.       diaphoretic  Psychiatric:       Nervous, tearful    ED Course  Procedures (including critical care time)  Labs Reviewed   CBC WITH DIFFERENTIAL - Abnormal; Notable for the following:    Platelets 510 (*)     All other components within normal limits  BASIC METABOLIC PANEL - Abnormal; Notable for the following:    Glucose, Bld 138 (*)     GFR calc non Af Amer 81 (*)     All other components within normal limits  URINALYSIS, ROUTINE W REFLEX MICROSCOPIC   No results found.   No diagnosis found.    MDM   Date: 10/14/2011  Rate: 83  Rhythm: normal sinus rhythm  QRS Axis: left  Intervals: normal  ST/T Wave abnormalities: normal  Conduction Disutrbances:right bundle branch block, incomplete  Narrative Interpretation:   Old EKG Reviewed: none available  Pt with symptoms of anxiety, no history of same. Will check labs. Give Ativan/Zofran. Pt has been taking synthroid as directed, no accidental overdose.    10:06 AM Pt initially able to sleep after Ativan but now sitting up, nervous, jittery, unable to sit still. Labs unremarkable. Vitals are normal. Pt may be having some degree of akathisia, advised to take Benadryl at home. Ativan PRN. PCP follow-up.     Aarsh Fristoe B. Bernette Mayers, MD 10/14/11 1008

## 2011-10-16 ENCOUNTER — Ambulatory Visit: Payer: BC Managed Care – PPO | Attending: Orthopedic Surgery | Admitting: Physical Therapy

## 2011-10-16 DIAGNOSIS — M25669 Stiffness of unspecified knee, not elsewhere classified: Secondary | ICD-10-CM | POA: Insufficient documentation

## 2011-10-16 DIAGNOSIS — M25569 Pain in unspecified knee: Secondary | ICD-10-CM | POA: Insufficient documentation

## 2011-10-16 DIAGNOSIS — R262 Difficulty in walking, not elsewhere classified: Secondary | ICD-10-CM | POA: Insufficient documentation

## 2011-10-16 DIAGNOSIS — IMO0001 Reserved for inherently not codable concepts without codable children: Secondary | ICD-10-CM | POA: Insufficient documentation

## 2011-10-17 ENCOUNTER — Ambulatory Visit (INDEPENDENT_AMBULATORY_CARE_PROVIDER_SITE_OTHER): Payer: BC Managed Care – PPO | Admitting: Internal Medicine

## 2011-10-17 ENCOUNTER — Encounter: Payer: Self-pay | Admitting: Internal Medicine

## 2011-10-17 VITALS — BP 118/80 | HR 122 | Temp 98.5°F | Wt 240.8 lb

## 2011-10-17 DIAGNOSIS — F419 Anxiety disorder, unspecified: Secondary | ICD-10-CM

## 2011-10-17 DIAGNOSIS — F411 Generalized anxiety disorder: Secondary | ICD-10-CM

## 2011-10-17 DIAGNOSIS — R000 Tachycardia, unspecified: Secondary | ICD-10-CM

## 2011-10-17 DIAGNOSIS — K921 Melena: Secondary | ICD-10-CM

## 2011-10-17 DIAGNOSIS — M25562 Pain in left knee: Secondary | ICD-10-CM

## 2011-10-17 DIAGNOSIS — M25569 Pain in unspecified knee: Secondary | ICD-10-CM

## 2011-10-17 DIAGNOSIS — R110 Nausea: Secondary | ICD-10-CM

## 2011-10-17 LAB — CBC WITH DIFFERENTIAL/PLATELET
Basophils Relative: 0.8 % (ref 0.0–3.0)
Eosinophils Absolute: 0.1 10*3/uL (ref 0.0–0.7)
Eosinophils Relative: 2.4 % (ref 0.0–5.0)
HCT: 40.9 % (ref 36.0–46.0)
Lymphs Abs: 1.7 10*3/uL (ref 0.7–4.0)
MCHC: 32.4 g/dL (ref 30.0–36.0)
MCV: 88.7 fl (ref 78.0–100.0)
Monocytes Absolute: 0.6 10*3/uL (ref 0.1–1.0)
Neutro Abs: 3.7 10*3/uL (ref 1.4–7.7)
RBC: 4.61 Mil/uL (ref 3.87–5.11)
WBC: 6.2 10*3/uL (ref 4.5–10.5)

## 2011-10-17 MED ORDER — CELECOXIB 200 MG PO CAPS: 200.0000 mg | ORAL_CAPSULE | Freq: Two times a day (BID) | ORAL | Status: DC

## 2011-10-17 MED ORDER — RANITIDINE HCL 150 MG PO TABS: 150.0000 mg | ORAL_TABLET | Freq: Two times a day (BID) | ORAL | Status: DC

## 2011-10-17 MED ORDER — SERTRALINE HCL 50 MG PO TABS: ORAL_TABLET | ORAL | Status: DC

## 2011-10-17 NOTE — Patient Instructions (Addendum)
The triggers for dyspepsia or "heart burn"  include stress; the "aspirin family" ; alcohol; peppermint; and caffeine (coffee, tea, cola, and chocolate). The aspirin family would include aspirin and the nonsteroidal agents such as ibuprofen &  Naproxen. Tylenol would not cause reflux. If having dyspepsia ; food & drink should be avoided for @ least 2 hours before going to bed. Please complete stool cards. 

## 2011-10-17 NOTE — Progress Notes (Signed)
  Subjective:    Patient ID: [name] [name], female    DOB: August 20, 1955, 56 y.o.   MRN: [id number]  HPI She describes marked anxiety since 10/13/11; this is associated with intermittent insomnia & nausea. She seen in the emergency room 9/21; extensive testing was negative. EKG revealed left axis deviation and incomplete right bundle branch block. Nonfasting glucose was 138; GFR 81; platelet count 510,000. Hematocrit was 36.1 She completed Lovenox 9/20;it was continued for 5 days following discharge from hospital.  She believes she may have had an episode of anxiety after her hysterectomy remotely; this was much less marked in intensity.  Her brother did have a history of severe depression. She previously been on sertraline 100 mg daily; this was decreased several years ago when life stressors decreased    Review of Systems She denies chest pain, palpitations, or shortness of breath. She's had no complications from the surgery. She denies a constellation of headache, flushing, chest pain and diarrhea.  She denies hoarseness or dysphagia with nausea. She has lost her taste. She is not having abdominal pain or rectal bleeding. She does describe dark, tarry stool; she is not on iron.  She ran out of her narcotic pain medication has been taking nonsteroidal anti-inflammatory agents.     Objective:   Physical Exam General appearance is one of good health and nourishment w/o distress.  Eyes: No conjunctival inflammation or scleral icterus is present.  Oral exam: Dental hygiene is good; lips and gums are healthy appearing.There is no oropharyngeal erythema or exudate noted. Osteoma of hard palate  Heart:  Fast  rate  ( pulse rate 102 on recheck) and regular rhythm. S1 and S2 normal without gallop, murmur, click, rub or other extra sounds     Lungs:Chest clear to auscultation; no wheezes, rhonchi,rales ,or rubs present.No increased work of breathing.   Abdomen: bowel sounds decreased, soft  and non-tender without masses, organomegaly or hernias noted.  No guarding or rebound   Skin:Warm & dry.  Intact without suspicious lesions or rashes ; no jaundice or tenting  Lymphatic: No lymphadenopathy is noted about the head, neck, axilla areas.   Neuro: Fine tremor of the hands  Musculoskeletal: Scar healing well; effusion present in the left knee  Psych: Alert and oriented; clinically she appears somewhat depressed.             Assessment & Plan:  #1 nausea  #2 questionable melena  #3 tachycardia  #4 anxiety  Plan: See orders and recommendations

## 2011-10-18 ENCOUNTER — Other Ambulatory Visit: Payer: Self-pay | Admitting: Internal Medicine

## 2011-10-18 ENCOUNTER — Ambulatory Visit: Payer: BC Managed Care – PPO | Admitting: Rehabilitation

## 2011-10-18 DIAGNOSIS — Z96659 Presence of unspecified artificial knee joint: Secondary | ICD-10-CM

## 2011-10-18 DIAGNOSIS — F411 Generalized anxiety disorder: Secondary | ICD-10-CM

## 2011-10-18 DIAGNOSIS — R7989 Other specified abnormal findings of blood chemistry: Secondary | ICD-10-CM

## 2011-10-18 DIAGNOSIS — R000 Tachycardia, unspecified: Secondary | ICD-10-CM

## 2011-10-18 DIAGNOSIS — F430 Acute stress reaction: Secondary | ICD-10-CM

## 2011-10-19 ENCOUNTER — Ambulatory Visit: Payer: BC Managed Care – PPO | Admitting: Rehabilitation

## 2011-10-19 ENCOUNTER — Ambulatory Visit (HOSPITAL_BASED_OUTPATIENT_CLINIC_OR_DEPARTMENT_OTHER)
Admission: RE | Admit: 2011-10-19 | Discharge: 2011-10-19 | Disposition: A | Discharge status: Home / Self Care | Payer: BC Managed Care – PPO | Source: Ambulatory Visit | Attending: Internal Medicine | Admitting: Internal Medicine

## 2011-10-19 DIAGNOSIS — R7989 Other specified abnormal findings of blood chemistry: Secondary | ICD-10-CM

## 2011-10-19 DIAGNOSIS — R791 Abnormal coagulation profile: Secondary | ICD-10-CM | POA: Insufficient documentation

## 2011-10-19 DIAGNOSIS — Z96659 Presence of unspecified artificial knee joint: Secondary | ICD-10-CM

## 2011-10-19 DIAGNOSIS — F411 Generalized anxiety disorder: Secondary | ICD-10-CM

## 2011-10-19 DIAGNOSIS — R000 Tachycardia, unspecified: Secondary | ICD-10-CM | POA: Insufficient documentation

## 2011-10-19 MED ORDER — IOHEXOL 350 MG/ML SOLN
80.0000 mL | Freq: Once | INTRAVENOUS | Status: AC | PRN
  Administered 2011-10-19: 80 mL via INTRAVENOUS

## 2011-10-23 ENCOUNTER — Ambulatory Visit: Payer: BC Managed Care – PPO | Admitting: Rehabilitation

## 2011-10-24 ENCOUNTER — Other Ambulatory Visit: Payer: Self-pay | Admitting: Internal Medicine

## 2011-10-24 ENCOUNTER — Ambulatory Visit: Payer: BC Managed Care – PPO | Attending: Orthopedic Surgery | Admitting: Rehabilitation

## 2011-10-24 DIAGNOSIS — M25669 Stiffness of unspecified knee, not elsewhere classified: Secondary | ICD-10-CM | POA: Insufficient documentation

## 2011-10-24 DIAGNOSIS — R262 Difficulty in walking, not elsewhere classified: Secondary | ICD-10-CM | POA: Insufficient documentation

## 2011-10-24 DIAGNOSIS — M25569 Pain in unspecified knee: Secondary | ICD-10-CM | POA: Insufficient documentation

## 2011-10-24 DIAGNOSIS — IMO0001 Reserved for inherently not codable concepts without codable children: Secondary | ICD-10-CM | POA: Insufficient documentation

## 2011-10-26 ENCOUNTER — Ambulatory Visit: Payer: BC Managed Care – PPO | Admitting: Physical Therapy

## 2011-10-27 ENCOUNTER — Other Ambulatory Visit (INDEPENDENT_AMBULATORY_CARE_PROVIDER_SITE_OTHER): Payer: BC Managed Care – PPO

## 2011-10-27 DIAGNOSIS — Z1289 Encounter for screening for malignant neoplasm of other sites: Secondary | ICD-10-CM

## 2011-10-27 LAB — POC HEMOCCULT BLD/STL (OFFICE/1-CARD/DIAGNOSTIC): Fecal Occult Blood, POC: NEGATIVE

## 2011-10-31 ENCOUNTER — Ambulatory Visit (INDEPENDENT_AMBULATORY_CARE_PROVIDER_SITE_OTHER): Payer: BC Managed Care – PPO | Admitting: Internal Medicine

## 2011-10-31 ENCOUNTER — Encounter: Payer: Self-pay | Admitting: Internal Medicine

## 2011-10-31 ENCOUNTER — Ambulatory Visit: Payer: BC Managed Care – PPO | Admitting: Rehabilitation

## 2011-10-31 VITALS — BP 118/84 | HR 84 | Temp 98.8°F | Wt 241.0 lb

## 2011-10-31 DIAGNOSIS — G479 Sleep disorder, unspecified: Secondary | ICD-10-CM

## 2011-10-31 DIAGNOSIS — R110 Nausea: Secondary | ICD-10-CM

## 2011-10-31 DIAGNOSIS — R5381 Other malaise: Secondary | ICD-10-CM

## 2011-10-31 DIAGNOSIS — G259 Extrapyramidal and movement disorder, unspecified: Secondary | ICD-10-CM

## 2011-10-31 DIAGNOSIS — R259 Unspecified abnormal involuntary movements: Secondary | ICD-10-CM

## 2011-10-31 DIAGNOSIS — R000 Tachycardia, unspecified: Secondary | ICD-10-CM

## 2011-10-31 DIAGNOSIS — R5383 Other fatigue: Secondary | ICD-10-CM

## 2011-10-31 MED ORDER — CLONAZEPAM 0.5 MG PO TABS: ORAL_TABLET | ORAL | Status: DC

## 2011-10-31 MED ORDER — ESOMEPRAZOLE MAGNESIUM 40 MG PO CPDR: 40.0000 mg | DELAYED_RELEASE_CAPSULE | Freq: Every day | ORAL | Status: DC

## 2011-10-31 NOTE — Patient Instructions (Addendum)
To prevent sleep dysfunction follow these instructions for sleep hygiene. Do not read, watch TV, or eat in bed. Do not get into bed until you are ready to turn off the light &  to go to sleep. Do not ingest stimulants ( decongestants, diet pills, nicotine, caffeine) after the evening meal. Please perform isometric exercises before going to bed; Magcal may help the muscle symptoms as we discussed.

## 2011-10-31 NOTE — Progress Notes (Signed)
  Subjective:    Patient ID: [name] [name], female    DOB: 1955-02-17, 56 y.o.   MRN: [id number]  HPI Her anxiety did resolve until last week in the context of stopping narcotics & recent severe insomnia. This is manifested as difficulty going to sleep and staying asleep. She denies any associated nightmares but does have inability to keep her legs still. Her husband states there is no snoring or apnea. She occasionally watches TV but does not typically read, watch TV, or if bedtime. She is not taking daytime naps. Over the weekend she took lorazepam 1 mg for 2 nights with significant improvement in her sleep pattern. She stated that she slept until 3 PM 10/29/11.      Review of Systems  She continues to have nausea despite Ranitidine twice a day. She also has intermittent tachycardia. This will occur one-2 times per day lasting 1-2 hours. It does improve if she rests. Symptoms are impacting her job performance. She remains extremely  fatigued. Extensive labs performed 9/21 and 9/24 were reviewed. GFR was minimally reduced at 81; nonfasting glucose was 138. Platelet count was mildly increased at 514,000. The d-dimer at that time was 1.93 prompting the CT angiogram 9/26 which revealed no pulmonary emboli.  She denies a constellation of headache, flushing, chest pain, & diarrhea     Objective:   Physical Exam General appearance is one of good health and nourishment w/o distress.  Eyes: No conjunctival inflammation or scleral icterus is present. EOMI; no lid lag  Neck: Thyroid normal to palpation without enlargement or nodules  Oral exam: Dental hygiene is good; lips and gums are healthy appearing.There is no oropharyngeal erythema or exudate noted.   Heart:  Normal rate and regular rhythm. S1 and S2 normal without gallop, murmur, click, rub or other extra sounds    Lungs:Chest clear to auscultation; no wheezes, rhonchi,rales ,or rubs present.No increased work of breathing.   Abdomen:  bowel sounds normal, soft and non-tender without masses, organomegaly or hernias noted.  No guarding or rebound   Skin:Warm & dry.  Intact without suspicious lesions or rashes ; no jaundice or tenting  Lymphatic: No lymphadenopathy is noted about the head, neck, axilla areas.   Nails appear healthy without onycholysis  Neuro: Deep tendon reflexes are brisk and equal. Left knee reflex was not checked. Fine tremor noted in hands. Intermittent nystagmus-like activity of the ankles and feet             Assessment & Plan:  #1 intermittent tachycardia in the context of anxiety. EKG normal  #2 sleep disorder  #3 involuntary leg movement  #4 nausea  #5 fatigue, most likely from #2 and 3  Plan: Labs were reviewed with her. I see no indication to repeat additional studies at this time. Focus will be on controlling #2 and #3. Some of the symptoms may actually represent withdrawal some narcotic withdrawal symptoms. If tachycardia persists; Holter monitor will be pursued

## 2011-11-01 ENCOUNTER — Ambulatory Visit: Payer: BC Managed Care – PPO | Admitting: Physical Therapy

## 2011-11-02 ENCOUNTER — Ambulatory Visit: Payer: BC Managed Care – PPO | Admitting: Physical Therapy

## 2011-11-06 ENCOUNTER — Ambulatory Visit: Payer: BC Managed Care – PPO | Admitting: Rehabilitation

## 2011-11-07 ENCOUNTER — Ambulatory Visit: Payer: BC Managed Care – PPO | Admitting: Rehabilitation

## 2011-11-08 ENCOUNTER — Ambulatory Visit: Payer: BC Managed Care – PPO | Admitting: Rehabilitation

## 2011-11-13 ENCOUNTER — Ambulatory Visit: Payer: BC Managed Care – PPO | Admitting: Physical Therapy

## 2011-11-14 ENCOUNTER — Ambulatory Visit: Payer: BC Managed Care – PPO | Admitting: Physical Therapy

## 2011-11-15 ENCOUNTER — Ambulatory Visit: Payer: BC Managed Care – PPO | Admitting: Physical Therapy

## 2011-11-21 ENCOUNTER — Ambulatory Visit: Payer: BC Managed Care – PPO | Admitting: Rehabilitation

## 2011-11-22 ENCOUNTER — Ambulatory Visit: Payer: BC Managed Care – PPO | Admitting: Rehabilitation

## 2011-11-27 ENCOUNTER — Ambulatory Visit: Payer: BC Managed Care – PPO | Attending: Orthopedic Surgery | Admitting: Rehabilitation

## 2011-11-27 DIAGNOSIS — R262 Difficulty in walking, not elsewhere classified: Secondary | ICD-10-CM | POA: Insufficient documentation

## 2011-11-27 DIAGNOSIS — M25669 Stiffness of unspecified knee, not elsewhere classified: Secondary | ICD-10-CM | POA: Insufficient documentation

## 2011-11-27 DIAGNOSIS — IMO0001 Reserved for inherently not codable concepts without codable children: Secondary | ICD-10-CM | POA: Insufficient documentation

## 2011-11-27 DIAGNOSIS — M25569 Pain in unspecified knee: Secondary | ICD-10-CM | POA: Insufficient documentation

## 2011-11-28 ENCOUNTER — Ambulatory Visit: Payer: BC Managed Care – PPO | Admitting: Rehabilitation

## 2011-11-30 ENCOUNTER — Encounter: Payer: BC Managed Care – PPO | Admitting: Rehabilitation

## 2011-12-04 ENCOUNTER — Ambulatory Visit: Payer: BC Managed Care – PPO | Admitting: Rehabilitation

## 2011-12-06 ENCOUNTER — Ambulatory Visit: Payer: BC Managed Care – PPO | Admitting: Physical Therapy

## 2011-12-07 ENCOUNTER — Encounter: Payer: BC Managed Care – PPO | Admitting: Physical Therapy

## 2011-12-12 ENCOUNTER — Ambulatory Visit: Payer: BC Managed Care – PPO | Admitting: Rehabilitation

## 2011-12-13 ENCOUNTER — Encounter: Payer: BC Managed Care – PPO | Admitting: Physical Therapy

## 2011-12-14 ENCOUNTER — Ambulatory Visit: Payer: BC Managed Care – PPO | Admitting: Rehabilitation

## 2011-12-18 ENCOUNTER — Ambulatory Visit: Payer: BC Managed Care – PPO | Admitting: Rehabilitation

## 2011-12-19 ENCOUNTER — Ambulatory Visit: Payer: BC Managed Care – PPO | Admitting: Physical Therapy

## 2011-12-20 ENCOUNTER — Encounter: Payer: BC Managed Care – PPO | Admitting: Rehabilitation

## 2011-12-26 ENCOUNTER — Ambulatory Visit: Payer: BC Managed Care – PPO | Attending: Orthopedic Surgery | Admitting: Rehabilitation

## 2011-12-26 DIAGNOSIS — R262 Difficulty in walking, not elsewhere classified: Secondary | ICD-10-CM | POA: Insufficient documentation

## 2011-12-26 DIAGNOSIS — M25569 Pain in unspecified knee: Secondary | ICD-10-CM | POA: Insufficient documentation

## 2011-12-26 DIAGNOSIS — M25669 Stiffness of unspecified knee, not elsewhere classified: Secondary | ICD-10-CM | POA: Insufficient documentation

## 2011-12-26 DIAGNOSIS — IMO0001 Reserved for inherently not codable concepts without codable children: Secondary | ICD-10-CM | POA: Insufficient documentation

## 2011-12-28 ENCOUNTER — Ambulatory Visit: Payer: BC Managed Care – PPO | Admitting: Rehabilitation

## 2012-01-02 ENCOUNTER — Ambulatory Visit: Payer: BC Managed Care – PPO | Admitting: Rehabilitation

## 2012-01-04 ENCOUNTER — Ambulatory Visit: Payer: BC Managed Care – PPO | Admitting: Physical Therapy

## 2012-01-09 ENCOUNTER — Ambulatory Visit: Payer: BC Managed Care – PPO | Admitting: Physical Therapy

## 2012-01-11 ENCOUNTER — Ambulatory Visit: Payer: BC Managed Care – PPO | Admitting: Physical Therapy

## 2012-01-15 ENCOUNTER — Ambulatory Visit: Payer: BC Managed Care – PPO | Admitting: Physical Therapy

## 2012-01-18 ENCOUNTER — Ambulatory Visit: Payer: BC Managed Care – PPO | Admitting: Physical Therapy

## 2012-02-28 ENCOUNTER — Other Ambulatory Visit: Payer: Self-pay | Admitting: Internal Medicine

## 2012-03-09 ENCOUNTER — Other Ambulatory Visit: Payer: Self-pay

## 2012-03-20 ENCOUNTER — Other Ambulatory Visit: Payer: Self-pay | Admitting: Obstetrics and Gynecology

## 2012-04-07 ENCOUNTER — Other Ambulatory Visit: Payer: Self-pay | Admitting: Internal Medicine

## 2012-05-13 ENCOUNTER — Telehealth: Payer: Self-pay | Admitting: Internal Medicine

## 2012-05-13 DIAGNOSIS — E039 Hypothyroidism, unspecified: Secondary | ICD-10-CM

## 2012-05-13 DIAGNOSIS — E785 Hyperlipidemia, unspecified: Secondary | ICD-10-CM

## 2012-05-13 DIAGNOSIS — T887XXA Unspecified adverse effect of drug or medicament, initial encounter: Secondary | ICD-10-CM

## 2012-05-13 DIAGNOSIS — I10 Essential (primary) hypertension: Secondary | ICD-10-CM

## 2012-05-13 DIAGNOSIS — Z0000 Encounter for general adult medical examination without abnormal findings: Secondary | ICD-10-CM

## 2012-05-13 NOTE — Telephone Encounter (Signed)
Patient would like to have labs done prior to her 07/02/12 CPE. She is double checking with her insurance about coverage. Can you place orders for [facility] please?

## 2012-05-13 NOTE — Telephone Encounter (Signed)
Orders placed.

## 2012-06-25 ENCOUNTER — Other Ambulatory Visit (INDEPENDENT_AMBULATORY_CARE_PROVIDER_SITE_OTHER): Payer: BC Managed Care – PPO

## 2012-06-25 DIAGNOSIS — E039 Hypothyroidism, unspecified: Secondary | ICD-10-CM

## 2012-06-25 DIAGNOSIS — E785 Hyperlipidemia, unspecified: Secondary | ICD-10-CM

## 2012-06-25 DIAGNOSIS — T887XXA Unspecified adverse effect of drug or medicament, initial encounter: Secondary | ICD-10-CM

## 2012-06-25 DIAGNOSIS — Z0000 Encounter for general adult medical examination without abnormal findings: Secondary | ICD-10-CM

## 2012-06-25 DIAGNOSIS — I10 Essential (primary) hypertension: Secondary | ICD-10-CM

## 2012-06-25 LAB — BASIC METABOLIC PANEL
BUN: 22 mg/dL (ref 6–23)
Calcium: 8.7 mg/dL (ref 8.4–10.5)
Creatinine, Ser: 0.8 mg/dL (ref 0.4–1.2)
GFR: 84.61 mL/min (ref >60.00)
Potassium: 3.7 mEq/L (ref 3.5–5.1)

## 2012-06-25 LAB — CBC WITH DIFFERENTIAL/PLATELET
Basophils Absolute: 0 10*3/uL (ref 0.0–0.1)
Basophils Relative: 0.5 % (ref 0.0–3.0)
Eosinophils Absolute: 0.3 10*3/uL (ref 0.0–0.7)
Eosinophils Relative: 3.7 % (ref 0.0–5.0)
HCT: 45.2 % (ref 36.0–46.0)
Hemoglobin: 15.2 g/dL — ABNORMAL HIGH (ref 12.0–15.0)
Lymphocytes Relative: 23.1 % (ref 12.0–46.0)
Lymphs Abs: 1.6 10*3/uL (ref 0.7–4.0)
MCHC: 33.7 g/dL (ref 30.0–36.0)
MCV: 86.1 fl (ref 78.0–100.0)
Monocytes Absolute: 0.4 10*3/uL (ref 0.1–1.0)
Monocytes Relative: 6.5 % (ref 3.0–12.0)
Neutro Abs: 4.5 10*3/uL (ref 1.4–7.7)
Neutrophils Relative %: 66.2 % (ref 43.0–77.0)
Platelets: 306 10*3/uL (ref 150.0–400.0)
RBC: 5.25 Mil/uL — ABNORMAL HIGH (ref 3.87–5.11)
RDW: 14.1 % (ref 11.5–14.6)
WBC: 6.9 10*3/uL (ref 4.5–10.5)

## 2012-06-25 LAB — HEPATIC FUNCTION PANEL
ALT: 24 U/L (ref 0–35)
Alkaline Phosphatase: 54 U/L (ref 39–117)
Bilirubin, Direct: 0 mg/dL (ref 0.0–0.3)
Total Protein: 7 g/dL (ref 6.0–8.3)

## 2012-06-25 LAB — LIPID PANEL
Cholesterol: 139 mg/dL (ref 0–200)
VLDL: 23 mg/dL (ref 0.0–40.0)

## 2012-06-25 LAB — TSH: TSH: 1.89 u[IU]/mL (ref 0.35–5.50)

## 2012-07-02 ENCOUNTER — Encounter: Payer: Self-pay | Admitting: Internal Medicine

## 2012-07-02 ENCOUNTER — Ambulatory Visit (INDEPENDENT_AMBULATORY_CARE_PROVIDER_SITE_OTHER): Payer: BC Managed Care – PPO | Admitting: Internal Medicine

## 2012-07-02 VITALS — BP 128/86 | HR 75 | Temp 98.2°F | Resp 12 | Ht 67.0 in | Wt 227.0 lb

## 2012-07-02 DIAGNOSIS — H1010 Acute atopic conjunctivitis, unspecified eye: Secondary | ICD-10-CM | POA: Insufficient documentation

## 2012-07-02 DIAGNOSIS — Z0000 Encounter for general adult medical examination without abnormal findings: Secondary | ICD-10-CM

## 2012-07-02 DIAGNOSIS — J302 Other seasonal allergic rhinitis: Secondary | ICD-10-CM

## 2012-07-02 DIAGNOSIS — D751 Secondary polycythemia: Secondary | ICD-10-CM

## 2012-07-02 DIAGNOSIS — Z9189 Other specified personal risk factors, not elsewhere classified: Secondary | ICD-10-CM

## 2012-07-02 DIAGNOSIS — H1013 Acute atopic conjunctivitis, bilateral: Secondary | ICD-10-CM

## 2012-07-02 HISTORY — DX: Acute atopic conjunctivitis, unspecified eye: J30.2

## 2012-07-02 HISTORY — DX: Acute atopic conjunctivitis, unspecified eye: H10.10

## 2012-07-02 NOTE — Progress Notes (Signed)
  Subjective:    Patient ID: [name] [name], female    DOB: Oct 31, 1955, 57 y.o.   MRN: [id number]  HPI  She is here for a physical;acute issues include memory issues.       Review of Systems  Her memory issues involve forgetting the name of people she knows well & difficulty completing sentences. She never gets lost. There is no family history of dementia but her mother & sister have had memory issues.     Objective:   Physical Exam  Gen.: Well-nourished in appearance. Alert, appropriate and cooperative throughout exam. Head: Normocephalic without obvious abnormalities Eyes: No corneal or conjunctival inflammation noted. Pupils equal round reactive to light and accommodation. Fundal exam is benign without hemorrhages, exudate, papilledema. Extraocular motion intact. Vision grossly normal with lenses Ears: External  ear exam reveals no significant lesions or deformities. Canals clear .TMs normal. Hearing is grossly normal bilaterally. Nose: External nasal exam reveals no deformity or inflammation. Nasal mucosa are pink and moist. No lesions or exudates noted. Mouth: Oral mucosa and oropharynx reveal no lesions or exudates. Teeth in good repair. Neck: No deformities, masses, or tenderness noted. Range of motion & Thyroid normal. Lungs: Normal respiratory effort; chest expands symmetrically. Lungs are clear to auscultation without rales, wheezes, or increased work of breathing. Heart: Normal rate and rhythm. Normal S1 and S2. No gallop, click, or rub.No murmur. Abdomen: Bowel sounds normal; abdomen soft and nontender. No masses, organomegaly or hernias noted. Genitalia: As per Gyn                                  Musculoskeletal/extremities: Minimally accentuated curvature of upper thoracic spine. No clubbing, cyanosis, edema, or significant extremity  deformity noted. Range of motion decreased L knee .Crepitus of knees, L > R.Tone & strength  Normal. Joints normal. Nail health  good. Able to lie down & sit up w/o help. Negative SLR bilaterally Vascular: Carotid, radial artery, dorsalis pedis and  posterior tibial pulses are full and equal. No bruits present. Neurologic: Alert and oriented x3. Deep tendon reflexes symmetrical and normal.  MMSE: 29 out of 30(missed 1 recall item).She named 10 animals in 15 seconds. Clock drawing good Skin: Intact without suspicious lesions or rashes. Lymph: No cervical, axillary lymphadenopathy present. Psych: Mood and affect are normal. Normally interactive                                                                                        Assessment & Plan:  #1 comprehensive physical exam; no acute findings #2 subjective memory deficit not documented  #3 snoring with mild elevation of the hemoglobin and red cell count; rule out sleep apnea  Plan: see Orders  & Recommendations

## 2012-07-02 NOTE — Patient Instructions (Addendum)
Preventive Health Care: Eat a low-fat diet with lots of fruits and vegetables, up to 7-9 servings per day. This would eliminate need for vitamin supplements for most individuals. Minimal Blood Pressure Goal= AVERAGE < 140/90;  Ideal is an AVERAGE < 135/85. This AVERAGE should be calculated from @ least 5-7 BP readings taken @ different times of day on different days of week. You should not respond to isolated BP readings , but rather the AVERAGE for that week .Please bring your  blood pressure cuff to office visits to verify that it is reliable.It  can also be checked against the blood pressure device at the pharmacy. Finger or wrist cuffs are not dependable; an arm cuff is. Plain Mucinex (NOT D) for thick secretions ;force NON dairy fluids .   Nasal cleansing in the shower as discussed with lather of mild shampoo.After 10 seconds wash off lather while  exhaling through nostrils. Make sure that all residual soap is removed to prevent irritation.  Fluticasone 1 spray in each nostril twice a day as needed. Use the "crossover" technique into opposite nostril spraying toward opposite ear @ 45 degree angle, not straight up into nostril.  Use a Neti pot daily only  as needed for significant sinus congestion; going from open side to congested side . Plain Allegra (NOT D )  160 daily , Loratidine 10 mg , OR Zyrtec 10 mg @ bedtime  as needed for itchy eyes & sneezing.

## 2012-07-30 ENCOUNTER — Institutional Professional Consult (permissible substitution): Payer: BC Managed Care – PPO | Admitting: Pulmonary Disease

## 2012-07-30 ENCOUNTER — Other Ambulatory Visit: Payer: Self-pay | Admitting: Internal Medicine

## 2012-09-06 ENCOUNTER — Ambulatory Visit (INDEPENDENT_AMBULATORY_CARE_PROVIDER_SITE_OTHER): Payer: BC Managed Care – PPO | Admitting: Pulmonary Disease

## 2012-09-06 ENCOUNTER — Encounter: Payer: Self-pay | Admitting: Pulmonary Disease

## 2012-09-06 VITALS — BP 132/88 | HR 74 | Temp 98.5°F | Ht 65.5 in | Wt 232.0 lb

## 2012-09-06 DIAGNOSIS — G4733 Obstructive sleep apnea (adult) (pediatric): Secondary | ICD-10-CM

## 2012-09-06 HISTORY — DX: Obstructive sleep apnea (adult) (pediatric): G47.33

## 2012-09-06 NOTE — Patient Instructions (Addendum)
Will schedule for home sleep testing.  Will call to discuss once results are available. Please ask your husband if you kick a lot during the night. Work on weight loss

## 2012-09-06 NOTE — Progress Notes (Signed)
Subjective:    Patient ID: [name] [name], female    DOB: 1955/07/21, 57 y.o.   MRN: [id number]  HPI The patient is a 57 year old female who I've been asked to see for possible obstructive sleep apnea.  The patient has been noticing nonrestorative sleep, as well as a change in her memory.  She is been noted have loud snoring by her bed partner, and describes significant snoring arousals.  She has not had any choking arousals.  She awakens at least 2 times a night to the bathroom, and feels unrested in the mornings at least 50% of the time.  She does describe some restless leg syndrome symptoms, but is unsure if she kicks during the night.  She does have a history of "rocking" while awake and asleep since childhood.  She has definite sleep pressure are in the day with inactivity, and has actually fallen asleep at her desk.  She only has occasional issues in the evenings watching television or movies, and this is primarily on weekends.  She is unable to drive longer distances without falling asleep.  Patient states her weight is down 30 pounds over the last 2 years, and her upper score today is 10.   Sleep Questionnaire What time do you typically go to bed?( Between what hours) 11pm 11pm at 1448 on 09/06/12 by Maisie Fus, CMA How long does it take you to fall asleep?  at 1448 on 09/06/12 by Maisie Fus, CMA How many times during the night do you wake up? 2 2 at 1448 on 09/06/12 by Maisie Fus, CMA What time do you get out of bed to start your day? 0600 0600 at 1448 on 09/06/12 by Maisie Fus, CMA Do you drive or operate heavy machinery in your occupation? No No at 1448 on 09/06/12 by Maisie Fus, CMA How much has your weight changed (up or down) over the past two years? (In pounds) 30 lb (13.608 kg)30 lb (13.608 kg) decrease at 1448 on 09/06/12 by Maisie Fus, CMA Have you ever had a sleep study before? No No at 1448 on 09/06/12 by Maisie Fus, CMA  Do you currently use CPAP? No No at 1448 on 09/06/12 by Maisie Fus, CMA Do you wear oxygen at any time? No No at 1448 on 09/06/12 by Maisie Fus, CMA   Review of Systems  Constitutional: Negative for fever and unexpected weight change.  HENT: Positive for rhinorrhea. Negative for ear pain, nosebleeds, congestion, sore throat, sneezing, trouble swallowing, dental problem, postnasal drip and sinus pressure.   Eyes: Negative for redness and itching.  Respiratory: Negative for cough, chest tightness, shortness of breath and wheezing.   Cardiovascular: Negative for palpitations and leg swelling.  Gastrointestinal: Negative for nausea and vomiting.  Genitourinary: Negative for dysuria.  Musculoskeletal: Negative for joint swelling.  Skin: Negative for rash.  Neurological: Negative for headaches.  Hematological: Does not bruise/bleed easily.  Psychiatric/Behavioral: Negative for dysphoric mood. The patient is not nervous/anxious.        Objective:   Physical Exam Constitutional:  Obese female, no acute distress  HENT:  Nares patent without discharge  Oropharynx without exudate, palate and uvula are thick and elongated.   Eyes:  Perrla, eomi, no scleral icterus  Neck:  No JVD, no TMG  Cardiovascular:  Normal rate, regular rhythm, no rubs or gallops.  No murmurs        Intact distal pulses  Pulmonary :  Normal breath  sounds, no stridor or respiratory distress   No rales, rhonchi, or wheezing  Abdominal:  Soft, nondistended, bowel sounds present.  No tenderness noted.   Musculoskeletal:  No lower extremity edema noted.  Lymph Nodes:  No cervical lymphadenopathy noted  Skin:  No cyanosis noted  Neurologic:  Alert, appropriate, moves all 4 extremities without obvious deficit.         Assessment & Plan:

## 2012-09-06 NOTE — Assessment & Plan Note (Signed)
The patient's history is clearly suggestive of possible sleep-disordered breathing.  She has loud snoring with arousals, nonrestorative sleep, definite sleep pressure during the day with an activity, and is obese with abnormal upper airway anatomy.  I think she needs to have a sleep study to try and establish a diagnosis, and is a good candidate for whom sleep testing.  If this is unremarkable, I would question whether she may have a primary movement disorder of sleep that is causing some of her symptoms.

## 2012-09-14 DIAGNOSIS — G479 Sleep disorder, unspecified: Secondary | ICD-10-CM

## 2012-09-14 DIAGNOSIS — R0989 Other specified symptoms and signs involving the circulatory and respiratory systems: Secondary | ICD-10-CM

## 2012-09-14 DIAGNOSIS — R0609 Other forms of dyspnea: Secondary | ICD-10-CM

## 2012-09-14 DIAGNOSIS — G4710 Hypersomnia, unspecified: Secondary | ICD-10-CM

## 2012-09-17 ENCOUNTER — Encounter: Payer: Self-pay | Admitting: Pulmonary Disease

## 2012-09-17 ENCOUNTER — Telehealth: Payer: Self-pay | Admitting: Pulmonary Disease

## 2012-09-17 DIAGNOSIS — I498 Other specified cardiac arrhythmias: Secondary | ICD-10-CM

## 2012-09-17 DIAGNOSIS — G4733 Obstructive sleep apnea (adult) (pediatric): Secondary | ICD-10-CM

## 2012-09-17 NOTE — Telephone Encounter (Signed)
LMOM x 1 

## 2012-09-17 NOTE — Telephone Encounter (Signed)
Pt needs ov to discuss results of sleep study.

## 2012-09-25 NOTE — Telephone Encounter (Signed)
Returning call.  [phone]

## 2012-09-25 NOTE — Telephone Encounter (Signed)
LMOTCB x1 for pt

## 2012-09-27 NOTE — Telephone Encounter (Signed)
I spoke with pt. appt scheduled

## 2012-10-01 ENCOUNTER — Ambulatory Visit (INDEPENDENT_AMBULATORY_CARE_PROVIDER_SITE_OTHER): Payer: BC Managed Care – PPO | Admitting: Pulmonary Disease

## 2012-10-01 ENCOUNTER — Encounter: Payer: Self-pay | Admitting: Pulmonary Disease

## 2012-10-01 VITALS — BP 128/78 | HR 71 | Temp 97.9°F | Ht 66.0 in | Wt 233.4 lb

## 2012-10-01 DIAGNOSIS — G4733 Obstructive sleep apnea (adult) (pediatric): Secondary | ICD-10-CM

## 2012-10-01 NOTE — Progress Notes (Signed)
  Subjective:    Patient ID: [name] [name], female    DOB: [birth year], 57 y.o.   MRN: [id number]  HPI The patient comes in today for followup of her recent sleep test.  She was found to have mild obstructive sleep apnea, with an AHI of 8 events per hour and mild oxygen desaturation.  I have reviewed this study with her in detail, and answered all of her questions.   Review of Systems  Constitutional: Negative for fever and unexpected weight change.  HENT: Positive for postnasal drip and sinus pressure. Negative for ear pain, nosebleeds, congestion, sore throat, rhinorrhea, sneezing, trouble swallowing and dental problem.   Eyes: Negative for redness and itching.  Respiratory: Negative for cough, chest tightness, shortness of breath and wheezing.   Cardiovascular: Negative for palpitations and leg swelling.  Gastrointestinal: Negative for nausea and vomiting.  Genitourinary: Negative for dysuria.  Musculoskeletal: Negative for joint swelling.  Skin: Negative for rash.  Neurological: Positive for headaches.  Hematological: Does not bruise/bleed easily.  Psychiatric/Behavioral: Negative for dysphoric mood. The patient is not nervous/anxious.        Objective:   Physical Exam Obese female in nad Nose without purulence or discharge noted. Neck without LN or TMG. LE with minimal edema, no cyanosis Alert and oriented, moves all 4.        Assessment & Plan:

## 2012-10-01 NOTE — Assessment & Plan Note (Signed)
The pt has been diagnosed with mild osa by her recent HST.  I have reviewed the various treatment options with her, including a trial of weight loss alone, upper airway surgery, dental appliance, and cpap.  At this point, she would like to work on weight loss, but will also give serious consideration to a dental appliance.

## 2012-10-01 NOTE — Patient Instructions (Addendum)
Think about your treatment options:  Trial of weight loss alone, dental appliance, and cpap.  Give yourself a period of time to lose moderate weight, and if you are not reaching that goal, would consider more aggressive treatment.

## 2012-11-28 ENCOUNTER — Other Ambulatory Visit: Payer: Self-pay

## 2013-01-03 ENCOUNTER — Encounter: Payer: Self-pay | Admitting: Family Medicine

## 2013-01-03 ENCOUNTER — Ambulatory Visit (INDEPENDENT_AMBULATORY_CARE_PROVIDER_SITE_OTHER): Payer: BC Managed Care – PPO | Admitting: Family Medicine

## 2013-01-03 ENCOUNTER — Other Ambulatory Visit: Payer: Self-pay | Admitting: General Practice

## 2013-01-03 VITALS — BP 128/70 | HR 92 | Temp 98.8°F | Resp 16 | Wt 234.0 lb

## 2013-01-03 DIAGNOSIS — J320 Chronic maxillary sinusitis: Secondary | ICD-10-CM | POA: Insufficient documentation

## 2013-01-03 DIAGNOSIS — J029 Acute pharyngitis, unspecified: Secondary | ICD-10-CM

## 2013-01-03 LAB — POCT RAPID STREP A (OFFICE): Rapid Strep A Screen: NEGATIVE

## 2013-01-03 MED ORDER — PROMETHAZINE-DM 6.25-15 MG/5ML PO SYRP: 5.0000 mL | ORAL_SOLUTION | Freq: Four times a day (QID) | ORAL | Status: DC | PRN

## 2013-01-03 MED ORDER — CLARITHROMYCIN ER 500 MG PO TB24: 1000.0000 mg | ORAL_TABLET | Freq: Every day | ORAL | Status: DC

## 2013-01-03 NOTE — Assessment & Plan Note (Signed)
Pt's sxs and PE consistent w/ infxn.  Start abx.  Cough meds prn.  Reviewed supportive care and red flags that should prompt return.  Pt expressed understanding and is in agreement w/ plan.  

## 2013-01-03 NOTE — Patient Instructions (Signed)
Follow up as needed Start the Biaxin- 2 tabs at the same time daily Drink plenty of fluids Cough syrup as needed- will cause drowsiness REST! Call with any questions or concerns Hang in there! Happy Holidays!!

## 2013-01-03 NOTE — Progress Notes (Signed)
Pre visit review using our clinic review tool, if applicable. No additional management support is needed unless otherwise documented below in the visit note. 

## 2013-01-03 NOTE — Progress Notes (Signed)
   Subjective:    Patient ID: [name] [name], female    DOB: [birth year], 57 y.o.   MRN: [id number]  HPI URI- sxs started 5 days ago.  Husband had similar sxs last week.  + sore throat- 'that's the worst part'.  Near total laryngitis.  + maxillary pain.  No tooth pain.  No fever.  Minimal cough but barking when it occurs.  No N/V/D.   Review of Systems For ROS see HPI     Objective:   Physical Exam  Vitals reviewed. Constitutional: She appears well-developed and well-nourished. No distress.  HENT:  Head: Normocephalic and atraumatic.  Right Ear: Tympanic membrane normal.  Left Ear: Tympanic membrane normal.  Nose: Mucosal edema and rhinorrhea present. Right sinus exhibits maxillary sinus tenderness. Right sinus exhibits no frontal sinus tenderness. Left sinus exhibits maxillary sinus tenderness. Left sinus exhibits no frontal sinus tenderness.  Mouth/Throat: Uvula is midline and mucous membranes are normal. Posterior oropharyngeal erythema present. No oropharyngeal exudate.  Eyes: Conjunctivae and EOM are normal. Pupils are equal, round, and reactive to light.  Neck: Normal range of motion. Neck supple.  Cardiovascular: Normal rate, regular rhythm and normal heart sounds.   Pulmonary/Chest: Effort normal and breath sounds normal. No respiratory distress. She has no wheezes.  Barking cough  Lymphadenopathy:    She has no cervical adenopathy.          Assessment & Plan:

## 2013-01-06 ENCOUNTER — Other Ambulatory Visit: Payer: Self-pay | Admitting: Family Medicine

## 2013-01-06 ENCOUNTER — Telehealth: Payer: Self-pay | Admitting: Family Medicine

## 2013-01-06 NOTE — Telephone Encounter (Signed)
Tessalon 200mg  TID, #60, no refills Cheratussin 5-10ml q6 prn, #240, no refills

## 2013-01-06 NOTE — Telephone Encounter (Signed)
Med called in, and pt notified.

## 2013-01-06 NOTE — Telephone Encounter (Signed)
Patient's husband states that the promethazine-dextromethorphan rx that she was given is not helping her symptoms and wants to know what else she would advise.

## 2013-01-07 NOTE — Telephone Encounter (Signed)
Pt states she is not having a cough anymore and  Does not want meds. States it is her ear that is hurting and voice is still raspy. Pt advised that this will get better with antibiotic and time. Meds cancelled.

## 2013-01-13 ENCOUNTER — Ambulatory Visit: Payer: BC Managed Care – PPO | Admitting: Internal Medicine

## 2013-01-13 ENCOUNTER — Ambulatory Visit (INDEPENDENT_AMBULATORY_CARE_PROVIDER_SITE_OTHER): Payer: BC Managed Care – PPO | Admitting: Internal Medicine

## 2013-01-13 VITALS — BP 139/82 | HR 94 | Temp 99.2°F | Wt 235.0 lb

## 2013-01-13 DIAGNOSIS — J209 Acute bronchitis, unspecified: Secondary | ICD-10-CM

## 2013-01-13 DIAGNOSIS — J329 Chronic sinusitis, unspecified: Secondary | ICD-10-CM

## 2013-01-13 MED ORDER — LEVOFLOXACIN 500 MG PO TABS: 500.0000 mg | ORAL_TABLET | Freq: Every day | ORAL | Status: DC

## 2013-01-13 MED ORDER — PREDNISONE 20 MG PO TABS: 20.0000 mg | ORAL_TABLET | Freq: Two times a day (BID) | ORAL | Status: DC

## 2013-01-13 NOTE — Progress Notes (Signed)
   Subjective:    Patient ID: [name] [name], female    DOB: 08/07/1955, 57 y.o.   MRN: [id number]  HPI   Her symptoms actually began 12/29/12; she was seen on 12/12 with sore throat, nonproductive cough, laryngitis. She has completed the full course of Biaxin but continues to have hoarseness and green nasal and green sputum production. She also describes frontal headache, facial pain, dental pain, and otic pain. She's had associated sweats.Some dyspnea.  She is having some discomfort anterior chest which he relates to coughing   Review of Systems  She is not having fever or chills. She is not having otic discharge. No wheezing.     Objective:   Physical Exam General appearance:good health ;well nourished; no acute distress or increased work of breathing is present.  No  lymphadenopathy about the head, neck, or axilla noted.   Eyes: No conjunctival inflammation or lid edema is present.   Ears:  External ear exam shows no significant lesions or deformities.  Otoscopic examination reveals clear canals, tympanic membranes are intact bilaterally without bulging, retraction, inflammation or discharge.  Nose:  External nasal examination shows no deformity or inflammation. Nasal mucosa are pink and moist without lesions or exudates. No septal dislocation or deviation.No obstruction to airflow.   Oral exam: Dental hygiene is good; lips and gums are healthy appearing.There is no oropharyngeal erythema or exudate noted.   Neck:  No deformities, thyromegaly, masses, or tenderness noted.    Heart:  Normal rate and regular rhythm. S1 and S2 normal without gallop, murmur, click, rub or other extra sounds. S4  Lungs:Chest clear to auscultation; no wheezes, rhonchi,rales ,or rubs present.No increased work of breathing.    Extremities:  No cyanosis, edema, or clubbing  noted    Skin: damp         Assessment & Plan:  #1 bronchitis with purulent secretions  #2 sinusitis, acute  Plan:  See orders

## 2013-01-13 NOTE — Patient Instructions (Signed)

## 2013-04-28 ENCOUNTER — Other Ambulatory Visit (INDEPENDENT_AMBULATORY_CARE_PROVIDER_SITE_OTHER): Payer: BC Managed Care – PPO

## 2013-04-28 ENCOUNTER — Ambulatory Visit (INDEPENDENT_AMBULATORY_CARE_PROVIDER_SITE_OTHER): Payer: BC Managed Care – PPO | Admitting: Internal Medicine

## 2013-04-28 ENCOUNTER — Encounter: Payer: Self-pay | Admitting: Internal Medicine

## 2013-04-28 VITALS — BP 130/86 | HR 77 | Temp 98.6°F | Wt 232.2 lb

## 2013-04-28 DIAGNOSIS — R0789 Other chest pain: Secondary | ICD-10-CM

## 2013-04-28 DIAGNOSIS — I479 Paroxysmal tachycardia, unspecified: Secondary | ICD-10-CM

## 2013-04-28 LAB — CBC WITH DIFFERENTIAL/PLATELET
Basophils Absolute: 0 10*3/uL (ref 0.0–0.1)
Basophils Relative: 0.6 % (ref 0.0–3.0)
Eosinophils Absolute: 0.3 10*3/uL (ref 0.0–0.7)
Eosinophils Relative: 3.9 % (ref 0.0–5.0)
HCT: 47.8 % — ABNORMAL HIGH (ref 36.0–46.0)
Hemoglobin: 15.8 g/dL — ABNORMAL HIGH (ref 12.0–15.0)
Lymphocytes Relative: 27.9 % (ref 12.0–46.0)
Lymphs Abs: 2.2 10*3/uL (ref 0.7–4.0)
MCHC: 33.1 g/dL (ref 30.0–36.0)
MCV: 87.9 fl (ref 78.0–100.0)
Monocytes Absolute: 0.6 10*3/uL (ref 0.1–1.0)
Monocytes Relative: 7 % (ref 3.0–12.0)
Neutro Abs: 4.8 10*3/uL (ref 1.4–7.7)
Neutrophils Relative %: 60.6 % (ref 43.0–77.0)
Platelets: 302 10*3/uL (ref 150.0–400.0)
RBC: 5.44 Mil/uL — ABNORMAL HIGH (ref 3.87–5.11)
RDW: 13.6 % (ref 11.5–14.6)
WBC: 8 10*3/uL (ref 4.5–10.5)

## 2013-04-28 LAB — TSH: TSH: 1.46 u[IU]/mL (ref 0.35–5.50)

## 2013-04-28 LAB — T4, FREE: Free T4: 0.88 ng/dL (ref 0.60–1.60)

## 2013-04-28 MED ORDER — PROPRANOLOL HCL 10 MG PO TABS: ORAL_TABLET | ORAL | Status: DC

## 2013-04-28 NOTE — Progress Notes (Signed)
   Subjective:    Patient ID: [name] [name], female    DOB: 02-26-1955, 58 y.o.   MRN: [id number]  HPI    She's had intermittent tachycardia since 04/23/2013. The trigger for this is stress, particularly work. The symptoms can last seconds to 30 minutes. She's also had a strange sensation in her chest without true pain. There is no associated nausea or diaphoresis.  The symptoms occur at rest. She states that he would think about work and bring them on.  She increased her sertraline to 100 mg daily  > 1 week ago after discussing this with the nurse at her place of work. This has not helped.  Review of the chart reveals normal thyroid function test in June 2014.:There are no catecholamines  & metanephrines on record.    Review of Systems  She does not describe exertional chest pain.  There is no definite paroxysmal hypertension with tachycardia.  There is no constellation of headache, flushing, diarrhea.  She's had no dyspnea, cough, or hemoptysis.  She also denies significant dyspepsia, dysphagia, melena, or rectal bleeding.       Objective:   Physical Exam  Appears  well-nourished & in no acute distress. Slight weight excess  No carotid bruits are present.No neck pain distention present at 10 - 15 degrees. Thyroid normal to palpation  Heart rhythm and rate are normal with no gallop or murmur  Chest is clear with no increased work of breathing  There is no evidence of aortic aneurysm or renal artery bruits  Abdomen soft with no organomegaly or masses. No HJR  No clubbing, cyanosis or edema present.  Pedal pulses are intact  Homan's negative  No ischemic skin changes are present .Skin damp Fingernails healthy   Alert and oriented. Strength, tone, DTRs reflexes normal          Assessment & Plan:  #1 paroxysmal tachycardia, stress trigger.  EKG compared to 10/31/11 reveals one PVC. There are minor ST-T changes without definite ischemia.  #2 atypical  chest discomfort  Plan: See orders

## 2013-04-28 NOTE — Patient Instructions (Addendum)
Please review the mental health benefits available to you; this will be  on the back of your insurance card as a [phone].  The urine studies are to  Rule out increased production of stress hormones (metanephrines and catecholamines). Your next office appointment will be determined based upon review of your pending labs . Those instructions will be transmitted to you through My Chart .      To prevent palpitations or premature beats, avoid stimulants such as decongestants, diet pills, nicotine, or caffeine (coffee, tea, cola, or chocolate) to excess.

## 2013-04-28 NOTE — Progress Notes (Signed)
Pre visit review using our clinic review tool, if applicable. No additional management support is needed unless otherwise documented below in the visit note. 

## 2013-04-29 LAB — TROPONIN I: Troponin I: <0.01 ng/mL (ref <0.06)

## 2013-05-05 ENCOUNTER — Other Ambulatory Visit: Payer: BC Managed Care – PPO

## 2013-05-05 DIAGNOSIS — I479 Paroxysmal tachycardia, unspecified: Secondary | ICD-10-CM

## 2013-05-08 LAB — METANEPHRINES, URINE, 24 HOUR
METANEPHRINES UR: 55 ug/(24.h) — AB (ref 90–315)
Metaneph Total, Ur: 294 mcg/24 h (ref 224–832)
Normetanephrine, 24H Ur: 239 mcg/24 h (ref 122–676)

## 2013-05-09 LAB — CATECHOLAMINES, FRACTIONATED, URINE, 24 HOUR
CALCULATED TOTAL (E+ NE): 38 ug/(24.h) (ref 26–121)
Creatinine, Urine mg/day-CATEUR: 1.75 g/(24.h) (ref 0.63–2.50)
Dopamine, 24 hr Urine: 152 mcg/24 h (ref 52–480)
Norepinephrine, 24 hr Ur: 38 mcg/24 h (ref 15–100)
TOTAL VOLUME - CF 24HR U: 900 mL

## 2013-08-27 ENCOUNTER — Encounter: Payer: Self-pay | Admitting: Gastroenterology

## 2014-03-20 ENCOUNTER — Encounter: Payer: Self-pay | Admitting: Internal Medicine

## 2014-03-20 ENCOUNTER — Other Ambulatory Visit (INDEPENDENT_AMBULATORY_CARE_PROVIDER_SITE_OTHER): Payer: BLUE CROSS/BLUE SHIELD

## 2014-03-20 ENCOUNTER — Ambulatory Visit (INDEPENDENT_AMBULATORY_CARE_PROVIDER_SITE_OTHER): Payer: BLUE CROSS/BLUE SHIELD | Admitting: Internal Medicine

## 2014-03-20 ENCOUNTER — Other Ambulatory Visit: Payer: Self-pay | Admitting: Internal Medicine

## 2014-03-20 VITALS — BP 124/90 | HR 91 | Temp 97.9°F | Resp 14 | Ht 66.0 in | Wt 239.5 lb

## 2014-03-20 DIAGNOSIS — Z0000 Encounter for general adult medical examination without abnormal findings: Secondary | ICD-10-CM

## 2014-03-20 DIAGNOSIS — Z0189 Encounter for other specified special examinations: Secondary | ICD-10-CM

## 2014-03-20 DIAGNOSIS — Z86010 Personal history of colonic polyps: Secondary | ICD-10-CM

## 2014-03-20 DIAGNOSIS — R002 Palpitations: Secondary | ICD-10-CM

## 2014-03-20 DIAGNOSIS — M940 Chondrocostal junction syndrome [Tietze]: Secondary | ICD-10-CM

## 2014-03-20 DIAGNOSIS — E785 Hyperlipidemia, unspecified: Secondary | ICD-10-CM

## 2014-03-20 DIAGNOSIS — R9431 Abnormal electrocardiogram [ECG] [EKG]: Secondary | ICD-10-CM

## 2014-03-20 LAB — CBC WITH DIFFERENTIAL/PLATELET
BASOS PCT: 0.5 % (ref 0.0–3.0)
Basophils Absolute: 0 10*3/uL (ref 0.0–0.1)
Eosinophils Absolute: 0.2 10*3/uL (ref 0.0–0.7)
Eosinophils Relative: 3.7 % (ref 0.0–5.0)
HEMATOCRIT: 48.9 % — AB (ref 36.0–46.0)
Hemoglobin: 16.8 g/dL — ABNORMAL HIGH (ref 12.0–15.0)
Lymphocytes Relative: 26.6 % (ref 12.0–46.0)
Lymphs Abs: 1.8 10*3/uL (ref 0.7–4.0)
MCHC: 34.3 g/dL (ref 30.0–36.0)
MCV: 85.3 fl (ref 78.0–100.0)
MONO ABS: 0.5 10*3/uL (ref 0.1–1.0)
MONOS PCT: 7.6 % (ref 3.0–12.0)
Neutro Abs: 4.1 10*3/uL (ref 1.4–7.7)
Neutrophils Relative %: 61.6 % (ref 43.0–77.0)
Platelets: 303 10*3/uL (ref 150.0–400.0)
RBC: 5.73 Mil/uL — ABNORMAL HIGH (ref 3.87–5.11)
RDW: 13.6 % (ref 11.5–15.5)
WBC: 6.7 10*3/uL (ref 4.0–10.5)

## 2014-03-20 LAB — BASIC METABOLIC PANEL
BUN: 19 mg/dL (ref 6–23)
CO2: 30 mEq/L (ref 19–32)
Calcium: 9.5 mg/dL (ref 8.4–10.5)
Chloride: 103 mEq/L (ref 96–112)
Creatinine, Ser: 0.99 mg/dL (ref 0.40–1.20)
GFR: 61.04 mL/min (ref >60.00)
Glucose, Bld: 108 mg/dL — ABNORMAL HIGH (ref 70–99)
POTASSIUM: 4.2 meq/L (ref 3.5–5.1)
SODIUM: 138 meq/L (ref 135–145)

## 2014-03-20 LAB — HEPATIC FUNCTION PANEL
ALK PHOS: 63 U/L (ref 39–117)
ALT: 25 U/L (ref 0–35)
AST: 25 U/L (ref 0–37)
Albumin: 4.5 g/dL (ref 3.5–5.2)
BILIRUBIN DIRECT: 0.1 mg/dL (ref 0.0–0.3)
TOTAL PROTEIN: 7.3 g/dL (ref 6.0–8.3)
Total Bilirubin: 0.5 mg/dL (ref 0.2–1.2)

## 2014-03-20 LAB — TSH: TSH: 2.04 u[IU]/mL (ref 0.35–4.50)

## 2014-03-20 NOTE — Progress Notes (Signed)
Subjective:    Patient ID: [name] [name], female    DOB: 1955-11-10, 59 y.o.   MRN: [id number]  HPI  She is here for a physical;acute issues include "missing heart beat" noted by Nurse @ work. Also she pulled a muscle in the chest wall moving furniture. NSAIDS & muscle relaxant helped.This has been a chronic , recurrent issue.   She is on a modified heart healthy, low-salt diet. There is no regular exercise program at this time. She has been compliant with her medications without adverse effects. At work the diastolic blood pressure has been less than 80. She has no active cardiopulmonary symptoms. There is a strong family history of premature heart attack or stroke in the maternal family.  She had a tubular adenoma removed in April 2011; she would be due for follow-up colonoscopy this year. She has no active GI symptoms.  Review of Systems  Significant headaches, epistaxis,exertional chest pain, palpitations, exertional dyspnea, claudication, paroxysmal nocturnal dyspnea, or edema absent. No GI symptoms , memory loss or myalgias  Unexplained weight loss, abdominal pain, significant dyspepsia, dysphagia, melena, rectal bleeding, or persistently small caliber stools are denied   .   Objective:   Physical Exam  Gen.: Adequately nourished in appearance. Alert, appropriate and cooperative throughout exam. BMI: Appears younger than stated age  Head: Normocephalic without obvious abnormalities Eyes: No corneal or conjunctival inflammation noted. Pupils equal round reactive to light and accommodation. Extraocular motion intact.  Ears: External  ear exam reveals no significant lesions or deformities. Canals clear .TMs normal. Hearing is grossly normal bilaterally. Nose: External nasal exam reveals no deformity or inflammation. Nasal mucosa are pink and moist. No lesions or exudates noted.   Mouth: Oral mucosa and oropharynx reveal no lesions or exudates. Teeth in good repair. Neck: No  deformities, masses, or tenderness noted. Range of motion decreased due to chest wall pain. Thyroid normal Lungs: Normal respiratory effort; chest expands symmetrically. Lungs are clear to auscultation without rales, wheezes, or increased work of breathing. Chest wall tenderness L parasternal area. Heart: Normal rate and rhythm. Normal S1 and S2. No gallop, click, or rub.Rare premature.No murmur. Abdomen: Bowel sounds normal; abdomen soft and nontender. No masses, organomegaly or hernias noted. Genitalia:  as per Gyn                                  Musculoskeletal/extremities: No deformity or scoliosis noted of  the thoracic or lumbar spine.  No clubbing, cyanosis, edema, or significant extremity  deformity noted.  Range of motion normal . Tone & strength normal. Hand joints normal Fingernail  health good. Crepitus of knees ;R>L. Able to lie down & sit up w/o help.  Negative SLR bilaterally Vascular: Carotid, radial artery, dorsalis pedis and  posterior tibial pulses are full and equal. No bruits present. Neurologic: Alert and oriented x3. Deep tendon reflexes symmetrical and normal.  Gait normal    Skin: Intact without suspicious lesions or rashes. Lymph: No cervical, axillary lymphadenopathy present. Psych: Mood and affect are normal. Normally interactive  Assessment & Plan:  #1 comprehensive physical exam; no acute findings  Plan: see Orders  & Recommendations

## 2014-03-20 NOTE — Progress Notes (Signed)
Pre visit review using our clinic review tool, if applicable. No additional management support is needed unless otherwise documented below in the visit note. 

## 2014-03-20 NOTE — Patient Instructions (Signed)
To prevent palpitations or premature beats, avoid stimulants such as decongestants, diet pills, nicotine, or caffeine (coffee, tea, cola, or chocolate) to excess. For the chest wall pain (costochondritis) apply an anti-inflammatory cream such as Zostrix or Aspercreme twice a day or warm compress. Do not apply ice. Aleve 1-2 every 8-12 hours with food and also help decrease inflammation at this site.   Your next office appointment will be determined based upon review of your pending labs  Those instructions will be transmitted to you through My Chart    Critical values will be called. Followup as needed for any active or acute issue. Please report any significant change in your symptoms.

## 2014-03-22 ENCOUNTER — Other Ambulatory Visit: Payer: Self-pay | Admitting: Internal Medicine

## 2014-03-22 DIAGNOSIS — G4733 Obstructive sleep apnea (adult) (pediatric): Secondary | ICD-10-CM

## 2014-03-23 ENCOUNTER — Other Ambulatory Visit (INDEPENDENT_AMBULATORY_CARE_PROVIDER_SITE_OTHER): Payer: BLUE CROSS/BLUE SHIELD

## 2014-03-23 ENCOUNTER — Telehealth: Payer: Self-pay

## 2014-03-23 DIAGNOSIS — R7309 Other abnormal glucose: Secondary | ICD-10-CM

## 2014-03-23 LAB — HEMOGLOBIN A1C: Hgb A1c MFr Bld: 6.2 % (ref 4.6–6.5)

## 2014-03-23 LAB — NMR LIPOPROFILE WITH LIPIDS
Cholesterol, Total: 155 mg/dL (ref 100–199)
HDL Particle Number: 27.9 umol/L — ABNORMAL LOW (ref >=30.5)
HDL SIZE: 8.6 nm — AB (ref >=9.2)
HDL-C: 39 mg/dL — AB (ref >39)
LDL (calc): 81 mg/dL (ref 0–99)
LDL PARTICLE NUMBER: 1426 nmol/L — AB (ref <1000)
LDL SIZE: 19.8 nm (ref >=20.8)
LP-IR SCORE: 70 — AB (ref <=45)
Large HDL-P: <1.3 umol/L — ABNORMAL LOW (ref >=4.8)
Large VLDL-P: 2.6 nmol/L (ref <=2.7)
Small LDL Particle Number: 1061 nmol/L — ABNORMAL HIGH (ref <=527)
Triglycerides: 174 mg/dL — ABNORMAL HIGH (ref 0–149)
VLDL Size: 45.9 nm (ref <=46.6)

## 2014-03-23 NOTE — Telephone Encounter (Signed)
-----   Message from Hendricks Limes, MD sent at 03/22/2014  9:20 AM EST ----- Please add A1c (R73.9)

## 2014-03-23 NOTE — Telephone Encounter (Signed)
Request for add on has been faxed to [facility] 

## 2014-05-05 ENCOUNTER — Encounter: Payer: Self-pay | Admitting: Gastroenterology

## 2014-05-21 ENCOUNTER — Ambulatory Visit (INDEPENDENT_AMBULATORY_CARE_PROVIDER_SITE_OTHER): Payer: BLUE CROSS/BLUE SHIELD | Admitting: Cardiovascular Disease

## 2014-05-21 ENCOUNTER — Encounter: Payer: Self-pay | Admitting: Cardiovascular Disease

## 2014-05-21 VITALS — BP 142/78 | HR 81 | Ht 66.0 in | Wt 238.0 lb

## 2014-05-21 DIAGNOSIS — I499 Cardiac arrhythmia, unspecified: Secondary | ICD-10-CM | POA: Diagnosis not present

## 2014-05-21 DIAGNOSIS — R011 Cardiac murmur, unspecified: Secondary | ICD-10-CM | POA: Diagnosis not present

## 2014-05-21 DIAGNOSIS — R9431 Abnormal electrocardiogram [ECG] [EKG]: Secondary | ICD-10-CM

## 2014-05-21 DIAGNOSIS — E785 Hyperlipidemia, unspecified: Secondary | ICD-10-CM | POA: Diagnosis not present

## 2014-05-21 NOTE — Patient Instructions (Signed)
Your physician has requested that you have an echocardiogram. Echocardiography is a painless test that uses sound waves to create images of your heart. It provides your doctor with information about the size and shape of your heart and how well your heart's chambers and valves are working. This procedure takes approximately one hour. There are no restrictions for this procedure.  Your physician has requested that you have an exercise tolerance test. For further information please visit [email or web address]. Please also follow instruction sheet, as given.  Dr. Sallyanne Kuster recommends that you schedule a follow-up appointment in: AS NEEDED.  WE WILL CALL YOU WITH THE RESULTS OF THE ABOVE TESTS.

## 2014-05-21 NOTE — Progress Notes (Signed)
Patient ID: [name] [name], female   DOB: 07-14-1955, 59 y.o.   MRN: [id number]     Cardiology Office Note   Date:  05/21/2014   ID:  [name] [name], DOB 09/18/1955, MRN [id number]  PCP:  Unice Cobble, MD  Cardiologist:   Sanda Klein, MD   Chief complaint: Arrhythmia and abnormal ECG     History of Present Illness: [name] [name] is a 59 y.o. female who presents for follow-up after a nurse exam at her workplace diagnosed an irregular heart rhythm. The patient is not aware of palpitations. She also has a history of ECG abnormalities (left axis deviation). She describes having "racing heart" many years ago when she was under tremendous emotional stress. She took propranolol briefly at that time but this is no longer an active prescription. She is very sedentary. She has numerous cardiac risk factors including systemic hypertension, treated hyperlipidemia and a family history of coronary problems. As borderline diabetes mellitus and the metabolic syndrome. She does not smoke.  She denies exertional dyspnea or angina, but admits to being "out of shape". She denies lower extremity edema and has never experienced syncope or near syncope. She does not have a history of focal neurological events or intermittent claudication. She has been diagnosed with mild obstructive sleep apnea without meeting indications for CPAP therapy.  She was told she has a murmur since young adulthood.  Review of old electrocardiograms dating back to 2013's shows a consistent pattern of left axis deviation due to left anterior fascicular block. Electrocardiogram from April 2015 shows a single PVC. No other arrhythmias identified. She has never had a stress test or echocardiogram.  She has pain and numbness in her left shoulder and left arm that appears to be positional and felt to be related to cervical radiculopathy. She just had an MRI scan last night.  Past Medical History  Diagnosis Date  .  Hyperlipemia   . Hypertension   . Hypothyroidism   . Cholelithiasis   . Hepatic steatosis   . PONV (postoperative nausea and vomiting)   . Arthritis     Past Surgical History  Procedure Laterality Date  . Abdominal hysterectomy      @ age 23, due to fibroids   . Tonsillectomy    . Mohs surgery  2004    Basal Cell   . Colonoscopy w/ polypectomy  2010     Dr.Jacobs, due 2015   . Dilation and curettage of uterus    . Total knee arthroplasty  09/22/2011    Dr Nicholes Stairs, Left;     Current Outpatient Prescriptions  Medication Sig Dispense Refill  . CRESTOR 20 MG tablet TAKE 1 TABLET BY MOUTH DAILY 90 tablet 3  . guaiFENesin (MUCINEX) 600 MG 12 hr tablet Take 600 mg by mouth daily.    . hydrochlorothiazide (HYDRODIURIL) 25 MG tablet Take 12.5 mg by mouth daily.     Marland Kitchen HYDROcodone-acetaminophen (NORCO) 10-325 MG per tablet Take 1-2 tablets by mouth every 6 (six) hours as needed for moderate pain or severe pain.    Marland Kitchen levothyroxine (SYNTHROID, LEVOTHROID) 100 MCG tablet Take 100 mcg by mouth daily.     Marland Kitchen losartan (COZAAR) 100 MG tablet TAKE 1 TABLET DAILY 90 tablet 3  . mometasone (NASONEX) 50 MCG/ACT nasal spray Place 2 sprays into the nose daily.    . predniSONE (DELTASONE) 5 MG tablet Take 5 mg by mouth daily. Take 1/2 tablet daily.    . propranolol (INDERAL) 10 MG tablet 1  every 8 hrs prn 30 tablet 1  . sertraline (ZOLOFT) 50 MG tablet Take 50 mg by mouth daily.     No current facility-administered medications for this visit.    Allergies:   Ampicillin; Oxycodone; and Doxycycline    Social History:  The patient  reports that she has never smoked. She does not have any smokeless tobacco history on file. She reports that she drinks alcohol. She reports that she does not use illicit drugs.   Family History:  The patient's family history includes Alcohol abuse in her father; Diabetes in her brother and mother; Heart attack in her maternal uncle; Leukemia in her mother; Lung cancer in  her father; Stroke in her maternal uncle.    ROS:  Please see the history of present illness.    Otherwise, review of systems positive for none.   All other systems are reviewed and negative.    PHYSICAL EXAM: VS:  BP 142/78 mmHg  Pulse 81  Ht 5\' 6"  (1.676 m)  Wt 238 lb (107.956 kg)  BMI 38.43 kg/m2 , BMI Body mass index is 38.43 kg/(m^2).  General: Alert, oriented x3, no distress Head: no evidence of trauma, PERRL, EOMI, no exophtalmos or lid lag, no myxedema, no xanthelasma; normal ears, nose and oropharynx Neck: normal jugular venous pulsations and no hepatojugular reflux; brisk carotid pulses without delay and no carotid bruits Chest: clear to auscultation, no signs of consolidation by percussion or palpation, normal fremitus, symmetrical and full respiratory excursions Cardiovascular: normal position and quality of the apical impulse, regular rhythm, normal first and second heart sounds, no murmurs, rubs or gallops Abdomen: no tenderness or distention, no masses by palpation, no abnormal pulsatility or arterial bruits, normal bowel sounds, no hepatosplenomegaly Extremities: no clubbing, cyanosis or edema; 2+ radial, ulnar and brachial pulses bilaterally; 2+ right femoral, posterior tibial and dorsalis pedis pulses; 2+ left femoral, posterior tibial and dorsalis pedis pulses; no subclavian or femoral bruits Neurological: grossly nonfocal Psych: euthymic mood, full affect   EKG:  EKG is ordered today. The ekg ordered today demonstrates NSR, LAFB   Recent Labs: 03/20/2014: ALT 25; BUN 19; Creatinine 0.99; Hemoglobin 16.8*; Platelets 303.0; Potassium 4.2; Sodium 138; TSH 2.04    Lipid Panel    Component Value Date/Time   CHOL 155 03/20/2014 1049   CHOL 139 06/25/2012 0806   TRIG 174* 03/20/2014 1049   TRIG 115.0 06/25/2012 0806   HDL 39* 03/20/2014 1049   HDL 32.60* 06/25/2012 0806   CHOLHDL 4 06/25/2012 0806   VLDL 23.0 06/25/2012 0806   LDLCALC 81 03/20/2014 1049    LDLCALC 83 06/25/2012 0806   LDLDIRECT 86.7 02/09/2010 1138      Wt Readings from Last 3 Encounters:  05/21/14 238 lb (107.956 kg)  03/20/14 239 lb 8 oz (108.636 kg)  04/28/13 232 lb 3.2 oz (105.325 kg)      ASSESSMENT AND PLAN:  1. Arrhythmia- by history this appears to be isolated ectopy, probably PVCs as noted on the electrocardiogram from April 2015. These are not symptomatic and did not require specific therapy, as long as there is no evidence of significant structural heart disease. 2. Multiple coronary risk factors and sedentary lifestyle- I think it wise for her to undergo a treadmill stress test. Multiple EKGs performed during that test may also show [place] the mechanism of her irregular heart rhythm  3. Left anterior fascicular block- this is a chronic abnormality and likely not relevant clinically 4. Mixed hyperlipidemia with good total cholesterol  LDL cholesterol, but low HDL and elevated triglycerides, indeed consistent with insulin resistance and the metabolic syndrome, placing her at higher risk of developing vascular complications. We discussed weight loss, steadily increasing physical exercise, a diet low in carbohydrates especially low in sweets and starches with high glycemic index. 5. Essential hypertension, usually with excellent control borderline high today. 6. Hypothyroidism on hormone replacement therapy with normal recent TSH   Current medicines are reviewed at length with the patient today.  The patient does not have concerns regarding medicines.  The following changes have been made:  no change  Labs/ tests ordered today include:  Orders Placed This Encounter  Procedures  . Exercise Tolerance Test  . EKG 12-Lead  . Echocardiogram    If her echocardiogram and stress test showed benign findings, I don't think she requires routine cardiology follow-up, but will be glad to assist with future events or problems. If on the other hand were identified structural  cardiac abnormalities on the noninvasive tests, we'll bring her back for office appointment to discuss these.  Patient Instructions  Your physician has requested that you have an echocardiogram. Echocardiography is a painless test that uses sound waves to create images of your heart. It provides your doctor with information about the size and shape of your heart and how well your heart's chambers and valves are working. This procedure takes approximately one hour. There are no restrictions for this procedure.  Your physician has requested that you have an exercise tolerance test. For further information please visit [email or web address]. Please also follow instruction sheet, as given.  Dr. Sallyanne Kuster recommends that you schedule a follow-up appointment in: AS NEEDED.  WE WILL CALL YOU WITH THE RESULTS OF THE ABOVE TESTS.      Mikael Spray, MD  05/21/2014 6:18 PM    Sanda Klein, MD, [facility] [phone] office [phone] pager

## 2014-06-10 ENCOUNTER — Ambulatory Visit: Payer: BLUE CROSS/BLUE SHIELD | Attending: Orthopedic Surgery | Admitting: Physical Therapy

## 2014-06-10 ENCOUNTER — Encounter: Payer: Self-pay | Admitting: Physical Therapy

## 2014-06-10 DIAGNOSIS — M436 Torticollis: Secondary | ICD-10-CM | POA: Diagnosis not present

## 2014-06-10 DIAGNOSIS — M542 Cervicalgia: Secondary | ICD-10-CM | POA: Diagnosis not present

## 2014-06-10 DIAGNOSIS — M792 Neuralgia and neuritis, unspecified: Secondary | ICD-10-CM | POA: Diagnosis not present

## 2014-06-10 NOTE — Therapy (Signed)
[facility] [address]  [place], [place], [address] Phone: [phone]   Fax:  [phone]  Physical Therapy Evaluation  Patient Details  Name: [name] [name] MRN: [id number] Date of Birth: September 21, 1955 Referring Provider:  Earlie Server, MD  Encounter Date: 06/10/2014      PT End of Session - 06/10/14 0857    Visit Number 1   Number of Visits 12   Date for PT Re-Evaluation 07/22/14   PT Start Time 0850   PT Stop Time 0953   PT Time Calculation (min) 63 min      Past Medical History  Diagnosis Date  . Hyperlipemia   . Hypertension   . Hypothyroidism   . Cholelithiasis   . Hepatic steatosis   . PONV (postoperative nausea and vomiting)   . Arthritis     Past Surgical History  Procedure Laterality Date  . Abdominal hysterectomy      @ age 40, due to fibroids   . Tonsillectomy    . Mohs surgery  2004    Basal Cell   . Colonoscopy w/ polypectomy  2010     Dr.Jacobs, due 2015   . Dilation and curettage of uterus    . Total knee arthroplasty  09/22/2011    Dr Nicholes Stairs, Left;    There were no vitals filed for this visit.  Visit Diagnosis:  Neck pain - Plan: PT plan of care cert/re-cert  Neck stiffness - Plan: PT plan of care cert/re-cert  Radicular pain in left arm - Plan: PT plan of care cert/re-cert      Subjective Assessment - 06/10/14 0853    Subjective pt with chronic neck pain over the past couple years but noted sharp increase in pain 2 months ago afte moving chair in her home.  States noted pain and N/T throughout L UE which was especially bad with prone lying at night with head turned to L.  Went to MD around 05/13/14 and underwent MRI on 05/20/14.  States meds have helped (prednisone especially) but does not tolerate medication very well.   Diagnostic tests MRI indicates large L disc extrusion causing L cord and central canal stenosis along with moderate to severe L foraminal stenosis   all at C6-7.   Patient Stated Goals avoid surgery   Currently in Pain? Yes   Pain Score 2   Worst pain 4/10 in past week but up to 10/10 prior to medication   Pain Radiating Towards L scapula and L UE   Pain Onset More than a month ago   Pain Frequency Constant   Aggravating Factors  c-spine L rotation (prone lying)   Pain Relieving Factors medication, massage to scapula   Multiple Pain Sites No            [facility] PT Assessment - 06/10/14 0001    Assessment   Medical Diagnosis neck pain   Onset Date 01/24/12   Balance Screen   Has the patient fallen in the past 6 months No   Has the patient had a decrease in activity level because of a fear of falling?  No   Is the patient reluctant to leave their home because of a fear of falling?  No   Prior Function   Vocation Full time employment   Leisure enjoys being active around the house and yard   Observation/Other Assessments   Focus on Therapeutic Outcomes (FOTO)  48% limitation   Posture/Postural Control  Posture Comments mild forward head and rounded shoulders   ROM / Strength   AROM / PROM / Strength AROM;Strength   AROM   AROM Assessment Site Cervical   Cervical Flexion WFL  painful   Cervical Extension 42  L posterior neck pain to scapula   Cervical - Right Side Bend 27  L neck pain   Cervical - Left Side Bend 13  L Neck pain   Cervical - Right Rotation 58   Cervical - Left Rotation 44  L neck pain   Strength   Overall Strength Comments B UE 5/5 no pain noted during assessment other than mild pain with L IR.  Noted increased soreness after testing however.   Strength Assessment Site --        TODAY'S TREATMENT TherEx - HEP instruct and perform Chin Tuck, Biochemist, clinical with neutral c-spine, Low Row with Black TB Posture and body mechanics training (especially related to computer work)  Ecologist - c-spine, 20dg pull, 9#/4#, 60"/20", 15' (pt could hardly feel this, will increase to 12# next  treatment)                   PT Education - 06/10/14 0931    Education provided Yes   Education Details HEP   Person(s) Educated Patient   Methods Explanation;Demonstration;Handout   Comprehension Verbalized understanding;Returned demonstration          PT Short Term Goals - 06/10/14 1114    PT SHORT TERM GOAL #1   Title pt independent with HEP by 06/19/14   Status New           PT Long Term Goals - 06/10/14 1114    PT LONG TERM GOAL #1   Title pt independent with advanced HEP as necessary by 07/22/14   Status New   PT LONG TERM GOAL #2   Title c-spine B Rotation and SB within 5 degree of symmetry without c/o pain or radicular symptoms by 07/22/14   Status New   PT LONG TERM GOAL #3   Title pt able to perform work duties, chores, and ADLS without limitation by neck pain, LOM, or radicular symptoms by 07/22/14.   Status New   PT LONG TERM GOAL #4   Title pt able to sleep without limitation by pain or radicular symptoms by 07/22/14   Status New               Plan - 06/10/14 0941    Clinical Impression Statement pt with neck pain and L UE radicular symptoms over the past 2 months.  MRI indicates significant central canal and foraminal stenosis related to HNP at C6-7. Pt with c-spine LOM into Extension, L Rotation, and L Side-bending all which create radicular symptoms.  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, occupational)   Pt will benefit from skilled therapeutic intervention in order to improve on the following deficits Postural dysfunction;Decreased range of motion;Pain;Improper body mechanics   Rehab Potential Good   PT Frequency 2x / week   PT Duration 6 weeks   PT Treatment/Interventions Manual techniques;Traction;Therapeutic exercise;Patient/family education;ADLs/Self Care Home Management;Electrical Stimulation;Moist  Heat;Cryotherapy;Ultrasound;Therapeutic activities;Dry needling   PT Next Visit Plan manual and traction to address neck mobility and radicular symptoms, progress HEP as pt progresses and minimize amount of time pt in clinic to limit cost to patient  Consulted and Agree with Plan of Care Patient         Problem List Patient Active Problem List   Diagnosis Date Noted  . Arrhythmia 05/21/2014  . Nonspecific abnormal electrocardiogram (ECG) (EKG) 03/20/2014  . OSA (obstructive sleep apnea) 09/06/2012  . Allergic rhinoconjunctivitis, seasonal and perennial 07/02/2012  . History of colonic polyps 02/09/2010  . METABOLIC SYNDROME X 63/14/9702  . GERD 09/11/2008  . CHOLELITHIASIS 09/11/2008  . FATTY LIVER DISEASE, HX OF 09/11/2008  . HYPOTHYROIDISM 09/11/2007  . Hyperlipidemia 09/11/2007  . HYPERTENSION 09/11/2007  . MEMORY LOSS 09/11/2007  . CARCINOMA, BASAL CELL, HX OF 05/09/2006    Arlene Genova PT, OCS 06/10/2014, 11:20 AM  [facility] [place], [place], [address] Phone: [phone]   Fax:  [phone]

## 2014-06-16 ENCOUNTER — Ambulatory Visit: Payer: BLUE CROSS/BLUE SHIELD | Admitting: Physical Therapy

## 2014-06-16 DIAGNOSIS — M542 Cervicalgia: Secondary | ICD-10-CM

## 2014-06-16 DIAGNOSIS — M436 Torticollis: Secondary | ICD-10-CM

## 2014-06-16 DIAGNOSIS — M792 Neuralgia and neuritis, unspecified: Secondary | ICD-10-CM

## 2014-06-16 NOTE — Therapy (Signed)
[facility] [address]  [place], [place], [address] Phone: [phone]   Fax:  [phone]  Physical Therapy Treatment  Patient Details  Name: [name] [name] MRN: [id number] Date of Birth: 1955-10-16 Referring Provider:  Hendricks Limes, MD  Encounter Date: 06/16/2014      PT End of Session - 06/16/14 1544    Visit Number 2   Number of Visits 12   Date for PT Re-Evaluation 07/22/14   PT Start Time 1536   PT Stop Time 1623   PT Time Calculation (min) 47 min      Past Medical History  Diagnosis Date  . Hyperlipemia   . Hypertension   . Hypothyroidism   . Cholelithiasis   . Hepatic steatosis   . PONV (postoperative nausea and vomiting)   . Arthritis     Past Surgical History  Procedure Laterality Date  . Abdominal hysterectomy      @ age 24, due to fibroids   . Tonsillectomy    . Mohs surgery  2004    Basal Cell   . Colonoscopy w/ polypectomy  2010     Dr.Jacobs, due 2015   . Dilation and curettage of uterus    . Total knee arthroplasty  09/22/2011    Dr Nicholes Stairs, Left;    There were no vitals filed for this visit.  Visit Diagnosis:  Neck pain  Neck stiffness  Radicular pain in left arm      Subjective Assessment - 06/16/14 1538    Subjective States felt better after initial treatment stating symtoms were gone for one day but then returned.  States has been performing HEP and states notes soreness within 1 hour after completing them.   Currently in Pain? Yes   Pain Score 4    Pain Location Neck   Pain Orientation Left   Pain Descriptors / Indicators Aching   Multiple Pain Sites No        TODAY'S TREATMENT: Manual - pt prone-lying PA mobs grade 3 to mid and upper t-spine Supine c-spine distraction and gentle retraction mobes (notes L UE pain so stopped), distraction alone felt good, stretch with STM to B LS and UT, c-spine ext mobes to lower and mid c-spine (well tolerated  stating feels good), B SB and Rot mobes to tolerance (poor tolerance with rotation or SB to L)  Mechanical Traction - c-spine, 20dg pull, 60"/20", 12#/6#, 93'           PT Short Term Goals - 06/16/14 1610    PT SHORT TERM GOAL #1   Title pt independent with HEP by 06/19/14   Status Achieved           PT Long Term Goals - 06/16/14 1610    PT LONG TERM GOAL #1   Title pt independent with advanced HEP as necessary by 07/22/14   Status On-going   PT LONG TERM GOAL #2   Title c-spine B Rotation and SB within 5 degree of symmetry without c/o pain or radicular symptoms by 07/22/14   Status On-going   PT LONG TERM GOAL #3   Title pt able to perform work duties, chores, and ADLS without limitation by neck pain, LOM, or radicular symptoms by 07/22/14.   Status On-going   PT LONG TERM GOAL #4   Title pt able to sleep without limitation by pain or radicular symptoms by 07/22/14   Status On-going  Plan - 06/16/14 1610    Clinical Impression Statement Pt with good response to initial treatment performed day of evaluation (some exercise along with mechanical traction).  Limited tolerance with manual today into L ROT or L SB as well as with retraction mobes (lower c-spine ext mobes and t-spine extensionm mobes well tolerated however).   PT Next Visit Plan manual and traction to address neck mobility and radicular symptoms, try foam roll exercises, progress HEP as pt progresses, minimize amount of time pt in clinic to limit cost to patient   Consulted and Agree with Plan of Care Patient        Problem List Patient Active Problem List   Diagnosis Date Noted  . Arrhythmia 05/21/2014  . Nonspecific abnormal electrocardiogram (ECG) (EKG) 03/20/2014  . OSA (obstructive sleep apnea) 09/06/2012  . Allergic rhinoconjunctivitis, seasonal and perennial 07/02/2012  . History of colonic polyps 02/09/2010  . METABOLIC SYNDROME X 26/71/2458  . GERD 09/11/2008  .  CHOLELITHIASIS 09/11/2008  . FATTY LIVER DISEASE, HX OF 09/11/2008  . HYPOTHYROIDISM 09/11/2007  . Hyperlipidemia 09/11/2007  . HYPERTENSION 09/11/2007  . MEMORY LOSS 09/11/2007  . CARCINOMA, BASAL CELL, HX OF 05/09/2006    Yoskar Murrillo PT, OCS 06/16/2014, 4:28 PM  [facility] [place], [place], [address] Phone: [phone]   Fax:  [phone]

## 2014-06-18 ENCOUNTER — Ambulatory Visit: Payer: BLUE CROSS/BLUE SHIELD | Admitting: Physical Therapy

## 2014-06-18 DIAGNOSIS — M542 Cervicalgia: Secondary | ICD-10-CM

## 2014-06-18 DIAGNOSIS — M792 Neuralgia and neuritis, unspecified: Secondary | ICD-10-CM

## 2014-06-18 DIAGNOSIS — M436 Torticollis: Secondary | ICD-10-CM

## 2014-06-18 NOTE — Therapy (Signed)
[facility] [address]  [place], [place], [address] Phone: [phone]   Fax:  [phone]  Physical Therapy Treatment  Patient Details  Name: [name] [name] MRN: [id number] Date of Birth: 11-04-1955 Referring Provider:  Earlie Server, MD  Encounter Date: 06/18/2014      PT End of Session - 06/18/14 1320    Visit Number 3   Number of Visits 12   Date for PT Re-Evaluation 07/22/14   PT Start Time 1324   PT Stop Time 1410   PT Time Calculation (min) 57 min      Past Medical History  Diagnosis Date  . Hyperlipemia   . Hypertension   . Hypothyroidism   . Cholelithiasis   . Hepatic steatosis   . PONV (postoperative nausea and vomiting)   . Arthritis     Past Surgical History  Procedure Laterality Date  . Abdominal hysterectomy      @ age 59, due to fibroids   . Tonsillectomy    . Mohs surgery  2004    Basal Cell   . Colonoscopy w/ polypectomy  2010     Dr.Jacobs, due 2015   . Dilation and curettage of uterus    . Total knee arthroplasty  09/22/2011    Dr Nicholes Stairs, Left;    There were no vitals filed for this visit.  Visit Diagnosis:  Neck pain  Neck stiffness  Radicular pain in left arm      Subjective Assessment - 06/18/14 1315    Subjective States notes intermittent n/t into L hand past 2 days for unknown reason (states she had not noted this lately, since taking prednisone).  Mild ache in L shoulder/scapula but nothing like it was 2 days ago.   Currently in Pain? Yes   Pain Score 1    Pain Location Neck  upper scapula   Pain Orientation Left   Multiple Pain Sites No         TODAY'S TREATMENT: TherEx - Hooklying on 1/2 FoamRoll: Pec Stretch 2', Pullover 5# 10x, c-spine B rotation AROM 10x each, B Horiz ABD Blue TB 15x, Pullover with Red TB loop for horiz ABD isometric 10x POE serratus press with chin tuck 10x5" POE serratus press with c-spine AROM B rotation 6x  each  Manual - L UE nerve glides (POS ULTT on L) - notes L Upper Trap pain after nerve glides completed so performed TPR to L UE which responded well but once this was done she began feeling n/t into L UE between elbow and hand.  So performed supine c-spine extension mobes and all symptoms abolished.  Mechanical Traction - c-spine, 20dg pull, 60"/20", 14#/7#, 18'                          PT Short Term Goals - 06/16/14 1610    PT SHORT TERM GOAL #1   Title pt independent with HEP by 06/19/14   Status Achieved           PT Long Term Goals - 06/16/14 1610    PT LONG TERM GOAL #1   Title pt independent with advanced HEP as necessary by 07/22/14   Status On-going   PT LONG TERM GOAL #2   Title c-spine B Rotation and SB within 5 degree of symmetry without c/o pain or radicular symptoms by 07/22/14   Status On-going   PT LONG TERM GOAL #3  Title pt able to perform work duties, chores, and ADLS without limitation by neck pain, LOM, or radicular symptoms by 07/22/14.   Status On-going   PT LONG TERM GOAL #4   Title pt able to sleep without limitation by pain or radicular symptoms by 07/22/14   Status On-going               Plan - 06/18/14 1356    Clinical Impression Statement pt with irritable L ULTT and UT.  Best response noted with combo of TPR to UT and c-spine extension mobes today.  Otherwise, she seemed to perform well with therEx portion of today's treatment.  Pt is going to be out of town next week so will not be back to PT for nearly 2 weeks.  Advised her to continue with current HEP and to not progress to any POE exercises yet.   PT Next Visit Plan re-assess, manual and traction to address neck mobility and radicular symptoms, progress foam roll exercises and HEP as able   Consulted and Agree with Plan of Care Patient        Problem List Patient Active Problem List   Diagnosis Date Noted  . Arrhythmia 05/21/2014  . Nonspecific abnormal  electrocardiogram (ECG) (EKG) 03/20/2014  . OSA (obstructive sleep apnea) 09/06/2012  . Allergic rhinoconjunctivitis, seasonal and perennial 07/02/2012  . History of colonic polyps 02/09/2010  . METABOLIC SYNDROME X 29/52/8413  . GERD 09/11/2008  . CHOLELITHIASIS 09/11/2008  . FATTY LIVER DISEASE, HX OF 09/11/2008  . HYPOTHYROIDISM 09/11/2007  . Hyperlipidemia 09/11/2007  . HYPERTENSION 09/11/2007  . MEMORY LOSS 09/11/2007  . CARCINOMA, BASAL CELL, HX OF 05/09/2006    Shannan Slinker PT, OCS 06/18/2014, 2:01 PM  [facility] [address]  [place], [place], [address] Phone: [phone]   Fax:  [phone]

## 2014-06-23 ENCOUNTER — Encounter (HOSPITAL_COMMUNITY): Payer: BLUE CROSS/BLUE SHIELD

## 2014-06-23 ENCOUNTER — Other Ambulatory Visit (HOSPITAL_COMMUNITY): Payer: BLUE CROSS/BLUE SHIELD

## 2014-06-24 ENCOUNTER — Encounter: Payer: BLUE CROSS/BLUE SHIELD | Admitting: Rehabilitation

## 2014-06-25 ENCOUNTER — Telehealth (HOSPITAL_COMMUNITY): Payer: Self-pay

## 2014-06-25 ENCOUNTER — Telehealth (HOSPITAL_COMMUNITY): Payer: Self-pay | Admitting: *Deleted

## 2014-06-25 ENCOUNTER — Encounter: Payer: BLUE CROSS/BLUE SHIELD | Admitting: Rehabilitation

## 2014-06-25 NOTE — Telephone Encounter (Signed)
Encounter complete. 

## 2014-06-30 ENCOUNTER — Ambulatory Visit (HOSPITAL_BASED_OUTPATIENT_CLINIC_OR_DEPARTMENT_OTHER)
Admission: RE | Admit: 2014-06-30 | Discharge: 2014-06-30 | Disposition: A | Discharge status: Home / Self Care | Payer: BLUE CROSS/BLUE SHIELD | Source: Ambulatory Visit | Attending: Cardiovascular Disease | Admitting: Cardiovascular Disease

## 2014-06-30 ENCOUNTER — Other Ambulatory Visit (HOSPITAL_COMMUNITY): Payer: BLUE CROSS/BLUE SHIELD

## 2014-06-30 ENCOUNTER — Ambulatory Visit (HOSPITAL_COMMUNITY)
Admission: RE | Admit: 2014-06-30 | Discharge: 2014-06-30 | Disposition: A | Discharge status: Home / Self Care | Payer: BLUE CROSS/BLUE SHIELD | Source: Ambulatory Visit | Attending: Cardiovascular Disease | Admitting: Cardiovascular Disease

## 2014-06-30 DIAGNOSIS — Z8249 Family history of ischemic heart disease and other diseases of the circulatory system: Secondary | ICD-10-CM | POA: Insufficient documentation

## 2014-06-30 DIAGNOSIS — R9431 Abnormal electrocardiogram [ECG] [EKG]: Secondary | ICD-10-CM | POA: Diagnosis not present

## 2014-06-30 DIAGNOSIS — I10 Essential (primary) hypertension: Secondary | ICD-10-CM | POA: Insufficient documentation

## 2014-06-30 DIAGNOSIS — R011 Cardiac murmur, unspecified: Secondary | ICD-10-CM | POA: Diagnosis not present

## 2014-06-30 DIAGNOSIS — E785 Hyperlipidemia, unspecified: Secondary | ICD-10-CM

## 2014-06-30 DIAGNOSIS — I358 Other nonrheumatic aortic valve disorders: Secondary | ICD-10-CM | POA: Insufficient documentation

## 2014-06-30 LAB — EXERCISE TOLERANCE TEST
CSEPED: 6 min
Estimated workload: 7 METS
Exercise duration (sec): 0 s
MPHR: 161 {beats}/min
Peak HR: 164 {beats}/min
Percent HR: 101 %
RPE: 27680
Rest HR: 104 {beats}/min

## 2014-07-01 ENCOUNTER — Ambulatory Visit: Payer: BLUE CROSS/BLUE SHIELD | Attending: Orthopedic Surgery | Admitting: Physical Therapy

## 2014-07-01 DIAGNOSIS — M436 Torticollis: Secondary | ICD-10-CM | POA: Diagnosis present

## 2014-07-01 DIAGNOSIS — M542 Cervicalgia: Secondary | ICD-10-CM

## 2014-07-01 DIAGNOSIS — M792 Neuralgia and neuritis, unspecified: Secondary | ICD-10-CM

## 2014-07-01 NOTE — Therapy (Signed)
[facility] [address]  [place], [place], [address] Phone: [phone]   Fax:  [phone]  Physical Therapy Treatment  Patient Details  Name: [name] [name] MRN: [id number] Date of Birth: 1955-09-29 Referring Provider:  Earlie Server, MD  Encounter Date: 07/01/2014      PT End of Session - 07/01/14 0931    Visit Number 4   Number of Visits 12   Date for PT Re-Evaluation 07/22/14   PT Start Time 0900   PT Stop Time 0950   PT Time Calculation (min) 50 min      Past Medical History  Diagnosis Date  . Hyperlipemia   . Hypertension   . Hypothyroidism   . Cholelithiasis   . Hepatic steatosis   . PONV (postoperative nausea and vomiting)   . Arthritis     Past Surgical History  Procedure Laterality Date  . Abdominal hysterectomy      @ age 2, due to fibroids   . Tonsillectomy    . Mohs surgery  2004    Basal Cell   . Colonoscopy w/ polypectomy  2010     Dr.Jacobs, due 2015   . Dilation and curettage of uterus    . Total knee arthroplasty  09/22/2011    Dr Nicholes Stairs, Left;    There were no vitals filed for this visit.  Visit Diagnosis:  Neck pain  Neck stiffness  Radicular pain in left arm      Subjective Assessment - 07/01/14 0850    Subjective rough couple of days lately stating fell down stairs at home on Sunday and has noted increased pain (states had almost gone away after last PT session but 5/10 today and 10/10 at worst).   Currently in Pain? Yes   Pain Score 5    Pain Location Neck   Pain Orientation Left   Pain Radiating Towards L scapular and into L UE        TODAY'S TREATMENT TherEx - UBE lvl 1.0 90"/30" stopped BW due to increased pain  Manual - Dry Needling to L UT with good twitch response and pt reports immediate benefit with decreased pain/soreness TPR to L UT, C-spine extension mobes (focused on lower c-spine) with good benefit.  Mechanical Traction - c-spine,  20dg pull, 60"/20", 14#/7#, 18'                           PT Short Term Goals - 06/16/14 1610    PT SHORT TERM GOAL #1   Title pt independent with HEP by 06/19/14   Status Achieved           PT Long Term Goals - 06/16/14 1610    PT LONG TERM GOAL #1   Title pt independent with advanced HEP as necessary by 07/22/14   Status On-going   PT LONG TERM GOAL #2   Title c-spine B Rotation and SB within 5 degree of symmetry without c/o pain or radicular symptoms by 07/22/14   Status On-going   PT LONG TERM GOAL #3   Title pt able to perform work duties, chores, and ADLS without limitation by neck pain, LOM, or radicular symptoms by 07/22/14.   Status On-going   PT LONG TERM GOAL #4   Title pt able to sleep without limitation by pain or radicular symptoms by 07/22/14   Status On-going  Problem List Patient Active Problem List   Diagnosis Date Noted  . Arrhythmia 05/21/2014  . Nonspecific abnormal electrocardiogram (ECG) (EKG) 03/20/2014  . OSA (obstructive sleep apnea) 09/06/2012  . Allergic rhinoconjunctivitis, seasonal and perennial 07/02/2012  . History of colonic polyps 02/09/2010  . METABOLIC SYNDROME X 68/34/1962  . GERD 09/11/2008  . CHOLELITHIASIS 09/11/2008  . FATTY LIVER DISEASE, HX OF 09/11/2008  . HYPOTHYROIDISM 09/11/2007  . Hyperlipidemia 09/11/2007  . HYPERTENSION 09/11/2007  . MEMORY LOSS 09/11/2007  . CARCINOMA, BASAL CELL, HX OF 05/09/2006    Odies Desa PT, OCS 07/01/2014, 9:56 AM  [facility] [place], [place], [address] Phone: [phone]   Fax:  [phone]

## 2014-07-02 ENCOUNTER — Ambulatory Visit: Payer: BLUE CROSS/BLUE SHIELD | Admitting: Physical Therapy

## 2014-07-02 DIAGNOSIS — M542 Cervicalgia: Secondary | ICD-10-CM

## 2014-07-02 DIAGNOSIS — M436 Torticollis: Secondary | ICD-10-CM

## 2014-07-02 DIAGNOSIS — M792 Neuralgia and neuritis, unspecified: Secondary | ICD-10-CM

## 2014-07-02 NOTE — Therapy (Signed)
[facility] [address]  [place], [place], [address] Phone: [phone]   Fax:  [phone]  Physical Therapy Treatment  Patient Details  Name: [name] [name] MRN: [id number] Date of Birth: [birth year] Referring Provider:  Earlie Server, MD  Encounter Date: 07/02/2014      PT End of Session - 07/02/14 0851    Visit Number 5   Number of Visits 12   Date for PT Re-Evaluation 07/22/14   PT Start Time 0850   PT Stop Time 0930   PT Time Calculation (min) 40 min      Past Medical History  Diagnosis Date  . Hyperlipemia   . Hypertension   . Hypothyroidism   . Cholelithiasis   . Hepatic steatosis   . PONV (postoperative nausea and vomiting)   . Arthritis     Past Surgical History  Procedure Laterality Date  . Abdominal hysterectomy      @ age 70, due to fibroids   . Tonsillectomy    . Mohs surgery  2004    Basal Cell   . Colonoscopy w/ polypectomy  2010     Dr.Jacobs, due 2015   . Dilation and curettage of uterus    . Total knee arthroplasty  09/22/2011    Dr Nicholes Stairs, Left;    There were no vitals filed for this visit.  Visit Diagnosis:  Neck pain  Neck stiffness  Radicular pain in left arm      Subjective Assessment - 07/02/14 0851    Subjective states pain that she noted yesterday in L upper trap is 90% better today following yesterday's treatment however L UE was in great deal of pain last night requiring her to take pain meds at bedtime.  States feeling good this AM so far.   Currently in Pain? Yes   Pain Score 3    Pain Location Neck   Pain Orientation Left       TODAY'S TREATMENT TherEx - UBE lvl 1.0, 90" Manual - STM L c-spine paraspinals and UT with stretch to same, c-spine extension mobes to tolerance (grade 2-3), some B rotation and SB mobes but poorly tolerated (rapid onset of L UE pain) TherEx - Hooklying on Foam Roll t-spine extension stretch 2', pullover 0# 10x, B  Shoulder Horiz ABD 0# 10 then with Green TB 10x, Pullover 3# 10x (states foam roll exercises very beneficial) Mechanical Traction - c-spine, 20dg pull, 60"/20", 14#/7#, 8' (short treatment due to needing to leave early)               PT Short Term Goals - 06/16/14 1610    PT SHORT TERM GOAL #1   Title pt independent with HEP by 06/19/14   Status Achieved           PT Long Term Goals - 07/02/14 0936    PT LONG TERM GOAL #1   Title pt independent with advanced HEP as necessary by 07/22/14   Status On-going   PT LONG TERM GOAL #2   Title c-spine B Rotation and SB within 5 degree of symmetry without c/o pain or radicular symptoms by 07/22/14   Status On-going   PT LONG TERM GOAL #3   Title pt able to perform work duties, chores, and ADLS without limitation by neck pain, LOM, or radicular symptoms by 07/22/14.   Status On-going   PT LONG TERM GOAL #4   Title pt able to sleep without limitation by pain  or radicular symptoms by 07/22/14   Status On-going               Plan - 07/02/14 0933    Clinical Impression Statement pt had to leave early so did not re-assess fully today.  Good improvement noted with dry needling last treatmen and states "feels wonderful" with foam roller exercises today.  Still with highly irritable tissues/L UE nerves with c-spine mobes into rotation and side-bending however.  Overall, seems to be progressing well to date.   PT Next Visit Plan re-assess, manual and traction to address neck mobility and radicular symptoms, progress foam roll exercises and HEP as able   Consulted and Agree with Plan of Care Patient        Problem List Patient Active Problem List   Diagnosis Date Noted  . Arrhythmia 05/21/2014  . Nonspecific abnormal electrocardiogram (ECG) (EKG) 03/20/2014  . OSA (obstructive sleep apnea) 09/06/2012  . Allergic rhinoconjunctivitis, seasonal and perennial 07/02/2012  . History of colonic polyps 02/09/2010  . METABOLIC SYNDROME X  52/08/221  . GERD 09/11/2008  . CHOLELITHIASIS 09/11/2008  . FATTY LIVER DISEASE, HX OF 09/11/2008  . HYPOTHYROIDISM 09/11/2007  . Hyperlipidemia 09/11/2007  . HYPERTENSION 09/11/2007  . MEMORY LOSS 09/11/2007  . CARCINOMA, BASAL CELL, HX OF 05/09/2006    Wm Fruchter PT, OCS 07/02/2014, 9:36 AM  [facility] [place], [place], [address] Phone: [phone]   Fax:  [phone]

## 2014-07-02 NOTE — Progress Notes (Signed)
Sorry for [place] (the medical community and patients), but congratulations to you! Hope you enjoy every day of retirement, [place]

## 2014-07-06 ENCOUNTER — Telehealth: Payer: Self-pay | Admitting: *Deleted

## 2014-07-06 MED ORDER — PROPRANOLOL HCL 10 MG PO TABS: ORAL_TABLET | ORAL | Status: DC

## 2014-07-06 NOTE — Telephone Encounter (Signed)
-----   Message from Sanda Klein, MD sent at 07/02/2014  8:23 PM EDT -----   ----- Message -----    From: Hendricks Limes, MD    Sent: 07/01/2014   6:59 AM      To: Sanda Klein, MD  Please pursue this through your office; I am retiring 01/23/15. Thanks, Hopp ----- Message -----    From: Sanda Klein, MD    Sent: 06/30/2014   4:22 PM      To: Hendricks Limes, MD, Tressa Busman, CMA  Echo is mostly good news, but does show some heart muscle thickening, likely a consequence of HTN. Propranolol is a good choice both for HBP and palpitations and dose can be increased further if needed. Will be glad to coordinate this or if preferred, Dr. Linna Darner can  also take care of it.

## 2014-07-06 NOTE — Telephone Encounter (Signed)
Dr. Loletha Grayer will refill Propanolol.  Refills sent to [facility]. Patient voiced understanding.

## 2014-07-08 ENCOUNTER — Ambulatory Visit: Payer: BLUE CROSS/BLUE SHIELD | Admitting: Physical Therapy

## 2014-07-08 DIAGNOSIS — M542 Cervicalgia: Secondary | ICD-10-CM | POA: Diagnosis not present

## 2014-07-08 DIAGNOSIS — M792 Neuralgia and neuritis, unspecified: Secondary | ICD-10-CM

## 2014-07-08 DIAGNOSIS — M436 Torticollis: Secondary | ICD-10-CM

## 2014-07-08 NOTE — Therapy (Signed)
[facility] [address]  [place], [place], [address] Phone: [phone]   Fax:  [phone]  Physical Therapy Treatment  Patient Details  Name: [name] [name] MRN: [id number] Date of Birth: 1955/11/07 Referring Provider:  Earlie Server, MD  Encounter Date: 07/08/2014      PT End of Session - 07/08/14 0719    Visit Number 6   Number of Visits 12   Date for PT Re-Evaluation 07/22/14   PT Start Time 0718   PT Stop Time 0815   PT Time Calculation (min) 57 min      Past Medical History  Diagnosis Date  . Hyperlipemia   . Hypertension   . Hypothyroidism   . Cholelithiasis   . Hepatic steatosis   . PONV (postoperative nausea and vomiting)   . Arthritis     Past Surgical History  Procedure Laterality Date  . Abdominal hysterectomy      @ age 59, due to fibroids   . Tonsillectomy    . Mohs surgery  2004    Basal Cell   . Colonoscopy w/ polypectomy  2010     Dr.Jacobs, due 2015   . Dilation and curettage of uterus    . Total knee arthroplasty  09/22/2011    Dr Nicholes Stairs, Left;    There were no vitals filed for this visit.  Visit Diagnosis:  Neck pain  Neck stiffness  Radicular pain in left arm      Subjective Assessment - 07/08/14 0721    Subjective States feeling much better since last week - states is nearly completely pain-free. States "just a little bit in my shoulder" and noted N/T in L digits #2-3 upon waking this AM.   Currently in Pain? Yes   Pain Score 1    Pain Location Neck   Pain Orientation Left;Lower   Pain Radiating Towards L upper scapula       TODAY'S TREATMENT TherEx - UBE lvl 1.5, 90"/90" Manual - STM L c-spine paraspinals and UT with stretch to same, c-spine extension mobes to tolerance (grade 2-3), some B rotation and SB mobes but poorly tolerated (rapid onset of L UE pain) TherEx - Hooklying on Foam Roll t-spine extension stretch 2', Pullover 5# 15x2sets, B  Shoulder Horiz ABD Green TB 15x, B Shoulder ER Green TB 15x, B Horiz ABD 3# 15x Mechanical Traction - c-spine, 20dg pull, 60"/20", 15#/8#, 15'            PT Short Term Goals - 06/16/14 1610    PT SHORT TERM GOAL #1   Title pt independent with HEP by 06/19/14   Status Achieved           PT Long Term Goals - 07/08/14 0756    PT LONG TERM GOAL #1   Title pt independent with advanced HEP as necessary by 07/22/14   Status On-going   PT LONG TERM GOAL #2   Title c-spine B Rotation and SB within 5 degree of symmetry without c/o pain or radicular symptoms by 07/22/14   Status On-going   PT LONG TERM GOAL #3   Title pt able to perform work duties, chores, and ADLS without limitation by neck pain, LOM, or radicular symptoms by 07/22/14.   Status On-going   PT LONG TERM GOAL #4   Title pt able to sleep without limitation by pain or radicular symptoms by 07/22/14   Status On-going  Plan - 07/08/14 0757    Clinical Impression Statement pt progressing very well with regard to pain and c-spine symetric AROM but still restricted AROM and still able to provoke L UE radicular symptoms with L rotation or side-bending mobes/PROM.   PT Next Visit Plan manual and traction to address neck mobility and radicular symptoms, progress foam roll exercises and HEP as able; dry needling PRN   Consulted and Agree with Plan of Care Patient        Problem List Patient Active Problem List   Diagnosis Date Noted  . Arrhythmia 05/21/2014  . Nonspecific abnormal electrocardiogram (ECG) (EKG) 03/20/2014  . OSA (obstructive sleep apnea) 09/06/2012  . Allergic rhinoconjunctivitis, seasonal and perennial 07/02/2012  . History of colonic polyps 02/09/2010  . METABOLIC SYNDROME X 22/57/5051  . GERD 09/11/2008  . CHOLELITHIASIS 09/11/2008  . FATTY LIVER DISEASE, HX OF 09/11/2008  . HYPOTHYROIDISM 09/11/2007  . Hyperlipidemia 09/11/2007  . HYPERTENSION 09/11/2007  . MEMORY LOSS  09/11/2007  . CARCINOMA, BASAL CELL, HX OF 05/09/2006    Sarika Baldini PT, OCS 07/08/2014, 8:48 AM  [facility] [place], [place], [address] Phone: [phone]   Fax:  [phone]

## 2014-07-10 ENCOUNTER — Ambulatory Visit: Payer: BLUE CROSS/BLUE SHIELD | Admitting: Physical Therapy

## 2014-07-10 DIAGNOSIS — M542 Cervicalgia: Secondary | ICD-10-CM | POA: Diagnosis not present

## 2014-07-10 DIAGNOSIS — M792 Neuralgia and neuritis, unspecified: Secondary | ICD-10-CM

## 2014-07-10 DIAGNOSIS — M436 Torticollis: Secondary | ICD-10-CM

## 2014-07-10 NOTE — Therapy (Signed)
[facility] [address]  [place], [place], [address] Phone: [phone]   Fax:  [phone]  Physical Therapy Treatment  Patient Details  Name: [name] [name] MRN: [id number] Date of Birth: 1955/10/31 Referring Provider:  Earlie Server, MD  Encounter Date: 07/10/2014      PT End of Session - 07/10/14 0721    Visit Number 7   Number of Visits 12   Date for PT Re-Evaluation 07/22/14   PT Start Time 0718   PT Stop Time 0808   PT Time Calculation (min) 50 min      Past Medical History  Diagnosis Date  . Hyperlipemia   . Hypertension   . Hypothyroidism   . Cholelithiasis   . Hepatic steatosis   . PONV (postoperative nausea and vomiting)   . Arthritis     Past Surgical History  Procedure Laterality Date  . Abdominal hysterectomy      @ age 59, due to fibroids   . Tonsillectomy    . Mohs surgery  2004    Basal Cell   . Colonoscopy w/ polypectomy  2010     Dr.Jacobs, due 2015   . Dilation and curettage of uterus    . Total knee arthroplasty  09/22/2011    Dr Nicholes Stairs, Left;    There were no vitals filed for this visit.  Visit Diagnosis:  Radicular pain in left arm  Neck pain  Neck stiffness      Subjective Assessment - 07/10/14 0719    Subjective Reports intermittent mild shoulder pain since last treatment - currently no pain.  States continues to note intermittent N/T into L UE.   Currently in Pain? No/denies           TODAY'S TREATMENT TherEx - UBE lvl 1.5, 1'/1' Corner Chest Stretch 3x20"; Low Row 25# 2x12 Hooklying on Foam Roll t-spine extension stretch 2', Pullover 7# 2x15, B Shoulder Horiz ABD Blue TB 2x15, B Shoulder ER Blue TB 15x, UE D2 Blue TB 12x each Standing B Shoulder Extension with Green TB 15x; Low Row Black TB 15x Mechanical Traction - c-spine, 20dg pull, 60"/20", 16#/8#, 15'                        PT Short Term Goals - 06/16/14 1610    PT  SHORT TERM GOAL #1   Title pt independent with HEP by 06/19/14   Status Achieved           PT Long Term Goals - 07/10/14 0753    PT LONG TERM GOAL #1   Title pt independent with advanced HEP as necessary by 07/22/14   Status On-going   PT LONG TERM GOAL #2   Title c-spine B Rotation and SB within 5 degree of symmetry without c/o pain or radicular symptoms by 07/22/14   Status On-going   PT LONG TERM GOAL #3   Title pt able to perform work duties, chores, and ADLS without limitation by neck pain, LOM, or radicular symptoms by 07/22/14.   Status On-going   PT LONG TERM GOAL #4   Title pt able to sleep without limitation by pain or radicular symptoms by 07/22/14  only notes difficulty if attempts to sleep prone (this had been her preferred sleeping posture in the past)   Status Partially Met               Plan - 07/10/14 0938  Clinical Impression Statement no manual today; all exercises performed without c/o symptom provocation.  Pain-free today.   PT Next Visit Plan manual and traction to address neck mobility and radicular symptoms, progress exercises and HEP as able; dry needling PRN   Consulted and Agree with Plan of Care Patient        Problem List Patient Active Problem List   Diagnosis Date Noted  . Arrhythmia 05/21/2014  . Nonspecific abnormal electrocardiogram (ECG) (EKG) 03/20/2014  . OSA (obstructive sleep apnea) 09/06/2012  . Allergic rhinoconjunctivitis, seasonal and perennial 07/02/2012  . History of colonic polyps 02/09/2010  . METABOLIC SYNDROME X 01/41/0301  . GERD 09/11/2008  . CHOLELITHIASIS 09/11/2008  . FATTY LIVER DISEASE, HX OF 09/11/2008  . HYPOTHYROIDISM 09/11/2007  . Hyperlipidemia 09/11/2007  . HYPERTENSION 09/11/2007  . MEMORY LOSS 09/11/2007  . CARCINOMA, BASAL CELL, HX OF 05/09/2006    Howard Bunte PT, OCS 07/10/2014, 8:21 AM  [facility] [address]  [place], [place], [address] Phone: [phone]   Fax:  [phone]

## 2014-07-15 ENCOUNTER — Ambulatory Visit: Payer: BLUE CROSS/BLUE SHIELD | Admitting: Physical Therapy

## 2014-07-15 DIAGNOSIS — M436 Torticollis: Secondary | ICD-10-CM

## 2014-07-15 DIAGNOSIS — M542 Cervicalgia: Secondary | ICD-10-CM

## 2014-07-15 DIAGNOSIS — M792 Neuralgia and neuritis, unspecified: Secondary | ICD-10-CM

## 2014-07-15 NOTE — Therapy (Signed)
[facility] [address]  [place], [place], [address] Phone: [phone]   Fax:  [phone]  Physical Therapy Treatment  Patient Details  Name: [name] [name] MRN: [id number] Date of Birth: 1955-12-07 Referring Provider:  Earlie Server, MD  Encounter Date: 07/15/2014      PT End of Session - 07/15/14 0812    Visit Number 8   Number of Visits 12   Date for PT Re-Evaluation 07/22/14   PT Start Time 0807   PT Stop Time 0855   PT Time Calculation (min) 48 min      Past Medical History  Diagnosis Date  . Hyperlipemia   . Hypertension   . Hypothyroidism   . Cholelithiasis   . Hepatic steatosis   . PONV (postoperative nausea and vomiting)   . Arthritis     Past Surgical History  Procedure Laterality Date  . Abdominal hysterectomy      @ age 77, due to fibroids   . Tonsillectomy    . Mohs surgery  2004    Basal Cell   . Colonoscopy w/ polypectomy  2010     Dr.Jacobs, due 2015   . Dilation and curettage of uterus    . Total knee arthroplasty  09/22/2011    Dr Nicholes Stairs, Left;    There were no vitals filed for this visit.  Visit Diagnosis:  Radicular pain in left arm  Neck pain  Neck stiffness      Subjective Assessment - 07/15/14 0810    Subjective states continues to note improvement; states "almost able to sleep prone" but still notes N/T in L UE with this.   Currently in Pain? Yes   Pain Score 1    Pain Location Neck   Pain Orientation Left;Lower             TODAY'S TREATMENT TherEx - UBE lvl 2.0 90"/90" Corner Chest Stretch 3x20"; Low Row 25# 15x, 35# 12x TRX Low Row 12x TRX High Row 12x Standing B Shoulder ABD 2# 15x Hooklying on Foam Roll T-Spine extension stretch 2', Pullover 8# 2x15, B Shoulder Horiz ABD Blue TB 20x, B Shoulder ER Blue TB 20x, UE D2 Blue TB 15x each  Mechanical Traction - c-spine, 20dg pull, 60"/20", 16#/8#, 15'                       PT Short Term Goals - 06/16/14 1610    PT SHORT TERM GOAL #1   Title pt independent with HEP by 06/19/14   Status Achieved           PT Long Term Goals - 07/10/14 0753    PT LONG TERM GOAL #1   Title pt independent with advanced HEP as necessary by 07/22/14   Status On-going   PT LONG TERM GOAL #2   Title c-spine B Rotation and SB within 5 degree of symmetry without c/o pain or radicular symptoms by 07/22/14   Status On-going   PT LONG TERM GOAL #3   Title pt able to perform work duties, chores, and ADLS without limitation by neck pain, LOM, or radicular symptoms by 07/22/14.   Status On-going   PT LONG TERM GOAL #4   Title pt able to sleep without limitation by pain or radicular symptoms by 07/22/14  only notes difficulty if attempts to sleep prone (this had been her preferred sleeping posture in the past)   Status Partially Met  Plan - 07/15/14 0828    Clinical Impression Statement good subjective progress with pt reporting minimal to no pain throughout the day but still with L UE radicular symptoms with prone lying (likely due to c-spine rotation) but she states there has been some improvment in this lately.   PT Next Visit Plan continue therex progressions and mechanical traction; manual and dry needling PRN   Consulted and Agree with Plan of Care Patient        Problem List Patient Active Problem List   Diagnosis Date Noted  . Arrhythmia 05/21/2014  . Nonspecific abnormal electrocardiogram (ECG) (EKG) 03/20/2014  . OSA (obstructive sleep apnea) 09/06/2012  . Allergic rhinoconjunctivitis, seasonal and perennial 07/02/2012  . History of colonic polyps 02/09/2010  . METABOLIC SYNDROME X 54/56/2563  . GERD 09/11/2008  . CHOLELITHIASIS 09/11/2008  . FATTY LIVER DISEASE, HX OF 09/11/2008  . HYPOTHYROIDISM 09/11/2007  . Hyperlipidemia 09/11/2007  . HYPERTENSION 09/11/2007  . MEMORY LOSS 09/11/2007  . CARCINOMA, BASAL CELL, HX OF 05/09/2006     [name] [name] PT, OCS 07/15/2014, 8:50 AM  [facility] [place], [place], [address] Phone: [phone]   Fax:  [phone]

## 2014-07-17 ENCOUNTER — Ambulatory Visit: Payer: BLUE CROSS/BLUE SHIELD | Admitting: Physical Therapy

## 2014-07-17 DIAGNOSIS — M542 Cervicalgia: Secondary | ICD-10-CM

## 2014-07-17 DIAGNOSIS — M436 Torticollis: Secondary | ICD-10-CM

## 2014-07-17 DIAGNOSIS — M792 Neuralgia and neuritis, unspecified: Secondary | ICD-10-CM

## 2014-07-17 NOTE — Therapy (Signed)
[facility] [address]  [place], [place], [address] Phone: [phone]   Fax:  [phone]  Physical Therapy Treatment  Patient Details  Name: [name] [name] MRN: [id number] Date of Birth: [birth year] Referring Provider:  Earlie Server, MD  Encounter Date: 07/17/2014      PT End of Session - 07/17/14 1023    Visit Number 9   Number of Visits 12   Date for PT Re-Evaluation 07/22/14   PT Start Time 1020   PT Stop Time 1118   PT Time Calculation (min) 58 min      Past Medical History  Diagnosis Date  . Hyperlipemia   . Hypertension   . Hypothyroidism   . Cholelithiasis   . Hepatic steatosis   . PONV (postoperative nausea and vomiting)   . Arthritis     Past Surgical History  Procedure Laterality Date  . Abdominal hysterectomy      @ age 19, due to fibroids   . Tonsillectomy    . Mohs surgery  2004    Basal Cell   . Colonoscopy w/ polypectomy  2010     [name] [name], due 2015   . Dilation and curettage of uterus    . Total knee arthroplasty  09/22/2011    Dr [name] [name], Left;    There were no vitals filed for this visit.  Visit Diagnosis:  Radicular pain in left arm  Neck pain  Neck stiffness      Subjective Assessment - 07/17/14 1021    Subjective States did not take meds yesterday due to feeling good and noted some soreness last night and into this morning.  States slept well without limitation by pain.  Pain up to 2/10 this AM and took meds this AM.  States is still very pleased with progress.  No c/o UE symptoms currently.  States notes N/T into L UE with prone lying but states no longer painful like it was.   Currently in Pain? Yes   Pain Score 2    Pain Location Neck   Pain Orientation Left;Lower           TODAY'S TREATMENT Manual - c-spine B Rotation, SB, extension, and retraction mobes to tolerance.  Much improved tolerance with L rotation but still difficult/pain in L  shoulder with L side-bending and at times with extension Wall Slide with B shoulder flexion 10x (slight pull in L upper shoulder/trapezius but much better than when she attempts free squats at home) Low Row 35# 2x15 Corner Stretch 2x20" POE c-spine B Rotation AROM 10x each (felt good during activity but then noted increased L shoulder/neck soreness.  Added IFC to traction due to pain) Mechanical Traction - c-spine, 20dg pull, 60"/20", 16#/8#, 42' with IFC (80-'150Hz' ) x 20' to L upper scapular region and c-spine.               PT Short Term Goals - 06/16/14 1610    PT SHORT TERM GOAL #1   Title pt independent with HEP by 06/19/14   Status Achieved           PT Long Term Goals - 07/10/14 0753    PT LONG TERM GOAL #1   Title pt independent with advanced HEP as necessary by 07/22/14   Status On-going   PT LONG TERM GOAL #2   Title c-spine B Rotation and SB within 5 degree of symmetry without c/o pain or radicular symptoms by 07/22/14   Status  On-going   PT LONG TERM GOAL #3   Title pt able to perform work duties, chores, and ADLS without limitation by neck pain, LOM, or radicular symptoms by 07/22/14.   Status On-going   PT LONG TERM GOAL #4   Title pt able to sleep without limitation by pain or radicular symptoms by 07/22/14  only notes difficulty if attempts to sleep prone (this had been her preferred sleeping posture in the past)   Status Partially Met               Plan - 07/17/14 1101    Clinical Impression Statement pt seems to be progressing but missing a dose of meds yesterday seems to have allowed increased soreness/pain today. She displays improved tolerance with mobes compared to start of POC but still with c/o L UE radicular pain with closing L facets.   PT Next Visit Plan continue therex progressions and mechanical traction; manual and dry needling PRN   Consulted and Agree with Plan of Care Patient        Problem List Patient Active Problem List    Diagnosis Date Noted  . Arrhythmia 05/21/2014  . Nonspecific abnormal electrocardiogram (ECG) (EKG) 03/20/2014  . OSA (obstructive sleep apnea) 09/06/2012  . Allergic rhinoconjunctivitis, seasonal and perennial 07/02/2012  . History of colonic polyps 02/09/2010  . METABOLIC SYNDROME X 43/15/4008  . GERD 09/11/2008  . CHOLELITHIASIS 09/11/2008  . FATTY LIVER DISEASE, HX OF 09/11/2008  . HYPOTHYROIDISM 09/11/2007  . Hyperlipidemia 09/11/2007  . HYPERTENSION 09/11/2007  . MEMORY LOSS 09/11/2007  . CARCINOMA, BASAL CELL, HX OF 05/09/2006    [name] PT, OCS 07/17/2014, 11:18 AM  [facility] [place], [place], [address] Phone: [phone]   Fax:  [phone]

## 2014-07-20 ENCOUNTER — Ambulatory Visit: Payer: BLUE CROSS/BLUE SHIELD | Admitting: Rehabilitation

## 2014-07-20 DIAGNOSIS — M792 Neuralgia and neuritis, unspecified: Secondary | ICD-10-CM

## 2014-07-20 DIAGNOSIS — M436 Torticollis: Secondary | ICD-10-CM

## 2014-07-20 DIAGNOSIS — M542 Cervicalgia: Secondary | ICD-10-CM | POA: Diagnosis not present

## 2014-07-20 NOTE — Therapy (Signed)
[facility] [address]  [place], [place], [address] Phone: [phone]   Fax:  [phone]  Physical Therapy Treatment  Patient Details  Name: [name] [name] MRN: [id number] Date of Birth: [birth year] Referring Provider:  Earlie Server, MD  Encounter Date: 07/20/2014      PT End of Session - 07/20/14 1448    Visit Number 10   Number of Visits 12   Date for PT Re-Evaluation 07/22/14   PT Start Time 6962   PT Stop Time 9528   PT Time Calculation (min) 56 min      Past Medical History  Diagnosis Date  . Hyperlipemia   . Hypertension   . Hypothyroidism   . Cholelithiasis   . Hepatic steatosis   . PONV (postoperative nausea and vomiting)   . Arthritis     Past Surgical History  Procedure Laterality Date  . Abdominal hysterectomy      @ age 31, due to fibroids   . Tonsillectomy    . Mohs surgery  2004    Basal Cell   . Colonoscopy w/ polypectomy  2010     Dr.Jacobs, due 2015   . Dilation and curettage of uterus    . Total knee arthroplasty  09/22/2011    Dr Nicholes Stairs, Left;    There were no vitals filed for this visit.  Visit Diagnosis:  Radicular pain in left arm  Neck pain  Neck stiffness      Subjective Assessment - 07/20/14 1450    Subjective Reports no pain today, feels like she is on the road to recovery. Notes some discomfort when she moves a certain way.   Currently in Pain? No/denies            [facility] PT Assessment - 07/20/14 1449    Observation/Other Assessments   Focus on Therapeutic Outcomes (FOTO)  51% limitation       TODAY'S TREATMENT TherEx - UBE lvl 3.0 90"/90" Corner Chest Stretch 3x20" Low Row 25# 15x, 35# 12x  Hooklying on Foam Roll T-Spine extension stretch 2', Pullover 8# 2x15, Bil Shoulder Horiz ABD Blue TB 20x, Bil Shoulder ER Blue TB 20x TRX Low Row 15x  TRX High Row 15x Wall slides with Bil shoulder Flexion 10x  Mechanical Traction - c-spine, 20dg  pull, 60"/20", 16#/8#, 15'        PT Short Term Goals - 06/16/14 1610    PT SHORT TERM GOAL #1   Title pt independent with HEP by 06/19/14   Status Achieved           PT Long Term Goals - 07/10/14 0753    PT LONG TERM GOAL #1   Title pt independent with advanced HEP as necessary by 07/22/14   Status On-going   PT LONG TERM GOAL #2   Title c-spine B Rotation and SB within 5 degree of symmetry without c/o pain or radicular symptoms by 07/22/14   Status On-going   PT LONG TERM GOAL #3   Title pt able to perform work duties, chores, and ADLS without limitation by neck pain, LOM, or radicular symptoms by 07/22/14.   Status On-going   PT LONG TERM GOAL #4   Title pt able to sleep without limitation by pain or radicular symptoms by 07/22/14  only notes difficulty if attempts to sleep prone (this had been her preferred sleeping posture in the past)   Status Partially Met  Plan - 07/20/14 1525    Clinical Impression Statement Good tolerance to exercises with no complaint or pain or UE symptoms. Did not perform prone exercises but will in the future since pt stated she would like to be able to lay on her stomach again.    PT Next Visit Plan continue therex progressions and mechanical traction; manual and dry needling PRN   Consulted and Agree with Plan of Care Patient        Problem List Patient Active Problem List   Diagnosis Date Noted  . Arrhythmia 05/21/2014  . Nonspecific abnormal electrocardiogram (ECG) (EKG) 03/20/2014  . OSA (obstructive sleep apnea) 09/06/2012  . Allergic rhinoconjunctivitis, seasonal and perennial 07/02/2012  . History of colonic polyps 02/09/2010  . METABOLIC SYNDROME X 69/62/9528  . GERD 09/11/2008  . CHOLELITHIASIS 09/11/2008  . FATTY LIVER DISEASE, HX OF 09/11/2008  . HYPOTHYROIDISM 09/11/2007  . Hyperlipidemia 09/11/2007  . HYPERTENSION 09/11/2007  . MEMORY LOSS 09/11/2007  . CARCINOMA, BASAL CELL, HX OF 05/09/2006     Otelia Limes Bovey, PTA 07/20/2014, 3:26 PM  [facility] [address]  [place], [place], [address] Phone: [phone]   Fax:  [phone]

## 2014-07-22 ENCOUNTER — Ambulatory Visit: Payer: BLUE CROSS/BLUE SHIELD | Admitting: Physical Therapy

## 2014-07-22 DIAGNOSIS — M542 Cervicalgia: Secondary | ICD-10-CM | POA: Diagnosis not present

## 2014-07-22 DIAGNOSIS — M436 Torticollis: Secondary | ICD-10-CM

## 2014-07-22 DIAGNOSIS — M792 Neuralgia and neuritis, unspecified: Secondary | ICD-10-CM

## 2014-07-22 NOTE — Therapy (Signed)
[facility] [address]  [place], [place], [address] Phone: [phone]   Fax:  [phone]  Physical Therapy Treatment  Patient Details  Name: [name] [name] MRN: [id number] Date of Birth: [birth year] Referring Provider:  Earlie Server, MD  Encounter Date: 07/22/2014      PT End of Session - 07/22/14 0814    Visit Number 11   Number of Visits 12   Date for PT Re-Evaluation 07/22/14   PT Start Time 0808   PT Stop Time 0910   PT Time Calculation (min) 62 min      Past Medical History  Diagnosis Date  . Hyperlipemia   . Hypertension   . Hypothyroidism   . Cholelithiasis   . Hepatic steatosis   . PONV (postoperative nausea and vomiting)   . Arthritis     Past Surgical History  Procedure Laterality Date  . Abdominal hysterectomy      @ age 12, due to fibroids   . Tonsillectomy    . Mohs surgery  2004    Basal Cell   . Colonoscopy w/ polypectomy  2010     Dr.Jacobs, due 2015   . Dilation and curettage of uterus    . Total knee arthroplasty  09/22/2011    Dr Nicholes Stairs, Left;    There were no vitals filed for this visit.  Visit Diagnosis:  Radicular pain in left arm  Neck pain  Neck stiffness      Subjective Assessment - 07/22/14 0809    Subjective notes some L upper scapular pain this AM   Currently in Pain? Yes   Pain Score 2    Pain Location Neck   Pain Orientation Left;Lower   Pain Radiating Towards L upper scapula             TODAY'S TREATMENT Manual - STMa and TPR L UT and levator, c-spine distraction with grade 2 mobes into L side-bending, L rotation, and extension and grade 3 into R rotation and side-bending.  Multiple attempts to increase to grade 3 into L side-bending, rotation, and extension all would result in immediate onset of L UE pain.  Only able to get just past neutral into each of these direction during manual therapy today. No exercise due to worsened  symptoms and higher level of irritability  IFC (80-'150Hz' ) x 20' to L neck and upper scapular muscles during Mechanical Traction to c-spine, 20dg pull, 60"/20", 18#/9#, 18'                       PT Short Term Goals - 06/16/14 1610    PT SHORT TERM GOAL #1   Title pt independent with HEP by 06/19/14   Status Achieved           PT Long Term Goals - 07/10/14 0753    PT LONG TERM GOAL #1   Title pt independent with advanced HEP as necessary by 07/22/14   Status On-going   PT LONG TERM GOAL #2   Title c-spine B Rotation and SB within 5 degree of symmetry without c/o pain or radicular symptoms by 07/22/14   Status On-going   PT LONG TERM GOAL #3   Title pt able to perform work duties, chores, and ADLS without limitation by neck pain, LOM, or radicular symptoms by 07/22/14.   Status On-going   PT LONG TERM GOAL #4   Title pt able to sleep without limitation by pain  or radicular symptoms by 07/22/14  only notes difficulty if attempts to sleep prone (this had been her preferred sleeping posture in the past)   Status Partially Met               Plan - 07/22/14 0855    Clinical Impression Statement pt with increased pain, worsened tissue irritability, and worsened neck mobility today with rapid onset of L UE radicalar pain with closing of L facets.  No exerise today due to pain increase.   PT Next Visit Plan light therex to tolerance for scapular and c-spine stability; c-spine mechanical traction with IFC PRN; manual and dry needling PRN.  Pt to bee seen by PTA next treatment then will be back on my schedule. I will take care of renewal and re-assessment at that time.  Not performed today due to worsened symptoms today.   Consulted and Agree with Plan of Care Patient        Problem List Patient Active Problem List   Diagnosis Date Noted  . Arrhythmia 05/21/2014  . Nonspecific abnormal electrocardiogram (ECG) (EKG) 03/20/2014  . OSA (obstructive sleep apnea)  09/06/2012  . Allergic rhinoconjunctivitis, seasonal and perennial 07/02/2012  . History of colonic polyps 02/09/2010  . METABOLIC SYNDROME X 83/72/9021  . GERD 09/11/2008  . CHOLELITHIASIS 09/11/2008  . FATTY LIVER DISEASE, HX OF 09/11/2008  . HYPOTHYROIDISM 09/11/2007  . Hyperlipidemia 09/11/2007  . HYPERTENSION 09/11/2007  . MEMORY LOSS 09/11/2007  . CARCINOMA, BASAL CELL, HX OF 05/09/2006    Harlee Pursifull PT, OCS 07/22/2014, 9:13 AM  [facility] [place], [place], [address] Phone: [phone]   Fax:  [phone]

## 2014-07-29 ENCOUNTER — Ambulatory Visit: Payer: BLUE CROSS/BLUE SHIELD | Attending: Orthopedic Surgery | Admitting: Rehabilitation

## 2014-07-29 DIAGNOSIS — M436 Torticollis: Secondary | ICD-10-CM

## 2014-07-29 DIAGNOSIS — M542 Cervicalgia: Secondary | ICD-10-CM

## 2014-07-29 DIAGNOSIS — M792 Neuralgia and neuritis, unspecified: Secondary | ICD-10-CM

## 2014-07-29 NOTE — Therapy (Signed)
[facility] [address]  [place], [place], [address] Phone: [phone]   Fax:  [phone]  Physical Therapy Treatment  Patient Details  Name: [name] [name] MRN: [id number] Date of Birth: 1955-06-23 Referring Provider:  Earlie Server, MD  Encounter Date: 07/29/2014      PT End of Session - 07/29/14 0759    Visit Number 12   Number of Visits 12   Date for PT Re-Evaluation 07/22/14   PT Start Time 0756   PT Stop Time 0846   PT Time Calculation (min) 50 min      Past Medical History  Diagnosis Date  . Hyperlipemia   . Hypertension   . Hypothyroidism   . Cholelithiasis   . Hepatic steatosis   . PONV (postoperative nausea and vomiting)   . Arthritis     Past Surgical History  Procedure Laterality Date  . Abdominal hysterectomy      @ age 59, due to fibroids   . Tonsillectomy    . Mohs surgery  2004    Basal Cell   . Colonoscopy w/ polypectomy  2010     Dr.Jacobs, due 2015   . Dilation and curettage of uterus    . Total knee arthroplasty  09/22/2011    Dr Nicholes Stairs, Left;    There were no vitals filed for this visit.  Visit Diagnosis:  Radicular pain in left arm  Neck pain  Neck stiffness      Subjective Assessment - 07/29/14 0758    Subjective Reports she got one of the rolls for home and that has made all the difference. Needs to leave by 9:00.    Currently in Pain? No/denies      TODAY'S TREATMENT TherEx - UBE lvl 3.0 90"/90" Corner Chest Stretch 3x20" Hooklying on Foam Roll T-Spine extension stretch 2', Pullover 8# 2x15, Bil Shoulder Horiz ABD Blue TB 20x, Bil Shoulder ER Blue TB 20x, D2 Flexion Blue TB 15x  Wall slides with Bil shoulder Flexion 10x  Manual-STM and TPR to Bil UT, levator. Gentle STM to suboccipitals with SOR. Gentle PROM into rotation and side-bending.   Mechanical Traction to c-spine, 20dg pull, 60"/20", 18#/9#, 18'        PT Short Term Goals - 06/16/14  1610    PT SHORT TERM GOAL #1   Title pt independent with HEP by 06/19/14   Status Achieved           PT Long Term Goals - 07/10/14 0753    PT LONG TERM GOAL #1   Title pt independent with advanced HEP as necessary by 07/22/14   Status On-going   PT LONG TERM GOAL #2   Title c-spine B Rotation and SB within 5 degree of symmetry without c/o pain or radicular symptoms by 07/22/14   Status On-going   PT LONG TERM GOAL #3   Title pt able to perform work duties, chores, and ADLS without limitation by neck pain, LOM, or radicular symptoms by 07/22/14.   Status On-going   PT LONG TERM GOAL #4   Title pt able to sleep without limitation by pain or radicular symptoms by 07/22/14  only notes difficulty if attempts to sleep prone (this had been her preferred sleeping posture in the past)   Status Partially Met               Plan - 07/29/14 0834    Clinical Impression Statement Performed a mixture of manual  and therex today to try and keep pain levels down. Pt reports using a foam roll at home which has been helping with pain.    PT Next Visit Plan light therex to tolerance for scapular and c-spine stability; c-spine mechanical traction with IFC PRN; manual and dry needling PRN. Renewal and mobs when seen by PT.   Consulted and Agree with Plan of Care Patient        Problem List Patient Active Problem List   Diagnosis Date Noted  . Arrhythmia 05/21/2014  . Nonspecific abnormal electrocardiogram (ECG) (EKG) 03/20/2014  . OSA (obstructive sleep apnea) 09/06/2012  . Allergic rhinoconjunctivitis, seasonal and perennial 07/02/2012  . History of colonic polyps 02/09/2010  . METABOLIC SYNDROME X 95/18/8416  . GERD 09/11/2008  . CHOLELITHIASIS 09/11/2008  . FATTY LIVER DISEASE, HX OF 09/11/2008  . HYPOTHYROIDISM 09/11/2007  . Hyperlipidemia 09/11/2007  . HYPERTENSION 09/11/2007  . MEMORY LOSS 09/11/2007  . CARCINOMA, BASAL CELL, HX OF 05/09/2006    Barbette Hair, PTA 07/29/2014,  8:35 AM  [facility] [place], [place], [address] Phone: [phone]   Fax:  [phone]

## 2014-07-31 ENCOUNTER — Ambulatory Visit: Payer: BLUE CROSS/BLUE SHIELD | Admitting: Physical Therapy

## 2014-07-31 DIAGNOSIS — M436 Torticollis: Secondary | ICD-10-CM

## 2014-07-31 DIAGNOSIS — M792 Neuralgia and neuritis, unspecified: Secondary | ICD-10-CM

## 2014-07-31 DIAGNOSIS — M542 Cervicalgia: Secondary | ICD-10-CM

## 2014-07-31 NOTE — Therapy (Signed)
[facility] [address]  [place], [place], [address] Phone: [phone]   Fax:  [phone]  Physical Therapy Treatment  Patient Details  Name: [name] [name] MRN: [id number] Date of Birth: 1955/02/01 Referring Provider:  Earlie Server, MD  Encounter Date: 07/31/2014      PT End of Session - 07/31/14 0751    Visit Number 13   Number of Visits 20   Date for PT Re-Evaluation 08/28/14   PT Start Time 3704   PT Stop Time 0800   PT Time Calculation (min) 43 min      Past Medical History  Diagnosis Date  . Hyperlipemia   . Hypertension   . Hypothyroidism   . Cholelithiasis   . Hepatic steatosis   . PONV (postoperative nausea and vomiting)   . Arthritis     Past Surgical History  Procedure Laterality Date  . Abdominal hysterectomy      @ age 69, due to fibroids   . Tonsillectomy    . Mohs surgery  2004    Basal Cell   . Colonoscopy w/ polypectomy  2010     Dr.Jacobs, due 2015   . Dilation and curettage of uterus    . Total knee arthroplasty  09/22/2011    Dr Nicholes Stairs, Left;    There were no vitals filed for this visit.  Visit Diagnosis:  Neck stiffness  Neck pain  Radicular pain in left arm      Subjective Assessment - 07/31/14 0721    Subjective States has been feeling very good lately since she purchased foam roll for HEP.  Still notes intermittent N/T into L UE but states is able to lie prone a little now prior to symptom onset.   Currently in Pain? No/denies            [facility] PT Assessment - 07/31/14 0001    Assessment   Medical Diagnosis neck pain   Onset Date/Surgical Date 01/24/12   Next MD Visit 09/07/14   AROM   AROM Assessment Site Cervical   Cervical Extension 40  no pain   Cervical - Right Side Bend 26  no pain   Cervical - Left Side Bend 22  no pain   Cervical - Right Rotation 52  no pain   Cervical - Left Rotation 48  no pain   Special Tests    Special Tests  --  continued POS L c-spine quadrant testing for L UE radicular          TODAY'S TREATMENT c-spine ROM and special testing TherEx - Hooklying on Foam Roll: B C-spine Rotation AROM 10x, C-spine flex/ext AROM 10x, Pullover 8# 15x with c-spine flex/ext AROM 15x Mechanical Traction - c-spine, 20dg pull, 18#/9#, 60"/20", 12'         PT Education - 07/31/14 0801    Education provided Yes   Education Details HEP update - c-spine AROM on foam roll   Person(s) Educated Patient   Methods Explanation;Demonstration   Comprehension Verbalized understanding;Returned demonstration          PT Short Term Goals - 06/16/14 1610    PT SHORT TERM GOAL #1   Title pt independent with HEP by 06/19/14   Status Achieved           PT Long Term Goals - 07/31/14 0759    PT LONG TERM GOAL #1   Title pt independent with advanced HEP as necessary by 08/28/14  Status Revised   PT LONG TERM GOAL #2   Title c-spine B Rotation and SB within 5 degree of symmetry and improved to 60 dg rotation and 25 side-bending without c/o pain or radicular symptoms by 08/28/14   Status Revised   PT LONG TERM GOAL #3   Title pt able to perform work duties, chores, and ADLS without limitation by neck pain, LOM, or radicular symptoms by 08/28/14   Status On-going   PT LONG TERM GOAL #4   Title pt able to sleep without limitation by pain or radicular symptoms   Status Achieved               Plan - 07/31/14 0753    Clinical Impression Statement pt with good improvement with regard to level of function and symptom onset.  She reports being pain-free majority of time and states L UE N/T is much less frequent; however it is still presnet.  She states she is able to lie prone for several minutes before symptom onset (initially unable to lie supine at all).  Whereas symptom onset and level of tissue irritability has improved, her c-spine AROM is still quite limited (B rotation is between 45-52 degrees).  We have not  been able to work aggressively on this until recently due to rapid onset of L UE radicular symptoms.  Pt wants to improve ROM and is hopeful that L UE N/T can be completely abolished and therefore has requested to continue PT.  I am recommending continued PT for up to 4 weeks as long as she continues to demonstrate progress with treatments.   Pt will benefit from skilled therapeutic intervention in order to improve on the following deficits Postural dysfunction;Decreased range of motion;Pain;Improper body mechanics   PT Frequency 2x / week   PT Duration 4 weeks   PT Treatment/Interventions Manual techniques;Traction;Therapeutic exercise;Patient/family education;ADLs/Self Care Home Management;Electrical Stimulation;Moist Heat;Cryotherapy;Ultrasound;Therapeutic activities;Dry needling   PT Next Visit Plan Therex to tolerance for scapular and c-spine stability as well as c-spine AROM; c-spine mechanical traction with IFC PRN; manual and dry needling PRN; manual therapy for ROM PRN   Consulted and Agree with Plan of Care Patient        Problem List Patient Active Problem List   Diagnosis Date Noted  . Arrhythmia 05/21/2014  . Nonspecific abnormal electrocardiogram (ECG) (EKG) 03/20/2014  . OSA (obstructive sleep apnea) 09/06/2012  . Allergic rhinoconjunctivitis, seasonal and perennial 07/02/2012  . History of colonic polyps 02/09/2010  . METABOLIC SYNDROME X 64/40/3474  . GERD 09/11/2008  . CHOLELITHIASIS 09/11/2008  . FATTY LIVER DISEASE, HX OF 09/11/2008  . HYPOTHYROIDISM 09/11/2007  . Hyperlipidemia 09/11/2007  . HYPERTENSION 09/11/2007  . MEMORY LOSS 09/11/2007  . CARCINOMA, BASAL CELL, HX OF 05/09/2006    Ladarrious Kirksey PT, OCS 07/31/2014, 8:06 AM  [facility] [place], [place], [address] Phone: [phone]   Fax:  [phone]

## 2014-08-03 ENCOUNTER — Ambulatory Visit: Payer: BLUE CROSS/BLUE SHIELD | Admitting: Rehabilitation

## 2014-08-03 DIAGNOSIS — M436 Torticollis: Secondary | ICD-10-CM

## 2014-08-03 DIAGNOSIS — M542 Cervicalgia: Secondary | ICD-10-CM

## 2014-08-03 DIAGNOSIS — M792 Neuralgia and neuritis, unspecified: Secondary | ICD-10-CM

## 2014-08-03 NOTE — Therapy (Signed)
[facility] [place] [address]  [place], [place], [address] Phone: [phone]   Fax:  [phone]  Physical Therapy Treatment  Patient Details  Name: [name] [name] MRN: [id number] Date of Birth: 09-Nov-1955 Referring Provider:  Earlie Server, MD  Encounter Date: 08/03/2014      PT End of Session - 08/03/14 0802    Visit Number 14   Number of Visits 20   Date for PT Re-Evaluation 08/28/14   PT Start Time 0800   PT Stop Time 0826   PT Time Calculation (min) 26 min      Past Medical History  Diagnosis Date  . Hyperlipemia   . Hypertension   . Hypothyroidism   . Cholelithiasis   . Hepatic steatosis   . PONV (postoperative nausea and vomiting)   . Arthritis     Past Surgical History  Procedure Laterality Date  . Abdominal hysterectomy      @ age 65, due to fibroids   . Tonsillectomy    . Mohs surgery  2004    Basal Cell   . Colonoscopy w/ polypectomy  2010     Dr.Jacobs, due 2015   . Dilation and curettage of uterus    . Total knee arthroplasty  09/22/2011    Dr Nicholes Stairs, Left;    There were no vitals filed for this visit.  Visit Diagnosis:  Neck stiffness  Neck pain  Radicular pain in left arm      Subjective Assessment - 08/03/14 0805    Subjective Reports feeling fine after last time. Notes difficulty with chin tucks due to pain/Lt UE N/T   Currently in Pain? No/denies          TODAY'S TREATMENT TherEx - UBE level 3.0 90"/90" Hooklying on Foam Roll: Pec/Ext stretch x2', B C-spine Rotation AROM 10x, C-spine flex/ext AROM 10x, Pullover 8# 15x with c-spine flex/ext AROM 15x, D2 Flexion Blue TB 15x, Bil Shoulder Horiz Blue TB 20x, Bil Shoulder ER Blue TB 20x  Low Row 25# 15x, 35# 15x TRX Low Row 15x Wall slides with Bil Shoulder Flexion 10x       PT Short Term Goals - 06/16/14 1610    PT SHORT TERM GOAL #1   Title pt independent with HEP by 06/19/14   Status Achieved            PT Long Term Goals - 07/31/14 0759    PT LONG TERM GOAL #1   Title pt independent with advanced HEP as necessary by 08/28/14   Status Revised   PT LONG TERM GOAL #2   Title c-spine B Rotation and SB within 5 degree of symmetry and improved to 60 dg rotation and 25 side-bending without c/o pain or radicular symptoms by 08/28/14   Status Revised   PT LONG TERM GOAL #3   Title pt able to perform work duties, chores, and ADLS without limitation by neck pain, LOM, or radicular symptoms by 08/28/14   Status On-going   PT LONG TERM GOAL #4   Title pt able to sleep without limitation by pain or radicular symptoms   Status Achieved               Plan - 08/03/14 0824    Clinical Impression Statement Good tolerance to all exercises with minimal complaint of pain, mainly with chin tuck/cervical flexion/extension AROM. Treatment shorter today due to pt needing to leave for work. Did not do traction today and will  see how pt responds.    PT Next Visit Plan Therex to tolerance for scapular and c-spine stability as well as c-spine AROM; c-spine mechanical traction with IFC PRN; manual and dry needling PRN; manual therapy for ROM PRN   Consulted and Agree with Plan of Care Patient        Problem List Patient Active Problem List   Diagnosis Date Noted  . Arrhythmia 05/21/2014  . Nonspecific abnormal electrocardiogram (ECG) (EKG) 03/20/2014  . OSA (obstructive sleep apnea) 09/06/2012  . Allergic rhinoconjunctivitis, seasonal and perennial 07/02/2012  . History of colonic polyps 02/09/2010  . METABOLIC SYNDROME X 71/21/9758  . GERD 09/11/2008  . CHOLELITHIASIS 09/11/2008  . FATTY LIVER DISEASE, HX OF 09/11/2008  . HYPOTHYROIDISM 09/11/2007  . Hyperlipidemia 09/11/2007  . HYPERTENSION 09/11/2007  . MEMORY LOSS 09/11/2007  . CARCINOMA, BASAL CELL, HX OF 05/09/2006    Otelia Limes Calera, PTA 08/03/2014, 8:26 AM  [facility] [address]  [place], [place], [address] Phone: [phone]   Fax:  [phone]

## 2014-08-06 ENCOUNTER — Ambulatory Visit: Payer: BLUE CROSS/BLUE SHIELD | Admitting: Physical Therapy

## 2014-08-06 DIAGNOSIS — M436 Torticollis: Secondary | ICD-10-CM

## 2014-08-06 DIAGNOSIS — M542 Cervicalgia: Secondary | ICD-10-CM

## 2014-08-06 DIAGNOSIS — M792 Neuralgia and neuritis, unspecified: Secondary | ICD-10-CM | POA: Diagnosis not present

## 2014-08-06 NOTE — Therapy (Signed)
[facility] [place] [facility] [address]  [place], [place], [address] Phone: [phone]   Fax:  [phone]  Physical Therapy Treatment  Patient Details  Name: [name] [name] MRN: [id number] Date of Birth: May 09, 1955 Referring Provider:  Earlie Server, MD  Encounter Date: 08/06/2014      PT End of Session - 08/06/14 0802    Visit Number 15   Number of Visits 20   Date for PT Re-Evaluation 08/28/14   PT Start Time 4854   PT Stop Time 0822   PT Time Calculation (min) 65 min      Past Medical History  Diagnosis Date  . Hyperlipemia   . Hypertension   . Hypothyroidism   . Cholelithiasis   . Hepatic steatosis   . PONV (postoperative nausea and vomiting)   . Arthritis     Past Surgical History  Procedure Laterality Date  . Abdominal hysterectomy      @ age 59, due to fibroids   . Tonsillectomy    . Mohs surgery  2004    Basal Cell   . Colonoscopy w/ polypectomy  2010     Dr.Jacobs, due 2015   . Dilation and curettage of uterus    . Total knee arthroplasty  09/22/2011    Dr Nicholes Stairs, Left;    There were no vitals filed for this visit.  Visit Diagnosis:  Neck stiffness  Neck pain  Radicular pain in left arm      Subjective Assessment - 08/06/14 0719    Subjective no pain, intermittent n/t L UE with prone lying and sometimes while at work   Currently in Pain? No/denies            [facility] PT Assessment - 08/06/14 0001    AROM   AROM Assessment Site Cervical   Cervical Extension 40   Cervical - Right Rotation 50   Cervical - Left Rotation 50        TODAY'S TREATMENT Manual - C-spine Grade 3 B rotation and SB mobes (no c/o L UE symptoms today but very stiff with mild pain around L C4), contract/relax into B rotation  TherEx - Hooklying on Foam Roll: t-spine extension stretch 2', D2 Flexion with C-Spine AROM Green TB 12x each, ALT Horiz ABD with ALT C-spine Rotation AROM 10x each with 4# db,  Pullover 8# 15x with C-Spine Flex/Ext AROM 15x POE Alt C-spine Rotation AROM 10x each (noted mild L scapular pain following this exercise) Standing B Ext with Retraction Red TB 10x5"  Mechanical Traction - c-spine, 20dg pull, 18#/9#, 60"/20", 18' with IFC (80-150Hz ) x 20' to                        PT Short Term Goals - 06/16/14 1610    PT SHORT TERM GOAL #1   Title pt independent with HEP by 06/19/14   Status Achieved           PT Long Term Goals - 08/06/14 0801    PT LONG TERM GOAL #1   Title pt independent with advanced HEP as necessary by 08/28/14   Status On-going   PT LONG TERM GOAL #2   Title c-spine B Rotation and SB within 5 degree of symmetry and improved to 60 dg rotation and 25 side-bending without c/o pain or radicular symptoms by 08/28/14   Status On-going   PT LONG TERM GOAL #3   Title pt able to perform  work duties, chores, and ADLS without limitation by neck pain, LOM, or radicular symptoms by 08/28/14   Status On-going               Plan - 08/06/14 0750    Clinical Impression Statement able to tolerate grade 3 mobes without symptom provocation for first time, much improved ability with exercises as well but did not mild L scapular pain following POE c-spine AROM.   PT Next Visit Plan Therex to tolerance for scapular and c-spine stability as well as c-spine AROM; c-spine mechanical traction with IFC PRN; manual and dry needling PRN; manual therapy for ROM   Consulted and Agree with Plan of Care Patient        Problem List Patient Active Problem List   Diagnosis Date Noted  . Arrhythmia 05/21/2014  . Nonspecific abnormal electrocardiogram (ECG) (EKG) 03/20/2014  . OSA (obstructive sleep apnea) 09/06/2012  . Allergic rhinoconjunctivitis, seasonal and perennial 07/02/2012  . History of colonic polyps 02/09/2010  . METABOLIC SYNDROME X 74/12/8784  . GERD 09/11/2008  . CHOLELITHIASIS 09/11/2008  . FATTY LIVER DISEASE, HX OF 09/11/2008   . HYPOTHYROIDISM 09/11/2007  . Hyperlipidemia 09/11/2007  . HYPERTENSION 09/11/2007  . MEMORY LOSS 09/11/2007  . CARCINOMA, BASAL CELL, HX OF 05/09/2006    Hosam Mcfetridge PT, OCS 08/06/2014, 8:42 AM  [facility] [place], [place], [address] Phone: [phone]   Fax:  [phone]

## 2014-08-11 ENCOUNTER — Encounter: Payer: BLUE CROSS/BLUE SHIELD | Admitting: Physical Therapy

## 2014-08-13 ENCOUNTER — Ambulatory Visit: Payer: BLUE CROSS/BLUE SHIELD | Admitting: Physical Therapy

## 2014-08-18 ENCOUNTER — Ambulatory Visit: Payer: BLUE CROSS/BLUE SHIELD | Admitting: Physical Therapy

## 2014-08-18 DIAGNOSIS — M792 Neuralgia and neuritis, unspecified: Secondary | ICD-10-CM

## 2014-08-18 DIAGNOSIS — M436 Torticollis: Secondary | ICD-10-CM

## 2014-08-18 DIAGNOSIS — M542 Cervicalgia: Secondary | ICD-10-CM

## 2014-08-18 NOTE — Therapy (Signed)
[facility] [address]  [place], [place], [address] Phone: [phone]   Fax:  [phone]  Physical Therapy Treatment  Patient Details  Name: [name] [name] MRN: [id number] Date of Birth: June 26, 1955 Referring Provider:  Earlie Server, MD  Encounter Date: 08/18/2014      PT End of Session - 08/18/14 0720    Visit Number 16   Number of Visits 20   Date for PT Re-Evaluation 08/28/14   PT Start Time 0720   PT Stop Time 0750   PT Time Calculation (min) 30 min      Past Medical History  Diagnosis Date  . Hyperlipemia   . Hypertension   . Hypothyroidism   . Cholelithiasis   . Hepatic steatosis   . PONV (postoperative nausea and vomiting)   . Arthritis     Past Surgical History  Procedure Laterality Date  . Abdominal hysterectomy      @ age 54, due to fibroids   . Tonsillectomy    . Mohs surgery  2004    Basal Cell   . Colonoscopy w/ polypectomy  2010     Dr.Jacobs, due 2015   . Dilation and curettage of uterus    . Total knee arthroplasty  09/22/2011    Dr Nicholes Stairs, Left;    There were no vitals filed for this visit.  Visit Diagnosis:  Neck stiffness  Neck pain  Radicular pain in left arm      Subjective Assessment - 08/18/14 0720    Subjective states performing chin tuck exercise is much less painful (minimal to no pain currently whereas had been quite painful initially and unable to perform).  States "not much" when asked about UE n/t. Still no problems with sleeping.   Currently in Pain? No/denies            [facility] PT Assessment - 08/18/14 0001    AROM   AROM Assessment Site Cervical   Cervical Extension 45   Cervical - Right Side Bend 24   Cervical - Left Side Bend 21   Cervical - Right Rotation 50   Cervical - Left Rotation 50     TODAY'S TREATMENT TherEx - Hooklying on Foam Roll: t-spine extension stretch 2', Pullover 10# 15x with C-Spine Flex/Ext AROM 15x, D2 Flexion  Blue TB 12x each  Low Row 35# 2x15 Corner Pec Stretch with chin tuck 3x20" Standing ALT UE Flex with Contra Ext Red TB 10x each with c-spine AROM following Flexing UE. Standing one-arm low row Double Black TB 15x each  Pt declined traction today stating she had to leave early today.                         PT Education - 08/18/14 0754    Education provided Yes   Education Details standing one arm row while maintaining upright posture   Person(s) Educated Patient   Methods Explanation;Demonstration   Comprehension Verbalized understanding;Returned demonstration          PT Short Term Goals - 06/16/14 1610    PT SHORT TERM GOAL #1   Title pt independent with HEP by 06/19/14   Status Achieved           PT Long Term Goals - 08/18/14 0728    PT LONG TERM GOAL #1   Title pt independent with advanced HEP as necessary by 08/28/14   Status On-going  Plan - 08/18/14 0753    Clinical Impression Statement pt with improvement in AROM Extension and retraction but no changes in Rot or SB lately.  Pt is nearly symptom-free at this point and will likely discharge at next treatment.   PT Next Visit Plan most likely discharge at next treatment   Consulted and Agree with Plan of Care Patient        Problem List Patient Active Problem List   Diagnosis Date Noted  . Arrhythmia 05/21/2014  . Nonspecific abnormal electrocardiogram (ECG) (EKG) 03/20/2014  . OSA (obstructive sleep apnea) 09/06/2012  . Allergic rhinoconjunctivitis, seasonal and perennial 07/02/2012  . History of colonic polyps 02/09/2010  . METABOLIC SYNDROME X 78/29/5621  . GERD 09/11/2008  . CHOLELITHIASIS 09/11/2008  . FATTY LIVER DISEASE, HX OF 09/11/2008  . HYPOTHYROIDISM 09/11/2007  . Hyperlipidemia 09/11/2007  . HYPERTENSION 09/11/2007  . MEMORY LOSS 09/11/2007  . CARCINOMA, BASAL CELL, HX OF 05/09/2006    Kassie Keng PT, OCS 08/18/2014, 7:55 AM  [facility], [place], [address] Phone: [phone]   Fax:  [phone]

## 2014-08-20 ENCOUNTER — Ambulatory Visit: Payer: BLUE CROSS/BLUE SHIELD | Admitting: Physical Therapy

## 2014-08-25 ENCOUNTER — Ambulatory Visit: Payer: BLUE CROSS/BLUE SHIELD | Attending: Physical Medicine and Rehabilitation | Admitting: Physical Therapy

## 2014-08-25 DIAGNOSIS — M436 Torticollis: Secondary | ICD-10-CM

## 2014-08-25 DIAGNOSIS — M542 Cervicalgia: Secondary | ICD-10-CM

## 2014-08-25 DIAGNOSIS — M792 Neuralgia and neuritis, unspecified: Secondary | ICD-10-CM

## 2014-08-25 NOTE — Therapy (Signed)
[facility] [address]  [address] [place], [place], [address] Phone: [phone]   Fax:  [phone]  Physical Therapy Treatment  Patient Details  Name: [name] [name] MRN: [id number] Date of Birth: 12/17/1955 Referring Provider:  Caffrey, Daniel, MD  Encounter Date: 08/25/2014      PT End of Session - 08/25/14 0803    Visit Number 17   Number of Visits 20   Date for PT Re-Evaluation 08/28/14   PT Start Time 0800   PT Stop Time 0842   PT Time Calculation (min) 42 min      Past Medical History  Diagnosis Date  . Hyperlipemia   . Hypertension   . Hypothyroidism   . Cholelithiasis   . Hepatic steatosis   . PONV (postoperative nausea and vomiting)   . Arthritis     Past Surgical History  Procedure Laterality Date  . Abdominal hysterectomy      @ age 40, due to fibroids   . Tonsillectomy    . Mohs surgery  2004    Basal Cell   . Colonoscopy w/ polypectomy  2010     Dr.Jacobs, due 2015   . Dilation and curettage of uterus    . Total knee arthroplasty  09/22/2011    Dr Caffey, Left;    There were no vitals filed for this visit.  Visit Diagnosis:  Neck stiffness  Neck pain  Radicular pain in left arm      Subjective Assessment - 08/25/14 0800    Subjective states has not taken pain meds in past few days and is noting some L upper scapular pain lately.   Currently in Pain? Yes   Pain Score 4    Pain Location Scapula   Pain Orientation Left;Upper   Pain Descriptors / Indicators Aching   Aggravating Factors  c-spine rotation   Pain Relieving Factors medication            [facility] PT Assessment - 08/25/14 0001    AROM   AROM Assessment Site Cervical   Cervical Extension 47   Cervical - Right Side Bend 27   Cervical - Left Side Bend 18  L lower neck/upper scap pain   Cervical - Right Rotation 50  L lower neck/upper scap pain   Cervical - Left Rotation 50  L lower neck / upper scap pain        TODAY'S TREATMENT TherEx - UBE lvl 3.0 90"/90" Manual - TPR L upper t-spine paraspinals and LS Foam Roller t-spine extension rolling (HEP instruction) TherEx - Staggered Standing One Arm Row Double Black TB 15x each (with pt maintaining chin tuck)                         PT Short Term Goals - 06/16/14 1610    PT SHORT TERM GOAL #1   Title pt independent with HEP by 06/19/14   Status Achieved           PT Long Term Goals - 08/25/14 0845    PT LONG TERM GOAL #1   Title pt independent with advanced HEP as necessary by 08/28/14   Status Achieved   PT LONG TERM GOAL #2   Title c-spine B Rotation and SB within 5 degree of symmetry and improved to 60 dg rotation and 25 side-bending without c/o pain or radicular symptoms by 08/28/14  c-spine rotation is symmetric but still less then 60dg.    side bending to L no 25.   Status Partially Met   PT LONG TERM GOAL #3   Title pt able to perform work duties, chores, and ADLS without limitation by neck pain, LOM, or radicular symptoms by 08/28/14  still with intermittent symptoms but states does no limit function   Status Achieved   PT LONG TERM GOAL #4   Title pt able to sleep without limitation by pain or radicular symptoms   Status Achieved               Plan - 08/25/14 0843    Clinical Impression Statement pt independent with HEP and AROM progress has plateaued.  She stopped taking pain meds lately but is noting some L upper scap/t-spine pain today.  This pain increases with c-spine rotation.  Performed TPR to the area with some benefit and showed pt TheraCane as an option for self-treating this area.  Pt is being placed on hold from PT while she performs HEP.  She is to contact us if her pain increases.   PT Next Visit Plan pt on hold until 09/24/14   Consulted and Agree with Plan of Care Patient        Problem List Patient Active Problem List   Diagnosis Date Noted  . Arrhythmia 05/21/2014  . Nonspecific  abnormal electrocardiogram (ECG) (EKG) 03/20/2014  . OSA (obstructive sleep apnea) 09/06/2012  . Allergic rhinoconjunctivitis, seasonal and perennial 07/02/2012  . History of colonic polyps 02/09/2010  . METABOLIC SYNDROME X 09/11/2008  . GERD 09/11/2008  . CHOLELITHIASIS 09/11/2008  . FATTY LIVER DISEASE, HX OF 09/11/2008  . HYPOTHYROIDISM 09/11/2007  . Hyperlipidemia 09/11/2007  . HYPERTENSION 09/11/2007  . MEMORY LOSS 09/11/2007  . CARCINOMA, BASAL CELL, HX OF 05/09/2006    , PT, OCS 08/25/2014, 8:47 AM  [facility] [place] [address]  [address] [place], [place], [address] Phone: [phone]   Fax:  [phone]      

## 2014-09-09 ENCOUNTER — Ambulatory Visit: Payer: BLUE CROSS/BLUE SHIELD | Admitting: Physical Therapy

## 2014-09-09 DIAGNOSIS — M436 Torticollis: Secondary | ICD-10-CM

## 2014-09-09 DIAGNOSIS — M542 Cervicalgia: Secondary | ICD-10-CM

## 2014-09-09 DIAGNOSIS — M792 Neuralgia and neuritis, unspecified: Secondary | ICD-10-CM

## 2014-09-09 NOTE — Therapy (Signed)
[facility] [address]  [place], [place], [address] Phone: [phone]   Fax:  [phone]  Physical Therapy Treatment  Patient Details  Name: [name] [name] MRN: [id number] Date of Birth: 1955-04-14 Referring Provider:  Earlie Server, MD  Encounter Date: 09/09/2014      PT End of Session - 09/09/14 1110    Visit Number 18   Number of Visits 25   Date for PT Re-Evaluation 10/07/14   PT Start Time 1107   PT Stop Time 1165   PT Time Calculation (min) 49 min      Past Medical History  Diagnosis Date  . Hyperlipemia   . Hypertension   . Hypothyroidism   . Cholelithiasis   . Hepatic steatosis   . PONV (postoperative nausea and vomiting)   . Arthritis     Past Surgical History  Procedure Laterality Date  . Abdominal hysterectomy      @ age 72, due to fibroids   . Tonsillectomy    . Mohs surgery  2004    Basal Cell   . Colonoscopy w/ polypectomy  2010     Dr.Jacobs, due 2015   . Dilation and curettage of uterus    . Total knee arthroplasty  09/22/2011    Dr Nicholes Stairs, Left;    There were no vitals filed for this visit.  Visit Diagnosis:  Neck stiffness  Neck pain  Radicular pain in left arm      Subjective Assessment - 09/09/14 1110    Subjective pt not seen here since 08/25/14 at which time she was placed on hold due to good progress and nearly symptom free.  She returns today due to c/o return of symptoms over the past week. Unknown reason for return of pain.  States she has been performing HEP and whereas that helps, symptoms will no go away.  States notes n/t in R #2-3 fingers while in bed.  Notes most neck pain with prolonged sitting at work.  Has f/u with Dr. Ron Agee on 09/15/14.   Currently in Pain? Yes   Pain Score --  AVG pain 8/10 past few days, 2/10 at best with medication   Pain Location Neck  L neck and upper scapula   Pain Orientation Left   Pain Descriptors / Indicators  Aching   Pain Radiating Towards extends along upper and mid t-spine paraspinals   Pain Frequency Constant   Aggravating Factors  prolonged sitting/static postures   Pain Relieving Factors medication            [facility] PT Assessment - 09/09/14 0001    AROM   AROM Assessment Site Cervical   Cervical Extension 44  L neck pain   Cervical - Right Side Bend 20   Cervical - Left Side Bend 16   Cervical - Right Rotation 53   Cervical - Left Rotation 47  increased L ache       TODAY'S TREATMENT Neck AROM assessment Manual - pt R side-lying, L scapular mobes grade 2-3 to help decrease guarding alternating with sets of L 1st rib caudal glides grade 2-3.  Pt notes significant reduction in symptoms during caudal glides.  Following this performed seated manual shoulder rolls to further help with decreased guarding/pain.  Pt reports decreased pain from 8/10 at start of treatment to 4-5/10 following manual.  Mechanical Traction - c-spine, 20dg pull, 60"/20", 16#/8#, 16' (pain further decreased to 3-4/10 following traction)  PT Short Term Goals - 06/16/14 1610    PT SHORT TERM GOAL #1   Title pt independent with HEP by 06/19/14   Status Achieved           PT Long Term Goals - 09/09/14 1214    PT LONG TERM GOAL #1   Title pt independent with advanced HEP as necessary by 10/07/14  past HEP helps but may alter to see if can have more benefit   Status Revised   PT LONG TERM GOAL #2   Title c-spine B Rotation and SB within 5 degree of symmetry and improved to 60 dg rotation and 25 side-bending without c/o pain or radicular symptoms by 10/07/14  worsened in most planes today with increased pain   Status On-going   PT LONG TERM GOAL #3   Title pt able to perform work duties, chores, and ADLS without limitation by neck pain, LOM, or radicular symptoms by 10/07/14  goal was met in past but pain returned.   Status Revised   PT LONG TERM GOAL #4   Title pt able to sleep without  limitation by pain or radicular symptoms by 10/07/14  goal was met in past but symptoms returned   Status Revised               Plan - 09/09/14 1205    Clinical Impression Statement pt returns to PT today after being on hold since 08/25/15.  She was placed on hold due to progressing very well and feeling comfortable with self-management at that time.  She states her pain returned several days ago and has returned to PT to address this.  She states her pain has been 8/10 on average lately, is constant in nature (rating 2/10 at best with medication) and is located in L lower neck, L upper scapula, and along L upper t-spine paraspinals.  She reports return of intermittent n/t to L #2-3 digits while in bed at night.  Today performed gentle manual techniques to help decrease tone/guarding throughout lower neck and scapular muscles and pt reported decreased pain from 8/10 to 4-5/10.  Then performed 16 minutes of mechanical traction and pt noted further reduction in pain to 3-4/10.  Pt states she did not take pain meds today in order to have symptoms during assessment (I advised her that that was not necessary).  Given pt's return of rather severe pain, I am recommending she return to PT 2x/wk for up to 4 wks.  I'm hopeful she'll progress quickly given positive outcome from today's treatment and will not require 4 weeks however.  She has f/u with Dr. Ron Agee on 09/15/14.   Pt will benefit from skilled therapeutic intervention in order to improve on the following deficits Postural dysfunction;Decreased range of motion;Pain;Improper body mechanics   Rehab Potential Good   PT Frequency 2x / week   PT Duration 4 weeks   PT Treatment/Interventions Manual techniques;Traction;Therapeutic exercise;Patient/family education;ADLs/Self Care Home Management;Electrical Stimulation;Moist Heat;Cryotherapy;Ultrasound;Therapeutic activities;Dry needling   PT Next Visit Plan manual and mechanical traction as necessary, other  modalities PRN; scapular and cervical stability exercises   Consulted and Agree with Plan of Care Patient        Problem List Patient Active Problem List   Diagnosis Date Noted  . Arrhythmia 05/21/2014  . Nonspecific abnormal electrocardiogram (ECG) (EKG) 03/20/2014  . OSA (obstructive sleep apnea) 09/06/2012  . Allergic rhinoconjunctivitis, seasonal and perennial 07/02/2012  . History of colonic polyps 02/09/2010  . METABOLIC SYNDROME X 41/74/0814  .  GERD 09/11/2008  . CHOLELITHIASIS 09/11/2008  . FATTY LIVER DISEASE, HX OF 09/11/2008  . HYPOTHYROIDISM 09/11/2007  . Hyperlipidemia 09/11/2007  . HYPERTENSION 09/11/2007  . MEMORY LOSS 09/11/2007  . CARCINOMA, BASAL CELL, HX OF 05/09/2006    Abbigaile Rockman PT, OCS 09/09/2014, 12:18 PM  [facility] [place], [place], [address] Phone: [phone]   Fax:  [phone]

## 2014-09-14 ENCOUNTER — Ambulatory Visit: Payer: BLUE CROSS/BLUE SHIELD | Admitting: Rehabilitation

## 2014-09-16 ENCOUNTER — Ambulatory Visit: Payer: BLUE CROSS/BLUE SHIELD | Admitting: Rehabilitation

## 2014-09-16 DIAGNOSIS — M542 Cervicalgia: Secondary | ICD-10-CM

## 2014-09-16 DIAGNOSIS — M436 Torticollis: Secondary | ICD-10-CM | POA: Diagnosis not present

## 2014-09-16 DIAGNOSIS — M792 Neuralgia and neuritis, unspecified: Secondary | ICD-10-CM

## 2014-09-16 NOTE — Therapy (Signed)
[facility] [address]  [place], [place], [address] Phone: [phone]   Fax:  [phone]  Physical Therapy Treatment  Patient Details  Name: [name] [name] MRN: [id number] Date of Birth: 11-01-1955 Referring Provider:  Laroy Apple, MD  Encounter Date: 09/16/2014      PT End of Session - 09/16/14 0918    Visit Number 19   Number of Visits 25   Date for PT Re-Evaluation 10/07/14   PT Start Time 0920   PT Stop Time 6761   PT Time Calculation (min) 55 min      Past Medical History  Diagnosis Date  . Hyperlipemia   . Hypertension   . Hypothyroidism   . Cholelithiasis   . Hepatic steatosis   . PONV (postoperative nausea and vomiting)   . Arthritis     Past Surgical History  Procedure Laterality Date  . Abdominal hysterectomy      @ age 59, due to fibroids   . Tonsillectomy    . Mohs surgery  2004    Basal Cell   . Colonoscopy w/ polypectomy  2010     Dr.Jacobs, due 2015   . Dilation and curettage of uterus    . Total knee arthroplasty  09/22/2011    Dr Nicholes Stairs, Left;    There were no vitals filed for this visit.  Visit Diagnosis:  Neck stiffness  Neck pain  Radicular pain in left arm      Subjective Assessment - 09/16/14 0923    Subjective Reports neck is feeling better but has been sick the past few days. Has noted that turning her head to side to side (rotation) is the main factor that has been causing her neck to hurt. And would like to to do the electrical stimulation along with the traction.   Currently in Pain? Yes   Pain Score 2    Pain Location Neck   Pain Orientation Left            [facility] PT Assessment - 09/16/14 0923    Observation/Other Assessments   Focus on Therapeutic Outcomes (FOTO)  35% limitation      TODAY'S TREATMENT TherEx - UBE level 2.0 90"/90" Hooklying on Foam Roll: t-spine extension stretch 2', Pullover 10# 15x with C-Spine Flex/Ext AROM 15x, D2  Flexion Blue TB 12x each, B Horizontal Abduction Blue TB 15x  Corner Pec Stretch with chin tuck 3x20" Corner Scap Retract 10x (pt noted Lt UE discomfort but no pain)  Manual - pt R side-lying, L scapular mobes grade 2-3 to help decrease guarding then STM to Lt scapular borders (medial focus), UT, t-spine and c-spine paraspinals.   Mechanical Traction - c-spine, 20dg pull, 60"/20", 18#/9#, 16' IFC (80-150) x 20' to Lt scapular region during traction      PT Short Term Goals - 06/16/14 1610    PT SHORT TERM GOAL #1   Title pt independent with HEP by 06/19/14   Status Achieved           PT Long Term Goals - 09/16/14 0920    PT LONG TERM GOAL #1   Title pt independent with advanced HEP as necessary by 10/07/14   Status On-going   PT LONG TERM GOAL #2   Title c-spine B Rotation and SB within 5 degree of symmetry and improved to 60 dg rotation and 25 side-bending without c/o pain or radicular symptoms by 10/07/14   Status On-going   PT  LONG TERM GOAL #3   Title pt able to perform work duties, chores, and ADLS without limitation by neck pain, LOM, or radicular symptoms by 10/07/14   Status On-going   PT LONG TERM GOAL #4   Title pt able to sleep without limitation by pain or radicular symptoms by 10/07/14   Status On-going               Plan - 09/16/14 0958    Clinical Impression Statement Good tolerance to exercises today but pt did notice UE discomfort with corner retractions, no pain, just discomfort. Continued with manual work today with pt reporting relief. Pt also stated that she wanted to try and electrical stimulation in addition to the traction today. Performed this with good report per pt. Also increased traction time to 18#.   PT Next Visit Plan manual and mechanical traction as necessary, other modalities PRN; scapular and cervical stability exercises   Consulted and Agree with Plan of Care Patient        Problem List Patient Active Problem List   Diagnosis Date  Noted  . Arrhythmia 05/21/2014  . Nonspecific abnormal electrocardiogram (ECG) (EKG) 03/20/2014  . OSA (obstructive sleep apnea) 09/06/2012  . Allergic rhinoconjunctivitis, seasonal and perennial 07/02/2012  . History of colonic polyps 02/09/2010  . METABOLIC SYNDROME X 76/16/0737  . GERD 09/11/2008  . CHOLELITHIASIS 09/11/2008  . FATTY LIVER DISEASE, HX OF 09/11/2008  . HYPOTHYROIDISM 09/11/2007  . Hyperlipidemia 09/11/2007  . HYPERTENSION 09/11/2007  . MEMORY LOSS 09/11/2007  . CARCINOMA, BASAL CELL, HX OF 05/09/2006    Otelia Limes Koloa, PTA 09/16/2014, 10:01 AM  [facility] [address]  [place], [place], [address] Phone: [phone]   Fax:  [phone]

## 2014-09-23 ENCOUNTER — Ambulatory Visit: Payer: BLUE CROSS/BLUE SHIELD | Admitting: Physical Therapy

## 2014-09-23 DIAGNOSIS — M542 Cervicalgia: Secondary | ICD-10-CM

## 2014-09-23 DIAGNOSIS — M436 Torticollis: Secondary | ICD-10-CM | POA: Diagnosis not present

## 2014-09-23 DIAGNOSIS — M792 Neuralgia and neuritis, unspecified: Secondary | ICD-10-CM

## 2014-09-23 NOTE — Therapy (Signed)
[facility] [address]  [place], [place], [address] Phone: [phone]   Fax:  [phone]  Physical Therapy Treatment  Patient Details  Name: [name] [name] MRN: [id number] Date of Birth: 02-01-1955 Referring Provider:  Laroy Apple, MD  Encounter Date: 09/23/2014      PT End of Session - 09/23/14 0918    Visit Number 20   Number of Visits 25   Date for PT Re-Evaluation 10/07/14   PT Start Time 0807   PT Stop Time 0910   PT Time Calculation (min) 63 min      Past Medical History  Diagnosis Date  . Hyperlipemia   . Hypertension   . Hypothyroidism   . Cholelithiasis   . Hepatic steatosis   . PONV (postoperative nausea and vomiting)   . Arthritis     Past Surgical History  Procedure Laterality Date  . Abdominal hysterectomy      @ age 89, due to fibroids   . Tonsillectomy    . Mohs surgery  2004    Basal Cell   . Colonoscopy w/ polypectomy  2010     Dr.Jacobs, due 2015   . Dilation and curettage of uterus    . Total knee arthroplasty  09/22/2011    Dr Nicholes Stairs, Left;    There were no vitals filed for this visit.  Visit Diagnosis:  Neck pain  Radicular pain in left arm  Neck stiffness      Subjective Assessment - 09/23/14 0809    Subjective States neck pain has been 3/10 on AVG but has had periods with increased pain within the past week. States changed mechanics of home laptop use last week and has noted significantly less discomfort with using latop at home.  Denies N/T.   Currently in Pain? Yes   Pain Score 3    Pain Location Neck   Pain Orientation Left;Lower   Pain Radiating Towards extends into upper L t-spine            [facility] PT Assessment - 09/23/14 0001    Assessment   Next MD Visit 10/13/14        TODAY'S TREATMENT TherEx - UBE level 2.0 90"/90"  Mechanical Traction - c-spine, 20dg pull, 60"/20", 18#/9#, 16' IFC (80-150) x 18' to B c-spine and upper  t-spine during traction (pain nearly abolished following this)  TherEx -  Hooklying on Foam Roll: t-spine extension stretch 2', Pullover 10# 15x, B D2 Flexion 3# 15x R Side-Lying L ER 3# 15x, L ABD 0# 10x Seated edge of plinth pball rollout lat stetch 5x10" eaach Corner Pec Stretch with chin tuck 2x20" Corner Pushout 10x (performs with more shoulder extension rather than scap retraction) Shrugs 10# 15x 1/2 Kneeling one arm scap retraction (rather than entire rowing motion, just trying to get pt to perform scap retraction component            PT Short Term Goals - 06/16/14 1610    PT SHORT TERM GOAL #1   Title pt independent with HEP by 06/19/14   Status Achieved           PT Long Term Goals - 09/23/14 0925    PT LONG TERM GOAL #1   Title pt independent with advanced HEP as necessary by 10/07/14   Status On-going   PT LONG TERM GOAL #2   Title c-spine B Rotation and SB within 5 degree of symmetry and improved to 60 dg  rotation and 25 side-bending without c/o pain or radicular symptoms by 10/07/14   Status On-going   PT LONG TERM GOAL #3   Title pt able to perform work duties, chores, and ADLS without limitation by neck pain, LOM, or radicular symptoms by 10/07/14   Status On-going   PT LONG TERM GOAL #4   Title pt able to sleep without limitation by pain or radicular symptoms by 10/07/14   Status On-going               Plan - 09/23/14 0923    Clinical Impression Statement performed traction at start of treatment and symptoms basically abolished with this.  Pt able to perform all exercises without return of pain.  She has difficulty seperating scapular and shoulder motions and often over uses either upper traps or deltoids with activities that should involve more mid traps and rhomboids.  Attempting to work on this to hopefully decrease c-spine strain.   PT Next Visit Plan body mechanics training to promote scap retraction; manual and mechanical traction as necessary,  other modalities PRN; scapular and cervical stability exercises   Consulted and Agree with Plan of Care Patient        Problem List Patient Active Problem List   Diagnosis Date Noted  . Arrhythmia 05/21/2014  . Nonspecific abnormal electrocardiogram (ECG) (EKG) 03/20/2014  . OSA (obstructive sleep apnea) 09/06/2012  . Allergic rhinoconjunctivitis, seasonal and perennial 07/02/2012  . History of colonic polyps 02/09/2010  . METABOLIC SYNDROME X 85/88/5027  . GERD 09/11/2008  . CHOLELITHIASIS 09/11/2008  . FATTY LIVER DISEASE, HX OF 09/11/2008  . HYPOTHYROIDISM 09/11/2007  . Hyperlipidemia 09/11/2007  . HYPERTENSION 09/11/2007  . MEMORY LOSS 09/11/2007  . CARCINOMA, BASAL CELL, HX OF 05/09/2006    Tyri Elmore PT, OCS 09/23/2014, 9:25 AM  [facility] [place], [place], [address] Phone: [phone]   Fax:  [phone]

## 2014-09-25 ENCOUNTER — Ambulatory Visit: Payer: BLUE CROSS/BLUE SHIELD | Attending: Physical Medicine and Rehabilitation | Admitting: Physical Therapy

## 2014-09-25 DIAGNOSIS — M792 Neuralgia and neuritis, unspecified: Secondary | ICD-10-CM | POA: Diagnosis present

## 2014-09-25 DIAGNOSIS — M436 Torticollis: Secondary | ICD-10-CM | POA: Diagnosis present

## 2014-09-25 DIAGNOSIS — M542 Cervicalgia: Secondary | ICD-10-CM | POA: Diagnosis not present

## 2014-09-25 NOTE — Therapy (Signed)
[facility] [address]  [place], [place], [address] Phone: [phone]   Fax:  [phone]  Physical Therapy Treatment  Patient Details  Name: [name] [name] MRN: [id number] Date of Birth: 26-Oct-1955 Referring Provider:  Laroy Apple, MD  Encounter Date: 09/25/2014      PT End of Session - 09/25/14 0931    Visit Number 21   Number of Visits 25   Date for PT Re-Evaluation 10/07/14   PT Start Time 0928   PT Stop Time 1033   PT Time Calculation (min) 65 min      Past Medical History  Diagnosis Date  . Hyperlipemia   . Hypertension   . Hypothyroidism   . Cholelithiasis   . Hepatic steatosis   . PONV (postoperative nausea and vomiting)   . Arthritis     Past Surgical History  Procedure Laterality Date  . Abdominal hysterectomy      @ age 59, due to fibroids   . Tonsillectomy    . Mohs surgery  2004    Basal Cell   . Colonoscopy w/ polypectomy  2010     Dr.Jacobs, due 2015   . Dilation and curettage of uterus    . Total knee arthroplasty  09/22/2011    Dr Nicholes Stairs, Left;    There were no vitals filed for this visit.  Visit Diagnosis:  Neck pain  Radicular pain in left arm  Neck stiffness      Subjective Assessment - 09/25/14 0931    Subjective pain has been 2-3/10 since last treatment and is currently 2/10.  Is sleeping well.   Currently in Pain? Yes   Pain Score 2    Pain Location Neck   Pain Orientation Left;Lower          TODAY'S TREATMENT TherEx - UBE level 2.0 90"/90"  Manual - contract/relax L LS (noted increased pain following this), STM and gentle c-spine extension with hope of relaxing and decreasing pain.  Pain back to baseline following this.  No UE pain or radicular symptoms with manual today.  TherEx -  Hooklying on Foam Roll: t-spine extension stretch 2', Pullover 10# 15x, B Horix ABD 3# 15x (initially was performing B D2 Flexion 3# but began to note L UE  radicular pain so stopped) Foam rolling mid and upper t-spine extension 12x Standing "Y" with Yellow TB 12x (no UE radicular symptoms) 1/2 Kneeling one arm scap retraction with 10# db 12x each (again performed scap retraction component of one-arm row attempting to produce less UT contraction with scap retraction) Corner Pec Stretch with chin tuck 3x20" TRX Low Row 15x  Mechanical Traction - c-spine, 20dg pull, 60"/20", 18#/9#, 18' IFC (80-150) x 20' to B c-spine and upper t-spine during traction (pt still with increased pain following this in L upper scap with c-spine L rotation)                       PT Short Term Goals - 06/16/14 1610    PT SHORT TERM GOAL #1   Title pt independent with HEP by 06/19/14   Status Achieved           PT Long Term Goals - 09/23/14 0925    PT LONG TERM GOAL #1   Title pt independent with advanced HEP as necessary by 10/07/14   Status On-going   PT LONG TERM GOAL #2   Title c-spine B Rotation and SB  within 5 degree of symmetry and improved to 60 dg rotation and 25 side-bending without c/o pain or radicular symptoms by 10/07/14   Status On-going   PT LONG TERM GOAL #3   Title pt able to perform work duties, chores, and ADLS without limitation by neck pain, LOM, or radicular symptoms by 10/07/14   Status On-going   PT LONG TERM GOAL #4   Title pt able to sleep without limitation by pain or radicular symptoms by 10/07/14   Status On-going               Plan - 09/25/14 1014    Clinical Impression Statement pt with low baseline pain (2-3/10 lately) but still with highly irritable tissue in L c-spine.  Noted increased pain with LS stretching, noted L UE pain with D2 flexion supine but then able to perform standing "Y" without pain, at end of exercise states noted increased pain to L lower c-spine/upper scapular area.   PT Next Visit Plan body mechanics training to promote scap retraction; manual and mechanical traction as necessary,  other modalities PRN; scapular and cervical stability exercises   Consulted and Agree with Plan of Care Patient        Problem List Patient Active Problem List   Diagnosis Date Noted  . Arrhythmia 05/21/2014  . Nonspecific abnormal electrocardiogram (ECG) (EKG) 03/20/2014  . OSA (obstructive sleep apnea) 09/06/2012  . Allergic rhinoconjunctivitis, seasonal and perennial 07/02/2012  . History of colonic polyps 02/09/2010  . METABOLIC SYNDROME X 03/54/6568  . GERD 09/11/2008  . CHOLELITHIASIS 09/11/2008  . FATTY LIVER DISEASE, HX OF 09/11/2008  . HYPOTHYROIDISM 09/11/2007  . Hyperlipidemia 09/11/2007  . HYPERTENSION 09/11/2007  . MEMORY LOSS 09/11/2007  . CARCINOMA, BASAL CELL, HX OF 05/09/2006    Taylor Levick PT, OCS 09/25/2014, 10:35 AM  [facility] [place], [place], [address] Phone: [phone]   Fax:  [phone]

## 2014-09-29 ENCOUNTER — Ambulatory Visit: Payer: BLUE CROSS/BLUE SHIELD | Admitting: Physical Therapy

## 2014-09-29 DIAGNOSIS — M542 Cervicalgia: Secondary | ICD-10-CM | POA: Diagnosis not present

## 2014-09-29 DIAGNOSIS — M792 Neuralgia and neuritis, unspecified: Secondary | ICD-10-CM

## 2014-09-29 DIAGNOSIS — M436 Torticollis: Secondary | ICD-10-CM

## 2014-09-29 NOTE — Therapy (Signed)
[facility] [address]  [place], [place], [address] Phone: [phone]   Fax:  [phone]  Physical Therapy Treatment  Patient Details  Name: [name] [name] MRN: [id number] Date of Birth: [birth year] Referring Provider:  Laroy Apple, MD  Encounter Date: 09/29/2014      PT End of Session - 09/29/14 0931    Visit Number 22   Number of Visits 25   Date for PT Re-Evaluation 10/07/14   PT Start Time 0929   PT Stop Time 1020   PT Time Calculation (min) 51 min      Past Medical History  Diagnosis Date  . Hyperlipemia   . Hypertension   . Hypothyroidism   . Cholelithiasis   . Hepatic steatosis   . PONV (postoperative nausea and vomiting)   . Arthritis     Past Surgical History  Procedure Laterality Date  . Abdominal hysterectomy      @ age 59, due to fibroids   . Tonsillectomy    . Mohs surgery  2004    Basal Cell   . Colonoscopy w/ polypectomy  2010     Dr.Jacobs, due 2015   . Dilation and curettage of uterus    . Total knee arthroplasty  09/22/2011    Dr Nicholes Stairs, Left;    There were no vitals filed for this visit.  Visit Diagnosis:  Neck pain  Radicular pain in left arm  Neck stiffness      Subjective Assessment - 09/29/14 0929    Subjective States is feeling better lately stating is due to not moving neck through much ROM lately. Denies pain so far today.   Currently in Pain? No/denies      TODAY'S TREATMENT TherEx -  Hooklying on Foam Roll: c-spine B Rotation AROM with t-spine extension stretch 2', Pullover 10# 15x, B Horiz ABD 3# 10x, B D2 Flexion 3# 10x (able to perform today without issue today). Single Hand Low Row 15# 15x, 20# 15x Corner Pec stretch 2x20" Corner Pushout 10x Standing B Shoulder ABD 3# 2x15 Standing "Y" with Yellow TB 15x Quadruped UE 5x each (stopped due to L knee pain with kneeling)  Mechanical Traction - c-spine, 20dg pull, 60"/20", 18#/9#, 18' IFC  (80-150) x 20' to B c-spine and upper t-spine during traction (pt still with increased pain following this in L upper scap with c-spine L rotation)                             PT Short Term Goals - 06/16/14 1610    PT SHORT TERM GOAL #1   Title pt independent with HEP by 06/19/14   Status Achieved           PT Long Term Goals - 09/23/14 0925    PT LONG TERM GOAL #1   Title pt independent with advanced HEP as necessary by 10/07/14   Status On-going   PT LONG TERM GOAL #2   Title c-spine B Rotation and SB within 5 degree of symmetry and improved to 60 dg rotation and 25 side-bending without c/o pain or radicular symptoms by 10/07/14   Status On-going   PT LONG TERM GOAL #3   Title pt able to perform work duties, chores, and ADLS without limitation by neck pain, LOM, or radicular symptoms by 10/07/14   Status On-going   PT LONG TERM GOAL #4   Title pt able to  sleep without limitation by pain or radicular symptoms by 10/07/14   Status On-going               Plan - 09/29/14 1000    Clinical Impression Statement good performance today with no c/o pain or symptom provocation.  She states she has felt good since day following last treatment stating in part due to not performing c-spine rotation AROM into painful ranges.  Encouraging today given tolerance with activities and no pain.    PT Next Visit Plan continue body mechanics training to promote scap retraction; manual and mechanical traction as necessary, other modalities PRN; scapular and cervical stability exercises   Consulted and Agree with Plan of Care Patient        Problem List Patient Active Problem List   Diagnosis Date Noted  . Arrhythmia 05/21/2014  . Nonspecific abnormal electrocardiogram (ECG) (EKG) 03/20/2014  . OSA (obstructive sleep apnea) 09/06/2012  . Allergic rhinoconjunctivitis, seasonal and perennial 07/02/2012  . History of colonic polyps 02/09/2010  . METABOLIC SYNDROME X  48/27/0786  . GERD 09/11/2008  . CHOLELITHIASIS 09/11/2008  . FATTY LIVER DISEASE, HX OF 09/11/2008  . HYPOTHYROIDISM 09/11/2007  . Hyperlipidemia 09/11/2007  . HYPERTENSION 09/11/2007  . MEMORY LOSS 09/11/2007  . CARCINOMA, BASAL CELL, HX OF 05/09/2006    Cullan Launer PT, OCS 09/29/2014, 10:33 AM  [facility] [place], [place], [address] Phone: [phone]   Fax:  [phone]

## 2014-10-01 ENCOUNTER — Ambulatory Visit: Payer: BLUE CROSS/BLUE SHIELD | Admitting: Physical Therapy

## 2014-10-01 DIAGNOSIS — M436 Torticollis: Secondary | ICD-10-CM

## 2014-10-01 DIAGNOSIS — M792 Neuralgia and neuritis, unspecified: Secondary | ICD-10-CM

## 2014-10-01 DIAGNOSIS — M542 Cervicalgia: Secondary | ICD-10-CM

## 2014-10-01 NOTE — Therapy (Signed)
[facility] [address]  [place], [place], [address] Phone: [phone]   Fax:  [phone]  Physical Therapy Treatment  Patient Details  Name: [name] [name] MRN: [id number] Date of Birth: Oct 11, 1955 Referring Provider:  Laroy Apple, MD  Encounter Date: 10/01/2014      PT End of Session - 10/01/14 0845    Visit Number 23   Number of Visits 25   Date for PT Re-Evaluation 10/07/14   PT Start Time 0845   PT Stop Time 0939   PT Time Calculation (min) 54 min      Past Medical History  Diagnosis Date  . Hyperlipemia   . Hypertension   . Hypothyroidism   . Cholelithiasis   . Hepatic steatosis   . PONV (postoperative nausea and vomiting)   . Arthritis     Past Surgical History  Procedure Laterality Date  . Abdominal hysterectomy      @ age 64, due to fibroids   . Tonsillectomy    . Mohs surgery  2004    Basal Cell   . Colonoscopy w/ polypectomy  2010     Dr.Jacobs, due 2015   . Dilation and curettage of uterus    . Total knee arthroplasty  09/22/2011    Dr Nicholes Stairs, Left;    There were no vitals filed for this visit.  Visit Diagnosis:  Neck pain  Radicular pain in left arm  Neck stiffness      Subjective Assessment - 10/01/14 0845    Subjective states has not noted n/t since last treatment but still with painful stiffness in L lower neck rating 2/10 today.  No problems with sleeping (no pain or n/t).   Currently in Pain? Yes   Pain Score 2    Pain Location Neck   Pain Orientation Left;Lower         TODAY'S TREATMENT TherEx -  UBE lvl 2.0 1'/1' Seated one-arm row diagonal Double Black TB 2x15 Standing B Shoulder ABD 5# 2x15 Seated UT Stretch 3x10" (notes mild short duration n/t L UE with R side bending stretch) Seated "Y" with Red TB 10x Seated "W" with Red TB 10x Foam Roller t-spine extension rolling x30" Pullover along perpendicular foam roller 8# 15 Hooklying on Foam  Roll: c-spine B Rotation AROM with t-spine extension stretch 2', B Horiz ABD Green TB 15x, B ALT Flex with Contra Ext Green TB 10x each  Mechanical Traction - c-spine, 20dg pull, 60"/20", 18#/9#, 18' IFC (80-150) x 20' to B c-spine and upper t-spine during traction (pt still with increased pain following this in L upper scap with c-spine L rotation)               PT Short Term Goals - 06/16/14 1610    PT SHORT TERM GOAL #1   Title pt independent with HEP by 06/19/14   Status Achieved           PT Long Term Goals - 10/01/14 0924    PT LONG TERM GOAL #1   Title pt independent with advanced HEP as necessary by 10/07/14   Status On-going   PT LONG TERM GOAL #2   Title c-spine B Rotation and SB within 5 degree of symmetry and improved to 60 dg rotation and 25 side-bending without c/o pain or radicular symptoms by 10/07/14   Status On-going   PT LONG TERM GOAL #3   Title pt able to perform work duties, chores, and  ADLS without limitation by neck pain, LOM, or radicular symptoms by 10/07/14   Status On-going   PT LONG TERM GOAL #4   Title pt able to sleep without limitation by pain or radicular symptoms by 10/07/14   Status Achieved               Plan - 10/01/14 0921    Clinical Impression Statement noted short duration n/t L UE with c-spine R side-bending stretch which is likely nerve root adhession in nature (quick onset and immediate relief during/after stretch).  Overall good performance and seems to be making consistent progress lately.  Much better performance wih scap retraction isolation.   PT Next Visit Plan assess AROM; manual and mechanical traction as necessary, other modalities PRN; scapular and cervical stability exercises   Consulted and Agree with Plan of Care Patient        Problem List Patient Active Problem List   Diagnosis Date Noted  . Arrhythmia 05/21/2014  . Nonspecific abnormal electrocardiogram (ECG) (EKG) 03/20/2014  . OSA (obstructive  sleep apnea) 09/06/2012  . Allergic rhinoconjunctivitis, seasonal and perennial 07/02/2012  . History of colonic polyps 02/09/2010  . METABOLIC SYNDROME X 53/74/8270  . GERD 09/11/2008  . CHOLELITHIASIS 09/11/2008  . FATTY LIVER DISEASE, HX OF 09/11/2008  . HYPOTHYROIDISM 09/11/2007  . Hyperlipidemia 09/11/2007  . HYPERTENSION 09/11/2007  . MEMORY LOSS 09/11/2007  . CARCINOMA, BASAL CELL, HX OF 05/09/2006    [name] [name] PT, OCS 10/01/2014, 10:13 AM  [facility] [place], [place], [address] Phone: [phone]   Fax:  [phone]

## 2014-10-06 ENCOUNTER — Ambulatory Visit: Payer: BLUE CROSS/BLUE SHIELD | Admitting: Physical Therapy

## 2014-10-06 DIAGNOSIS — M542 Cervicalgia: Secondary | ICD-10-CM | POA: Diagnosis not present

## 2014-10-06 DIAGNOSIS — M792 Neuralgia and neuritis, unspecified: Secondary | ICD-10-CM

## 2014-10-06 DIAGNOSIS — M436 Torticollis: Secondary | ICD-10-CM

## 2014-10-06 NOTE — Therapy (Addendum)
[facility] [address]  [place], [place], [address] Phone: [phone]   Fax:  [phone]  Physical Therapy Treatment  Patient Details  Name: [name] [name] MRN: [id number] Date of Birth: 1955-06-19 Referring Provider:  Laroy Apple, MD  Encounter Date: 10/06/2014      PT End of Session - 10/06/14 0728    Visit Number 24   Number of Visits 25   Date for PT Re-Evaluation 10/07/14   PT Start Time 0721   PT Stop Time 0815   PT Time Calculation (min) 54 min      Past Medical History  Diagnosis Date  . Hyperlipemia   . Hypertension   . Hypothyroidism   . Cholelithiasis   . Hepatic steatosis   . PONV (postoperative nausea and vomiting)   . Arthritis     Past Surgical History  Procedure Laterality Date  . Abdominal hysterectomy      @ age 25, due to fibroids   . Tonsillectomy    . Mohs surgery  2004    Basal Cell   . Colonoscopy w/ polypectomy  2010     Dr.Jacobs, due 2015   . Dilation and curettage of uterus    . Total knee arthroplasty  09/22/2011    Dr Nicholes Stairs, Left;    There were no vitals filed for this visit.  Visit Diagnosis:  Neck pain  Radicular pain in left arm  Neck stiffness      Subjective Assessment - 10/06/14 0725    Subjective States is feeling exceptionally well lately and wants to hold PT for now while she performs HEP.  States has been basically pain-free lately but still notes intermittent L hand n/t (mostly noted upon waking in AM).  No difficulty sleeping.   Currently in Pain? No/denies            [facility] PT Assessment - 10/06/14 0001    AROM   Cervical Extension 52   Cervical - Right Side Bend 23   Cervical - Left Side Bend 22   Cervical - Right Rotation 48   Cervical - Left Rotation 49  notes mild ache down L c-spine and t-spine paraspinals      TODAY'S TREATMENT TherEx - UBE lvl 3.0 1'/1' Foam Roller mid and upper t-spine extension rolling  x30" Hooklying on Foam Roller perpendicular to spine pullover 10# 15x Hooklying on Foam Roller along spine: ALT UE Flex with contra Ext Green TB 10x each C-spine AROM Assessment High Row 20# 2x10 (focus on proper scapular positioning) Standing B Shoulder ABD 5# 15x Standing B Shoulder Flexion 5# 10x Staggered Standing one arm row diagonal Double Black TB 15x each  Mechanical Traction c-spine, 20dg pull, 18#/9#, 60"/20", 14' with IFC (80-_0 ) to B c-spine x 14'            PT Short Term Goals - 06/16/14 1610    PT SHORT TERM GOAL #1   Title pt independent with HEP by 06/19/14   Status Achieved           PT Long Term Goals - 10/06/14 0746    PT LONG TERM GOAL #1   Title pt independent with advanced HEP as necessary by 10/07/14   Status Achieved   PT LONG TERM GOAL #2   Title c-spine B Rotation and SB within 5 degree of symmetry and improved to 60 dg rotation and 25 side-bending without c/o pain or radicular symptoms by 10/07/14  rotation and SB symmetric but restricted   Status Partially Met   PT LONG TERM GOAL #3   Title pt able to perform work duties, chores, and ADLS without limitation by neck pain, LOM, or radicular symptoms by 10/07/14   Status Achieved   PT LONG TERM GOAL #4   Title pt able to sleep without limitation by pain or radicular symptoms by 10/07/14   Status Achieved               Plan - 10/06/14 0736    Clinical Impression Statement Pt progressing very well and has been basically pain-free lately. Notes continued intermittent n/t to L hand but this is mostly noted upon waking in AM so may not be related to c-spine. Pt is independent with current HEP.   PT Next Visit Plan pt on hold from PT due to good progress and I with HEP   Consulted and Agree with Plan of Care Patient        Problem List Patient Active Problem List   Diagnosis Date Noted  . Arrhythmia 05/21/2014  . Nonspecific abnormal electrocardiogram (ECG) (EKG) 03/20/2014  . OSA  (obstructive sleep apnea) 09/06/2012  . Allergic rhinoconjunctivitis, seasonal and perennial 07/02/2012  . History of colonic polyps 02/09/2010  . METABOLIC SYNDROME X 71/25/2712  . GERD 09/11/2008  . CHOLELITHIASIS 09/11/2008  . FATTY LIVER DISEASE, HX OF 09/11/2008  . HYPOTHYROIDISM 09/11/2007  . Hyperlipidemia 09/11/2007  . HYPERTENSION 09/11/2007  . MEMORY LOSS 09/11/2007  . CARCINOMA, BASAL CELL, HX OF 05/09/2006    Linkoln Alkire PT, OCS 10/06/2014, 8:20 AM  [facility] [address]  [place], [place], [address] Phone: [phone]   Fax:  [phone]    PHYSICAL THERAPY DISCHARGE SUMMARY  Visits from Start of Care: 24  Current functional level related to goals / functional outcomes: Goals met other than c-spine ROM due to continued restrictions.  She was last seen on 10/06/14 at which time she was basically pain-free but still noted intermittent n/t into L hand.   Remaining deficits: Neck stiffness, intermittent L UE n/t   Education / Equipment: HEP and posture correction Plan: Patient agrees to discharge.  Patient goals were partially met. Patient is being discharged due to being pleased with the current functional level.  ?????        Leonette Most PT, OCS 11/10/2014 8:00 AM

## 2014-10-08 ENCOUNTER — Ambulatory Visit: Payer: BLUE CROSS/BLUE SHIELD | Admitting: Physical Therapy

## 2014-10-13 ENCOUNTER — Encounter: Payer: Self-pay | Admitting: Gastroenterology

## 2014-10-13 ENCOUNTER — Ambulatory Visit: Payer: BLUE CROSS/BLUE SHIELD | Admitting: Physical Therapy

## 2014-10-15 ENCOUNTER — Ambulatory Visit: Payer: BLUE CROSS/BLUE SHIELD | Admitting: Physical Therapy

## 2014-12-30 ENCOUNTER — Encounter (HOSPITAL_BASED_OUTPATIENT_CLINIC_OR_DEPARTMENT_OTHER): Payer: Self-pay | Admitting: *Deleted

## 2014-12-30 ENCOUNTER — Emergency Department (HOSPITAL_BASED_OUTPATIENT_CLINIC_OR_DEPARTMENT_OTHER)
Admission: EM | Admit: 2014-12-30 | Discharge: 2014-12-30 | Disposition: A | Discharge status: Home / Self Care | Payer: BLUE CROSS/BLUE SHIELD | Attending: Emergency Medicine | Admitting: Emergency Medicine

## 2014-12-30 ENCOUNTER — Emergency Department (HOSPITAL_BASED_OUTPATIENT_CLINIC_OR_DEPARTMENT_OTHER): Payer: BLUE CROSS/BLUE SHIELD

## 2014-12-30 ENCOUNTER — Telehealth: Payer: Self-pay | Admitting: Cardiovascular Disease

## 2014-12-30 DIAGNOSIS — Z8739 Personal history of other diseases of the musculoskeletal system and connective tissue: Secondary | ICD-10-CM | POA: Diagnosis not present

## 2014-12-30 DIAGNOSIS — F419 Anxiety disorder, unspecified: Secondary | ICD-10-CM | POA: Insufficient documentation

## 2014-12-30 DIAGNOSIS — E785 Hyperlipidemia, unspecified: Secondary | ICD-10-CM | POA: Diagnosis not present

## 2014-12-30 DIAGNOSIS — R079 Chest pain, unspecified: Secondary | ICD-10-CM | POA: Insufficient documentation

## 2014-12-30 DIAGNOSIS — E039 Hypothyroidism, unspecified: Secondary | ICD-10-CM | POA: Diagnosis not present

## 2014-12-30 DIAGNOSIS — Z79899 Other long term (current) drug therapy: Secondary | ICD-10-CM | POA: Insufficient documentation

## 2014-12-30 DIAGNOSIS — R251 Tremor, unspecified: Secondary | ICD-10-CM | POA: Diagnosis not present

## 2014-12-30 DIAGNOSIS — R110 Nausea: Secondary | ICD-10-CM | POA: Diagnosis not present

## 2014-12-30 DIAGNOSIS — Z7951 Long term (current) use of inhaled steroids: Secondary | ICD-10-CM | POA: Diagnosis not present

## 2014-12-30 DIAGNOSIS — Z8719 Personal history of other diseases of the digestive system: Secondary | ICD-10-CM | POA: Insufficient documentation

## 2014-12-30 DIAGNOSIS — R002 Palpitations: Secondary | ICD-10-CM | POA: Insufficient documentation

## 2014-12-30 DIAGNOSIS — R42 Dizziness and giddiness: Secondary | ICD-10-CM | POA: Diagnosis not present

## 2014-12-30 DIAGNOSIS — I10 Essential (primary) hypertension: Secondary | ICD-10-CM | POA: Insufficient documentation

## 2014-12-30 DIAGNOSIS — R0789 Other chest pain: Secondary | ICD-10-CM

## 2014-12-30 LAB — URINALYSIS, ROUTINE W REFLEX MICROSCOPIC
Bilirubin Urine: NEGATIVE
GLUCOSE, UA: NEGATIVE mg/dL
Hgb urine dipstick: NEGATIVE
KETONES UR: NEGATIVE mg/dL
NITRITE: NEGATIVE
PH: 5.5 (ref 5.0–8.0)
Protein, ur: NEGATIVE mg/dL
Specific Gravity, Urine: 1.02 (ref 1.005–1.030)

## 2014-12-30 LAB — CBC
HEMATOCRIT: 49.4 % — AB (ref 36.0–46.0)
HEMOGLOBIN: 16.5 g/dL — AB (ref 12.0–15.0)
MCH: 29.1 pg (ref 26.0–34.0)
MCHC: 33.4 g/dL (ref 30.0–36.0)
MCV: 87.1 fL (ref 78.0–100.0)
Platelets: 367 10*3/uL (ref 150–400)
RBC: 5.67 MIL/uL — AB (ref 3.87–5.11)
RDW: 13.1 % (ref 11.5–15.5)
WBC: 10.2 10*3/uL (ref 4.0–10.5)

## 2014-12-30 LAB — BASIC METABOLIC PANEL
Anion gap: 9 (ref 5–15)
BUN: 18 mg/dL (ref 6–20)
CO2: 28 mmol/L (ref 22–32)
CREATININE: 0.97 mg/dL (ref 0.44–1.00)
Calcium: 9.3 mg/dL (ref 8.9–10.3)
Chloride: 101 mmol/L (ref 101–111)
GFR calc Af Amer: >60 mL/min (ref >60)
GLUCOSE: 103 mg/dL — AB (ref 65–99)
Potassium: 3.9 mmol/L (ref 3.5–5.1)
SODIUM: 138 mmol/L (ref 135–145)

## 2014-12-30 LAB — URINE MICROSCOPIC-ADD ON

## 2014-12-30 LAB — TROPONIN I

## 2014-12-30 LAB — MAGNESIUM: Magnesium: 2.1 mg/dL (ref 1.7–2.4)

## 2014-12-30 MED ORDER — ASPIRIN 81 MG PO CHEW
324.0000 mg | CHEWABLE_TABLET | Freq: Once | ORAL | Status: AC
  Administered 2014-12-30: 324 mg via ORAL
  Filled 2014-12-30: qty 4

## 2014-12-30 MED ORDER — SODIUM CHLORIDE 0.9 % IV SOLN
1000.0000 mL | Freq: Once | INTRAVENOUS | Status: AC
  Administered 2014-12-30: 1000 mL via INTRAVENOUS

## 2014-12-30 MED ORDER — SODIUM CHLORIDE 0.9 % IV SOLN
1000.0000 mL | INTRAVENOUS | Status: DC
  Administered 2014-12-30: 1000 mL via INTRAVENOUS

## 2014-12-30 NOTE — Telephone Encounter (Signed)
Pt called in stating that for the past 3 days she been having an "anxious feeling" along with some tightness in her chest and a 120bpm heart rate. Please f/u with pt  Thanks

## 2014-12-30 NOTE — Telephone Encounter (Signed)
Agree with advice, suggest [facility]

## 2014-12-30 NOTE — ED Provider Notes (Signed)
CSN: [id number]     Arrival date & time 12/30/14  1244 History   First MD Initiated Contact with Patient 12/30/14 1248     No chief complaint on file.    (Consider location/radiation/quality/duration/timing/severity/associated sxs/prior Treatment) HPI   [name] [name] is a 59 y.o. female with PMH significant for HTN, HLD, hypothyroidism, cholelithiasis, arthritis who presents with 2-3 day history of gradual onset, constant, mild substernal chest tightness.  Associated symptoms include feeling anxious, dizziness, palpitations, nausea, and feeling shaky.  Denies SOB, cough, LOC, syncope, vomiting, or abdominal pain.  She reports she took her propranolol this AM which helped with her rapid HR.  She does report she is under a lot of stress at work.  Denies smoking hx.  She does report a history of "rapid heartbeat" for which she started taking the propranolol.   Past Medical History  Diagnosis Date  . Hyperlipemia   . Hypertension   . Hypothyroidism   . Cholelithiasis   . Hepatic steatosis   . PONV (postoperative nausea and vomiting)   . Arthritis    Past Surgical History  Procedure Laterality Date  . Abdominal hysterectomy      @ age 82, due to fibroids   . Tonsillectomy    . Mohs surgery  2004    Basal Cell   . Colonoscopy w/ polypectomy  2010     Dr.Jacobs, due 2015   . Dilation and curettage of uterus    . Total knee arthroplasty  09/22/2011    Dr Nicholes Stairs, Left;   Family History  Problem Relation Age of Onset  . Diabetes Mother   . Leukemia Mother   . Alcohol abuse Father   . Lung cancer Father     smoker  . Diabetes Brother   . Heart attack Maternal Uncle     X3 ; MIs in 43s & 55s  . Stroke Maternal Uncle     in his 37s   Social History  Substance Use Topics  . Smoking status: Never Smoker   . Smokeless tobacco: None  . Alcohol Use: Yes     Comment: occasional   OB History    No data available     Review of Systems All other systems negative unless  otherwise stated in HPI    Allergies  Ampicillin; Oxycodone; and Doxycycline  Home Medications   Prior to Admission medications   Medication Sig Start Date End Date Taking? Authorizing Provider  CRESTOR 20 MG tablet TAKE 1 TABLET BY MOUTH DAILY 07/30/12  Yes Hendricks Limes, MD  guaiFENesin (MUCINEX) 600 MG 12 hr tablet Take 600 mg by mouth daily.   Yes Historical Provider, MD  hydrochlorothiazide (HYDRODIURIL) 25 MG tablet Take 12.5 mg by mouth daily.    Yes Historical Provider, MD  levothyroxine (SYNTHROID, LEVOTHROID) 100 MCG tablet Take 100 mcg by mouth daily.    Yes Historical Provider, MD  losartan (COZAAR) 100 MG tablet TAKE 1 TABLET DAILY 07/30/12  Yes Hendricks Limes, MD  mometasone (NASONEX) 50 MCG/ACT nasal spray Place 2 sprays into the nose daily.   Yes Historical Provider, MD  propranolol (INDERAL) 10 MG tablet 1 every 8 hrs prn 07/06/14  Yes Mihai Croitoru, MD  sertraline (ZOLOFT) 50 MG tablet Take 50 mg by mouth daily. 10/17/11  Yes Hendricks Limes, MD   BP 119/69 mmHg  Pulse 75  Temp(Src) 98.2 F (36.8 C) (Oral)  Resp 13  Ht 5\' 6"  (1.676 m)  Wt 104.327 kg  BMI 37.14 kg/m2  SpO2 97% Physical Exam  Constitutional: She is oriented to person, place, and time. She appears well-developed and well-nourished.  HENT:  Head: Normocephalic and atraumatic.  Mouth/Throat: Oropharynx is clear and moist.  Eyes: Conjunctivae are normal. Pupils are equal, round, and reactive to light.  Neck: Normal range of motion. Neck supple.  Cardiovascular: Normal rate, regular rhythm and normal heart sounds.   No murmur heard. Pulmonary/Chest: Effort normal and breath sounds normal. No accessory muscle usage or stridor. No respiratory distress. She has no wheezes. She has no rhonchi. She has no rales.  Abdominal: Soft. Bowel sounds are normal. She exhibits no distension. There is no tenderness.  Musculoskeletal: Normal range of motion.  Lymphadenopathy:    She has no cervical adenopathy.   Neurological: She is alert and oriented to person, place, and time.  Speech clear without dysarthria.  Skin: Skin is warm and dry.  Psychiatric: She has a normal mood and affect. Her behavior is normal.    ED Course  Procedures (including critical care time) Labs Review Labs Reviewed  BASIC METABOLIC PANEL - Abnormal; Notable for the following:    Glucose, Bld 103 (*)    All other components within normal limits  URINALYSIS, ROUTINE W REFLEX MICROSCOPIC (NOT AT [facility]) - Abnormal; Notable for the following:    Leukocytes, UA TRACE (*)    All other components within normal limits  CBC - Abnormal; Notable for the following:    RBC 5.67 (*)    Hemoglobin 16.5 (*)    HCT 49.4 (*)    All other components within normal limits  URINE MICROSCOPIC-ADD ON - Abnormal; Notable for the following:    Squamous Epithelial / LPF 0-5 (*)    Bacteria, UA RARE (*)    All other components within normal limits  MAGNESIUM  TROPONIN I    Imaging Review Dg Chest 2 View  12/30/2014  CLINICAL DATA:  Chest pain EXAM: CHEST  2 VIEW COMPARISON:  09/19/2011 FINDINGS: Normal heart size and mediastinal contours. No acute infiltrate or edema. No effusion or pneumothorax. No acute osseous findings. IMPRESSION: Negative chest. Electronically Signed   By: Monte Fantasia M.D.   On: 12/30/2014 13:24   I have personally reviewed and evaluated these images and lab results as part of my medical decision-making.   EKG Interpretation None     ED ECG REPORT   Date: 12/30/2014  Rate: 72   Rhythm: normal sinus rhythm  QRS Axis: left  Intervals: normal  ST/T Wave abnormalities: nonspecific T wave changes in V2.  Conduction Disutrbances:none  Narrative Interpretation:   Old EKG Reviewed: unchanged  I have personally reviewed the EKG tracing and agree with the computerized printout as noted.  MDM   Final diagnoses:  Chest tightness    Patient presents with chest tightness for the past 2-3 days.  Assoc sxs  of feeling anxious, dizziness, palpitations, and nausea.  No fevers or recent illness.  VSS, NAD.  On exam, heart RRR, lungs CTAB, abdomen soft and benign.  Will obtain labs and give ASA 324.  HEART score 3.  Suspect anxiety component.  Doubt cardiac etiology, metabolic derrangement, or infectious etiology. EKG NSR, shows no acute changes, nonspecific T wave abnormalities in V2.  Troponin <0.03.  CXR negative.  Remaining labs unremarkable.   Evaluation does not show pathology requring ongoing emergent intervention or admission. Pt is hemodynamically stable and mentating appropriately. Discussed findings/results and plan with patient/guardian, who agrees with plan. All questions  answered. Return precautions discussed and outpatient follow up given.   Case has been discussed with Dr. Maryan Rued who agrees with the above plan for discharge.     Gloriann Loan, PA-C 12/30/14 1432  Blanchie Dessert, MD 01/02/15 [phone]

## 2014-12-30 NOTE — Telephone Encounter (Signed)
Returned call to patient.She stated for the past 2 days she has been having off and on chest tightness,fast heart beat,dizziness.Stated she just don't feel right.Stated she is having slight chest tightness at present rates tightness # 3.Stated she would like to be seen today. Advised she needs to go to ER.Stated she lives in [place] and she will go to Urgent Care or ER.

## 2014-12-30 NOTE — ED Notes (Signed)
C/o chest tightness x 3 days. Rapid hrt rate of 120/m. Has seen MD for fast Hrt rate in past. No sob. C/o nausea and shakiness.

## 2014-12-30 NOTE — Discharge Instructions (Signed)
Nonspecific Chest Pain  °Chest pain can be caused by many different conditions. There is always a chance that your pain could be related to something serious, such as a heart attack or a blood clot in your lungs. Chest pain can also be caused by conditions that are not life-threatening. If you have chest pain, it is very important to follow up with your health care provider. °CAUSES  °Chest pain can be caused by: °· Heartburn. °· Pneumonia or bronchitis. °· Anxiety or stress. °· Inflammation around your heart (pericarditis) or lung (pleuritis or pleurisy). °· A blood clot in your lung. °· A collapsed lung (pneumothorax). It can develop suddenly on its own (spontaneous pneumothorax) or from trauma to the chest. °· Shingles infection (varicella-zoster virus). °· Heart attack. °· Damage to the bones, muscles, and cartilage that make up your chest wall. This can include: °¨ Bruised bones due to injury. °¨ Strained muscles or cartilage due to frequent or repeated coughing or overwork. °¨ Fracture to one or more ribs. °¨ Sore cartilage due to inflammation (costochondritis). °RISK FACTORS  °Risk factors for chest pain may include: °· Activities that increase your risk for trauma or injury to your chest. °· Respiratory infections or conditions that cause frequent coughing. °· Medical conditions or overeating that can cause heartburn. °· Heart disease or family history of heart disease. °· Conditions or health behaviors that increase your risk of developing a blood clot. °· Having had chicken pox (varicella zoster). °SIGNS AND SYMPTOMS °Chest pain can feel like: °· Burning or tingling on the surface of your chest or deep in your chest. °· Crushing, pressure, aching, or squeezing pain. °· Dull or sharp pain that is worse when you move, cough, or take a deep breath. °· Pain that is also felt in your back, neck, shoulder, or arm, or pain that spreads to any of these areas. °Your chest pain may come and go, or it may stay  constant. °DIAGNOSIS °Lab tests or other studies may be needed to find the cause of your pain. Your health care provider may have you take a test called an ambulatory ECG (electrocardiogram). An ECG records your heartbeat patterns at the time the test is performed. You may also have other tests, such as: °· Transthoracic echocardiogram (TTE). During echocardiography, sound waves are used to create a picture of all of the heart structures and to look at how blood flows through your heart. °· Transesophageal echocardiogram (TEE). This is a more advanced imaging test that obtains images from inside your body. It allows your health care provider to see your heart in finer detail. °· Cardiac monitoring. This allows your health care provider to monitor your heart rate and rhythm in real time. °· Holter monitor. This is a portable device that records your heartbeat and can help to diagnose abnormal heartbeats. It allows your health care provider to track your heart activity for several days, if needed. °· Stress tests. These can be done through exercise or by taking medicine that makes your heart beat more quickly. °· Blood tests. °· Imaging tests. °TREATMENT  °Your treatment depends on what is causing your chest pain. Treatment may include: °· Medicines. These may include: °¨ Acid blockers for heartburn. °¨ Anti-inflammatory medicine. °¨ Pain medicine for inflammatory conditions. °¨ Antibiotic medicine, if an infection is present. °¨ Medicines to dissolve blood clots. °¨ Medicines to treat coronary artery disease. °· Supportive care for conditions that do not require medicines. This may include: °¨ Resting. °¨ Applying heat   or cold packs to injured areas. °¨ Limiting activities until pain decreases. °HOME CARE INSTRUCTIONS °· If you were prescribed an antibiotic medicine, finish it all even if you start to feel better. °· Avoid any activities that bring on chest pain. °· Do not use any tobacco products, including  cigarettes, chewing tobacco, or electronic cigarettes. If you need help quitting, ask your health care provider. °· Do not drink alcohol. °· Take medicines only as directed by your health care provider. °· Keep all follow-up visits as directed by your health care provider. This is important. This includes any further testing if your chest pain does not go away. °· If heartburn is the cause for your chest pain, you may be told to keep your head raised (elevated) while sleeping. This reduces the chance that acid will go from your stomach into your esophagus. °· Make lifestyle changes as directed by your health care provider. These may include: °¨ Getting regular exercise. Ask your health care provider to suggest some activities that are safe for you. °¨ Eating a heart-healthy diet. A registered dietitian can help you to learn healthy eating options. °¨ Maintaining a healthy weight. °¨ Managing diabetes, if necessary. °¨ Reducing stress. °SEEK MEDICAL CARE IF: °· Your chest pain does not go away after treatment. °· You have a rash with blisters on your chest. °· You have a fever. °SEEK IMMEDIATE MEDICAL CARE IF:  °· Your chest pain is worse. °· You have an increasing cough, or you cough up blood. °· You have severe abdominal pain. °· You have severe weakness. °· You faint. °· You have chills. °· You have sudden, unexplained chest discomfort. °· You have sudden, unexplained discomfort in your arms, back, neck, or jaw. °· You have shortness of breath at any time. °· You suddenly start to sweat, or your skin gets clammy. °· You feel nauseous or you vomit. °· You suddenly feel light-headed or dizzy. °· Your heart begins to beat quickly, or it feels like it is skipping beats. °These symptoms may represent a serious problem that is an emergency. Do not wait to see if the symptoms will go away. Get medical help right away. Call your local emergency services (911 in the [place] Do not drive yourself to the hospital. °  °This  information is not intended to replace advice given to you by your health care provider. Make sure you discuss any questions you have with your health care provider. °  °Document Released: 10/19/2004 Document Revised: 01/30/2014 Document Reviewed: 08/15/2013 °Elsevier Interactive Patient Education ©2016 [employer] ° °

## 2015-01-04 ENCOUNTER — Telehealth: Payer: Self-pay | Admitting: Internal Medicine

## 2015-01-04 MED ORDER — LORAZEPAM 0.5 MG PO TABS: 0.5000 mg | ORAL_TABLET | Freq: Three times a day (TID) | ORAL | Status: DC | PRN

## 2015-01-04 NOTE — Telephone Encounter (Signed)
lorazepam 0.5 mg q 8 hrs prn #20 May need to call it in  locally & have it transferred to [facility] pharmacy

## 2015-01-04 NOTE — Telephone Encounter (Signed)
Rx called into pharmacist Izora Gala), pt advised

## 2015-01-04 NOTE — Telephone Encounter (Signed)
Patient is at [facility] and she is having some really bad anxiety. She already went to the ER last week with the symptoms and they told her it was anxiety. She doesn't want to go back to ER cause she knows what it is. She says it's work related. She is hoping you could call in something for her till she comes in to see you next week. Pharmacy is [facility].

## 2015-01-11 ENCOUNTER — Ambulatory Visit: Payer: BLUE CROSS/BLUE SHIELD | Admitting: Internal Medicine

## 2015-01-13 ENCOUNTER — Ambulatory Visit (INDEPENDENT_AMBULATORY_CARE_PROVIDER_SITE_OTHER): Payer: BLUE CROSS/BLUE SHIELD | Admitting: Internal Medicine

## 2015-01-13 ENCOUNTER — Encounter: Payer: Self-pay | Admitting: Internal Medicine

## 2015-01-13 ENCOUNTER — Other Ambulatory Visit (INDEPENDENT_AMBULATORY_CARE_PROVIDER_SITE_OTHER): Payer: BLUE CROSS/BLUE SHIELD

## 2015-01-13 VITALS — BP 126/84 | HR 90 | Temp 98.8°F | Resp 20 | Ht 66.0 in | Wt 231.8 lb

## 2015-01-13 DIAGNOSIS — F411 Generalized anxiety disorder: Secondary | ICD-10-CM

## 2015-01-13 DIAGNOSIS — I479 Paroxysmal tachycardia, unspecified: Secondary | ICD-10-CM

## 2015-01-13 DIAGNOSIS — L659 Nonscarring hair loss, unspecified: Secondary | ICD-10-CM

## 2015-01-13 LAB — T4, FREE: Free T4: 0.83 ng/dL (ref 0.60–1.60)

## 2015-01-13 LAB — TSH: TSH: 2.62 u[IU]/mL (ref 0.35–4.50)

## 2015-01-13 LAB — T3, FREE: T3 FREE: 3.3 pg/mL (ref 2.3–4.2)

## 2015-01-13 MED ORDER — LORAZEPAM 0.5 MG PO TABS: 0.5000 mg | ORAL_TABLET | Freq: Three times a day (TID) | ORAL | Status: DC | PRN

## 2015-01-13 NOTE — Progress Notes (Signed)
   Subjective:    Patient ID: [name] [name], female    DOB: [birth year], 59 y.o.   MRN: [id number]  HPI On 12/30/14 she was seen in emergency room for a 2-3 day history of constant, mild substernal chest tightness. She also felt anxious, nauseated, and noted palpitations. She took propranolol which helped with the tachycardia but the anxiety  persisted. She described being under increased stress at work. The ER records and labs were reviewed. Chemistries were normal except for mildly elevated glucose of 103. Hemoconcentration was suggested by hemoglobin of 16.5 and hematocrit 49.4. Troponin was less than 0.03. The last thyroid function test on record was a TSH of 2.04 in February of this year.  Lorazepam taken 1-2 times per day has been of some benefit. She has continued the sertraline.  There is no family history of anxiety, depression, schizophrenia, drug abuse, or manic depression. She states her father did drink to excess.   Review of Systems  She describes paroxysmal tachycardia and blood pressure variability. She's had some headache and ongoing nausea intermittently. Stools were been very loose but not diarrheal. She does describe decreased appetite. She also has excessive somnolence. She did have difficulty triaging & concentrating at work. She admits to some slight depression. There is no suicidal ideation.  For hair loss iron and vitamin D 3 were prescribed by her dermatologist.      Objective:   Physical Exam Pertinent or positive findings include: She is intermittently tearful. She is appropriate and oriented.  General appearance :adequately nourished; in no distress.  Eyes: No conjunctival inflammation or scleral icterus is present.   Heart:  Normal rate and regular rhythm. S1 and S2 normal without gallop, murmur, click, rub or other extra sounds    Lungs:Chest clear to auscultation; no wheezes, rhonchi,rales ,or rubs present.No increased work of breathing.   Abdomen:  bowel sounds normal, soft and non-tender without masses, organomegaly or hernias noted.  No guarding or rebound.   Vascular : all pulses equal ; no bruits present.  Skin:Warm & dry.  Intact without suspicious lesions or rashes ; no tenting or jaundice   Lymphatic: No lymphadenopathy is noted about the head, neck, axilla.   Neuro: Strength, tone & DTRs normal.     Assessment & Plan:   #1 paroxysmal tachycardia  #2 hair loss  #3 exogenous stress with anxiety  Plan: Thyroid dysfunction or pheochromocytoma need to be  ruled out. Options to treat the stress were discussed. These would include regular exercise, counseling, and spirituality.

## 2015-01-13 NOTE — Patient Instructions (Signed)
  I recommend you have testing done for excess stress hormone production.This requires all urine be collected for 24 hours. I recommend this special test because of your intermittent fast heart rate An  A1c of 8% or less  is the safest goal for you; it is critical to prevent hypoglycemia. Recheck A1c in 3-4 months. full thyroid function tests (free T4,freeT3 & TSH) will also be checked.

## 2015-01-13 NOTE — Progress Notes (Signed)
Pre visit review using our clinic review tool, if applicable. No additional management support is needed unless otherwise documented below in the visit note. 

## 2015-01-19 ENCOUNTER — Other Ambulatory Visit: Payer: BLUE CROSS/BLUE SHIELD

## 2015-01-26 LAB — CATECHOLAMINES, FRACTIONATED, URINE, 24 HOUR
CREATININE, URINE MG/DAY-CATEUR: 0.46 g/(24.h) — AB (ref 0.63–2.50)
Calculated Total (E+NE): 8 mcg/24 h — ABNORMAL LOW (ref 26–121)
DOPAMINE, 24 HR URINE: 46 ug/(24.h) — AB (ref 52–480)
NOREPINEPHRINE, 24 HR UR: 8 ug/(24.h) — AB (ref 15–100)
Total Volume - CF 24Hr U: 1000 mL

## 2015-01-27 LAB — METANEPHRINES, URINE, 24 HOUR
METANEPHRINES UR: 18 ug/(24.h) — AB (ref 90–315)
Metaneph Total, Ur: 69 mcg/24 h — ABNORMAL LOW (ref 224–832)
NORMETANEPHRINE 24H UR: 51 ug/(24.h) — AB (ref 122–676)

## 2015-02-04 ENCOUNTER — Telehealth: Payer: Self-pay | Admitting: Cardiovascular Disease

## 2015-02-04 NOTE — Telephone Encounter (Signed)
Pt experiencing palpitations/PVCs similar to those she has had in the past. She has been instructed to f/u w/ [place] on as needed basis. Dr. Linna Darner doses her propanolol. When last seen by [place], Dr. Sallyanne Kuster affirmed it was OK for her to take this medication 3 times a day as needed for control of her symptoms. She has taken a single dose this morning. This relieved symptoms, however, she became worried about what to do if symptoms returned.  Advised OK to take med up to 3 times if it is required w/in 24 hrs - she is welcome to call for any concerns and call if she feels she is needing more medication to control this. She voiced understanding - asked when Dr. Loletha Grayer recommended she return - Advised that Dr. Loletha Grayer recommended to follow up as needed - I would be happy to schedule her if she feels she needs to be seen. She voiced understanding, states she will wait about a week to see how her symptoms are, and call if needed.  She knows that she should be attentive to BP & HR when taking medication doses outside her usual regimen. She knows to seek ER evaluation if she has a rapid HR that does not improve w/ medication and/or other symptoms associated w/ this (i.e. CP, SOB). Pt voiced understanding and agreement, thanks.

## 2015-02-04 NOTE — Telephone Encounter (Signed)
Message.

## 2015-02-04 NOTE — Telephone Encounter (Signed)
Agree with advice. We frequently dose this medication three times daily. If symptoms persist, it may be worthwhile looking for a cause such as electrolyte abnormalities, respiratory illness, stimulants such as decongestants, caffeine, etc.

## 2015-03-15 ENCOUNTER — Ambulatory Visit (INDEPENDENT_AMBULATORY_CARE_PROVIDER_SITE_OTHER): Payer: BLUE CROSS/BLUE SHIELD

## 2015-03-15 DIAGNOSIS — Z23 Encounter for immunization: Secondary | ICD-10-CM

## 2015-03-19 ENCOUNTER — Encounter: Payer: Self-pay | Admitting: Nurse Practitioner

## 2015-03-19 ENCOUNTER — Ambulatory Visit (INDEPENDENT_AMBULATORY_CARE_PROVIDER_SITE_OTHER): Payer: BLUE CROSS/BLUE SHIELD | Admitting: Nurse Practitioner

## 2015-03-19 VITALS — BP 124/72 | HR 94 | Wt 225.0 lb

## 2015-03-19 DIAGNOSIS — F411 Generalized anxiety disorder: Secondary | ICD-10-CM

## 2015-03-19 MED ORDER — LORAZEPAM 0.5 MG PO TABS: 0.5000 mg | ORAL_TABLET | Freq: Three times a day (TID) | ORAL | Status: DC | PRN

## 2015-03-19 NOTE — Patient Instructions (Signed)
Generalized Anxiety Disorder Generalized anxiety disorder (GAD) is a mental disorder. It interferes with life functions, including relationships, work, and school. GAD is different from normal anxiety, which everyone experiences at some point in their lives in response to specific life events and activities. Normal anxiety actually helps us prepare for and get through these life events and activities. Normal anxiety goes away after the event or activity is over.  GAD causes anxiety that is not necessarily related to specific events or activities. It also causes excess anxiety in proportion to specific events or activities. The anxiety associated with GAD is also difficult to control. GAD can vary from mild to severe. People with severe GAD can have intense waves of anxiety with physical symptoms (panic attacks).  SYMPTOMS The anxiety and worry associated with GAD are difficult to control. This anxiety and worry are related to many life events and activities and also occur more days than not for 6 months or longer. People with GAD also have three or more of the following symptoms (one or more in children):  Restlessness.   Fatigue.  Difficulty concentrating.   Irritability.  Muscle tension.  Difficulty sleeping or unsatisfying sleep. DIAGNOSIS GAD is diagnosed through an assessment by your health care provider. Your health care provider will ask you questions aboutyour mood,physical symptoms, and events in your life. Your health care provider may ask you about your medical history and use of alcohol or drugs, including prescription medicines. Your health care provider may also do a physical exam and blood tests. Certain medical conditions and the use of certain substances can cause symptoms similar to those associated with GAD. Your health care provider may refer you to a mental health specialist for further evaluation. TREATMENT The following therapies are usually used to treat GAD:    Medication. Antidepressant medication usually is prescribed for long-term daily control. Antianxiety medicines may be added in severe cases, especially when panic attacks occur.   Talk therapy (psychotherapy). Certain types of talk therapy can be helpful in treating GAD by providing support, education, and guidance. A form of talk therapy called cognitive behavioral therapy can teach you healthy ways to think about and react to daily life events and activities.  Stress managementtechniques. These include yoga, meditation, and exercise and can be very helpful when they are practiced regularly. A mental health specialist can help determine which treatment is best for you. Some people see improvement with one therapy. However, other people require a combination of therapies.   This information is not intended to replace advice given to you by your health care provider. Make sure you discuss any questions you have with your health care provider.   Document Released: 05/06/2012 Document Revised: 01/30/2014 Document Reviewed: 05/06/2012 Elsevier Interactive Patient Education 2016 [employer]  

## 2015-03-19 NOTE — Progress Notes (Signed)
Patient ID: [name] [name], female    DOB: 1955-11-21  Age: 60 y.o. MRN: [id number]  CC: Anxiety; Tachycardia; and Headache   HPI [name] [name] presents for anxiety with panic attacks.  1) 01/13/15 Saw Dr. Linna Darner for anxiety  Lorazepam taken 1-2 x daily has been helpful   12/29/14 seen in ED for chest tightness that was related to anxiety  Workup included EKG that showed NSR Normal troponin Chest x-ray was negative as well as labs  Today: Visibly shaking and working herself up She reports nausea and vomiting when she thinks about having to go to work. 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   She has past surgical history that includes Abdominal hysterectomy; Tonsillectomy; Mohs surgery (2004); Colonoscopy w/ polypectomy (2010 ); Dilation and curettage of uterus; and Total knee arthroplasty (09/22/2011).   Her family history includes Alcohol abuse in her father; Diabetes in her brother and mother; Heart attack in her maternal uncle; Leukemia in her mother; Lung cancer in her father; Stroke in her maternal uncle.She reports that she has never smoked. She does not have any smokeless tobacco history on file. She reports that she drinks alcohol. She reports that she does not use illicit drugs.  Outpatient Prescriptions Prior to Visit  Medication Sig Dispense Refill  . CRESTOR 20 MG tablet TAKE 1 TABLET BY MOUTH DAILY 90 tablet 3  . guaiFENesin (MUCINEX) 600 MG 12 hr tablet Take 600 mg by mouth daily.    . hydrochlorothiazide (HYDRODIURIL) 25 MG tablet Take 12.5 mg by mouth daily.     Marland Kitchen levothyroxine (SYNTHROID, LEVOTHROID) 100 MCG tablet Take 100 mcg by mouth daily.     Marland Kitchen losartan (COZAAR) 100  MG tablet TAKE 1 TABLET DAILY 90 tablet 3  . mometasone (NASONEX) 50 MCG/ACT nasal spray Place 2 sprays into the nose daily.    . propranolol (INDERAL) 10 MG tablet 1 every 8 hrs prn 30 tablet 5  . sertraline (ZOLOFT) 50 MG tablet Take 50 mg by mouth daily.    Marland Kitchen LORazepam (ATIVAN) 0.5 MG tablet Take 1 tablet (0.5 mg total) by mouth every 8 (eight) hours as needed for anxiety. 30 tablet 0   No facility-administered medications prior to visit.    ROS Review of Systems  Constitutional: Positive for fatigue. Negative for fever, chills and diaphoresis.  Respiratory: Negative for chest tightness, shortness of breath and wheezing.   Cardiovascular: Negative for chest pain, palpitations and leg swelling.  Gastrointestinal: Positive for nausea and vomiting. Negative for diarrhea.  Skin: Negative for rash.  Neurological: Negative for dizziness and headaches.  Psychiatric/Behavioral: Positive for sleep disturbance. Negative for suicidal ideas. The patient is nervous/anxious.     Objective:  BP 124/72 mmHg  Pulse 94  Wt 225 lb (102.059 kg)  SpO2 95%  Physical Exam  Constitutional: She is oriented to person, place, and time. She appears well-developed and well-nourished. No distress.  HENT:  Head: Normocephalic and atraumatic.  Right Ear: External ear normal.  Left Ear: External ear normal.  Cardiovascular: Normal rate, regular rhythm and normal heart sounds.  Exam reveals no gallop and no friction rub.   No murmur heard. Pulmonary/Chest: Effort normal and breath sounds normal. No respiratory distress. She has no wheezes. She has no rales. She  exhibits no tenderness.  Neurological: She is alert and oriented to person, place, and time. Coordination normal.  Skin: Skin is warm and dry. No rash noted. She is not diaphoretic.  Psychiatric: Her speech is normal and behavior is normal. Judgment and thought content normal. Her mood appears anxious. Her affect is not angry, not blunt, not labile and  not inappropriate. She does not exhibit a depressed mood.  Patient is vigorously shaking her legs while discussing her anxiety today   Assessment & Plan:   Emina was seen today for anxiety, tachycardia and headache.  Diagnoses and all orders for this visit:  Anxiety state -     LORazepam (ATIVAN) 0.5 MG tablet; Take 1 tablet (0.5 mg total) by mouth every 8 (eight) hours as needed for anxiety.   I am having Ms. Celestine maintain her hydrochlorothiazide, levothyroxine, sertraline, CRESTOR, losartan, guaiFENesin, mometasone, propranolol, and LORazepam.  Meds ordered this encounter  Medications  . LORazepam (ATIVAN) 0.5 MG tablet    Sig: Take 1 tablet (0.5 mg total) by mouth every 8 (eight) hours as needed for anxiety.    Dispense:  30 tablet    Refill:  0    Order Specific Question:  Supervising Provider    Answer:  Crecencio Mc [[id number]]     Follow-up: Return if symptoms worsen or fail to improve.

## 2015-03-19 NOTE — Progress Notes (Signed)
Pre visit review using our clinic review tool, if applicable. No additional management support is needed unless otherwise documented below in the visit note. 

## 2015-03-28 DIAGNOSIS — F411 Generalized anxiety disorder: Secondary | ICD-10-CM | POA: Insufficient documentation

## 2015-03-28 HISTORY — DX: Generalized anxiety disorder: F41.1

## 2015-03-28 NOTE — Assessment & Plan Note (Signed)
Established problem worsening Refill of lorazepam was given to patient to take to pharmacy Patient is considering FMLA for the remainder of her time at her job Advised close follow-up with PCP Denies suicidal ideations or homicidal ideations Patient visibly anxious in office today Note given for her to remain out of work for 1 week Patient is aware that she will have to probably use her own sick time No guarantees were made for [facility] approval by her HR dept for this condition. Patient verbalized understanding

## 2015-04-13 ENCOUNTER — Telehealth: Payer: Self-pay | Admitting: Internal Medicine

## 2015-04-13 MED ORDER — LOSARTAN POTASSIUM 100 MG PO TABS: 100.0000 mg | ORAL_TABLET | Freq: Every day | ORAL | Status: DC

## 2015-04-13 NOTE — Telephone Encounter (Signed)
Pt request refill for losartan (COZAAR) 100 MG tablet to be send into [place]. Please help, she has an appt with Dr. Quay Burow 04/2015

## 2015-05-17 ENCOUNTER — Encounter: Payer: BLUE CROSS/BLUE SHIELD | Admitting: Internal Medicine

## 2015-05-31 ENCOUNTER — Other Ambulatory Visit: Payer: Self-pay | Admitting: *Deleted

## 2015-05-31 MED ORDER — ROSUVASTATIN CALCIUM 20 MG PO TABS: 20.0000 mg | ORAL_TABLET | Freq: Every day | ORAL | Status: DC

## 2015-05-31 NOTE — Telephone Encounter (Signed)
Left msg on triage stating she is needing refill on her crestor. Pt has made appt to see Dr. Quay Burow in June former pt of Dr. Linna Darner. Sent refill to [facility]...Johny Chess

## 2015-06-17 ENCOUNTER — Ambulatory Visit (INDEPENDENT_AMBULATORY_CARE_PROVIDER_SITE_OTHER): Payer: PRIVATE HEALTH INSURANCE | Admitting: Internal Medicine

## 2015-06-17 ENCOUNTER — Encounter: Payer: Self-pay | Admitting: Internal Medicine

## 2015-06-17 VITALS — BP 126/74 | HR 85 | Temp 98.5°F | Resp 16 | Wt 226.0 lb

## 2015-06-17 DIAGNOSIS — M5020 Other cervical disc displacement, unspecified cervical region: Secondary | ICD-10-CM | POA: Insufficient documentation

## 2015-06-17 DIAGNOSIS — R002 Palpitations: Secondary | ICD-10-CM | POA: Diagnosis not present

## 2015-06-17 DIAGNOSIS — E119 Type 2 diabetes mellitus without complications: Secondary | ICD-10-CM

## 2015-06-17 DIAGNOSIS — I10 Essential (primary) hypertension: Secondary | ICD-10-CM

## 2015-06-17 DIAGNOSIS — E038 Other specified hypothyroidism: Secondary | ICD-10-CM

## 2015-06-17 DIAGNOSIS — Z85828 Personal history of other malignant neoplasm of skin: Secondary | ICD-10-CM | POA: Diagnosis not present

## 2015-06-17 DIAGNOSIS — Z0000 Encounter for general adult medical examination without abnormal findings: Secondary | ICD-10-CM

## 2015-06-17 DIAGNOSIS — R6889 Other general symptoms and signs: Secondary | ICD-10-CM

## 2015-06-17 DIAGNOSIS — Z0001 Encounter for general adult medical examination with abnormal findings: Secondary | ICD-10-CM | POA: Diagnosis not present

## 2015-06-17 DIAGNOSIS — R7303 Prediabetes: Secondary | ICD-10-CM

## 2015-06-17 HISTORY — DX: Other cervical disc displacement, unspecified cervical region: M50.20

## 2015-06-17 HISTORY — DX: Palpitations: R00.2

## 2015-06-17 HISTORY — DX: Type 2 diabetes mellitus without complications: E11.9

## 2015-06-17 NOTE — Assessment & Plan Note (Signed)
Sees derm twice a year 

## 2015-06-17 NOTE — Assessment & Plan Note (Signed)
Check a1c Stressed exercise, weight loss, low sugar/carb diet

## 2015-06-17 NOTE — Assessment & Plan Note (Signed)
Check tsh  Titrate med dose if needed  

## 2015-06-17 NOTE — Progress Notes (Signed)
Subjective:    Patient ID: [name] [name], female    DOB: [birth year], 60 y.o.   MRN: [id number]  HPI She is here to establish with a new pcp.  She is here for a physical exam.   She felt a lump on her chest.  She is unsure how long it has been there.  She denies pain.  It has not gotten larger.  She will have her mammogram next month.    She has had some hair falling out.  Her dermatologist put her on vitamin d and iron.  Her iron is still low and her dose was increased.  He may put her on a bp med med that causes hair growth.  She was at a very stressful job, but quit two months ago.  Her stress level is much better, but she knows that may have been the cause of the hair loss as well.  She still has some hair loss.   He feels an occasional extra beats or palpations.  This has improved since she quit work, but she still has them.  She has seen cardio in the past and they prescribed propranolol for her to take as needed.  She has not taken it in a very long time.    Medications and allergies reviewed with patient and updated if appropriate.  Patient Active Problem List   Diagnosis Date Noted  . Anxiety state 03/28/2015  . Arrhythmia 05/21/2014  . Nonspecific abnormal electrocardiogram (ECG) (EKG) 03/20/2014  . OSA (obstructive sleep apnea) 09/06/2012  . Allergic rhinoconjunctivitis, seasonal and perennial 07/02/2012  . History of colonic polyps 02/09/2010  . METABOLIC SYNDROME X [id number]  . GERD 09/11/2008  . CHOLELITHIASIS 09/11/2008  . FATTY LIVER DISEASE, HX OF 09/11/2008  . HYPOTHYROIDISM 09/11/2007  . Hyperlipidemia 09/11/2007  . HYPERTENSION 09/11/2007  . MEMORY LOSS 09/11/2007  . CARCINOMA, BASAL CELL, HX OF 05/09/2006    Current Outpatient Prescriptions on File Prior to Visit  Medication Sig Dispense Refill  . hydrochlorothiazide (HYDRODIURIL) 25 MG tablet Take 12.5 mg by mouth daily.     Marland Kitchen levothyroxine (SYNTHROID, LEVOTHROID) 100 MCG tablet Take 100 mcg by  mouth daily.     Marland Kitchen LORazepam (ATIVAN) 0.5 MG tablet Take 1 tablet (0.5 mg total) by mouth every 8 (eight) hours as needed for anxiety. 30 tablet 0  . losartan (COZAAR) 100 MG tablet Take 1 tablet (100 mg total) by mouth daily. 90 tablet 0  . mometasone (NASONEX) 50 MCG/ACT nasal spray Place 2 sprays into the nose daily.    . rosuvastatin (CRESTOR) 20 MG tablet Take 1 tablet (20 mg total) by mouth daily. Keep June appt w/new PCP for refills 90 tablet 0  . sertraline (ZOLOFT) 50 MG tablet Take 50 mg by mouth daily.    Marland Kitchen guaiFENesin (MUCINEX) 600 MG 12 hr tablet Take 600 mg by mouth daily. Reported on 06/17/2015    . propranolol (INDERAL) 10 MG tablet 1 every 8 hrs prn (Patient not taking: Reported on 06/17/2015) 30 tablet 5   No current facility-administered medications on file prior to visit.    Past Medical History  Diagnosis Date  . Hyperlipemia   . Hypertension   . Hypothyroidism   . Cholelithiasis   . Hepatic steatosis   . PONV (postoperative nausea and vomiting)   . Arthritis     Past Surgical History  Procedure Laterality Date  . Abdominal hysterectomy      @ age 55, due to fibroids   .  Tonsillectomy    . Mohs surgery  2004    Basal Cell   . Colonoscopy w/ polypectomy  2010     Dr.Jacobs, due 2015   . Dilation and curettage of uterus    . Total knee arthroplasty  09/22/2011    Dr Nicholes Stairs, Left;    Social History   Social History  . Marital Status: Married    Spouse Name: N/A  . Number of Children: N/A  . Years of Education: N/A   Social History Main Topics  . Smoking status: Never Smoker   . Smokeless tobacco: None  . Alcohol Use: Yes     Comment: occasional  . Drug Use: No  . Sexual Activity: Not Asked   Other Topics Concern  . None   Social History Narrative    Family History  Problem Relation Age of Onset  . Diabetes Mother   . Leukemia Mother   . Alcohol abuse Father   . Lung cancer Father     smoker  . Diabetes Brother   . Heart attack Maternal  Uncle     X3 ; MIs in 63s & 1s  . Stroke Maternal Uncle     in his 40s    Review of Systems  Constitutional: Negative for fever, chills and appetite change.       Low energy  Eyes: Negative for visual disturbance.  Respiratory: Negative for cough, shortness of breath and wheezing.   Cardiovascular: Positive for palpitations. Negative for chest pain and leg swelling.  Gastrointestinal: Negative for nausea, abdominal pain, diarrhea, constipation and blood in stool.       No gerd  Endocrine: Negative for polydipsia and polyuria.  Genitourinary: Negative for dysuria and hematuria.  Musculoskeletal: Positive for arthralgias (knee pain - OA). Negative for myalgias, back pain and neck pain.  Skin:       Lump in chest, hair loss  Neurological: Negative for dizziness, light-headedness and headaches.  Psychiatric/Behavioral: Negative for dysphoric mood. The patient is not nervous/anxious.        Objective:   Filed Vitals:   06/17/15 1014  BP: 126/74  Pulse: 85  Temp: 98.5 F (36.9 C)  Resp: 16   Filed Weights   06/17/15 1014  Weight: 226 lb (102.513 kg)   Body mass index is 36.49 kg/(m^2).   Physical Exam Constitutional: She appears well-developed and well-nourished. No distress.  HENT:  Head: Normocephalic and atraumatic.  Right Ear: External ear normal. Normal ear canal and TM Left Ear: External ear normal.  Normal ear canal and TM Mouth/Throat: Oropharynx is clear and moist.  Eyes: Conjunctivae and EOM are normal.  Neck: Neck supple. No tracheal deviation present. No thyromegaly present.  No carotid bruit  Cardiovascular: Normal rate, regular rhythm and normal heart sounds.   No murmur heard.  No edema. Pulmonary/Chest: slight prominence of chest wall on right side near edge of sternum - feels like a fat bad, no discrete lump/mass, no bony growth, very mild in nature.  Effort normal and breath sounds normal. No respiratory distress. She has no wheezes. She has no rales.    Breast: deferred to Gyn Abdominal: Soft. She exhibits no distension. There is no tenderness.  Lymphadenopathy: She has no cervical adenopathy.  Skin: Skin is warm and dry. She is not diaphoretic.  Psychiatric: She has a normal mood and affect. Her behavior is normal.       Assessment & Plan:   Physical exam: Screening blood work  ordered Immunizations -  shingles vaccine today Colonoscopy  Up to date  Mammogram  Up to date  35 - has appt Eye exams  Up to date  EKG today due to palpitations Exercise - not exercising regularly Weight - advised weight loss Skin  - sees derm twice a year Substance abuse - no concern for abuse  Slight prominence in chest wall Possible fat pad, very mild No discrete lump or mass, not bone related cxr 12/2014 normal -  So I will not repeat mammo next month She will just monitor for now and return if this changes  See Problem List for Assessment and Plan of chronic medical problems.

## 2015-06-17 NOTE — Assessment & Plan Note (Signed)
BP well controlled Current regimen effective and well tolerated Continue current medications at current doses  

## 2015-06-17 NOTE — Assessment & Plan Note (Signed)
Likely premature beats, infrequent and decreased since stress has decreased EKG today is wnl Propranolol as needed

## 2015-06-17 NOTE — Patient Instructions (Signed)
Test(s) ordered today. Your results will be released to [employer] (or called to you) after review, usually within 72hours after test completion. If any changes need to be made, you will be notified at that same time.  All other Health Maintenance issues reviewed.   All recommended immunizations and age-appropriate screenings are up-to-date or discussed.  Shingles vaccine administered today.   Medications reviewed and updated.  No changes recommended at this time.    Please followup in one year  Health Maintenance, Female Adopting a healthy lifestyle and getting preventive care can go a long way to promote health and wellness. Talk with your health care provider about what schedule of regular examinations is right for you. This is a good chance for you to check in with your provider about disease prevention and staying healthy. In between checkups, there are plenty of things you can do on your own. Experts have done a lot of research about which lifestyle changes and preventive measures are most likely to keep you healthy. Ask your health care provider for more information. WEIGHT AND DIET  Eat a healthy diet  Be sure to include plenty of vegetables, fruits, low-fat dairy products, and lean protein.  Do not eat a lot of foods high in solid fats, added sugars, or salt.  Get regular exercise. This is one of the most important things you can do for your health.  Most adults should exercise for at least 150 minutes each week. The exercise should increase your heart rate and make you sweat (moderate-intensity exercise).  Most adults should also do strengthening exercises at least twice a week. This is in addition to the moderate-intensity exercise.  Maintain a healthy weight  Body mass index (BMI) is a measurement that can be used to identify possible weight problems. It estimates body fat based on height and weight. Your health care provider can help determine your BMI and help you achieve  or maintain a healthy weight.  For females 60 years of age and older:   A BMI below 18.5 is considered underweight.  A BMI of 18.5 to 24.9 is normal.  A BMI of 25 to 29.9 is considered overweight.  A BMI of 30 and above is considered obese.  Watch levels of cholesterol and blood lipids  You should start having your blood tested for lipids and cholesterol at 60 years of age, then have this test every 5 years.  You may need to have your cholesterol levels checked more often if:  Your lipid or cholesterol levels are high.  You are older than 60 years of age.  You are at high risk for heart disease.  CANCER SCREENING   Lung Cancer  Lung cancer screening is recommended for adults 60-60 years old who are at high risk for lung cancer because of a history of smoking.  A yearly low-dose CT scan of the lungs is recommended for people who:  Currently smoke.  Have quit within the past 15 years.  Have at least a 30-pack-year history of smoking. A pack year is smoking an average of one pack of cigarettes a day for 1 year.  Yearly screening should continue until it has been 15 years since you quit.  Yearly screening should stop if you develop a health problem that would prevent you from having lung cancer treatment.  Breast Cancer  Practice breast self-awareness. This means understanding how your breasts normally appear and feel.  It also means doing regular breast self-exams. Let your health care provider  about any changes, no matter how small.  If you are in your 60s or 60s, you should have a clinical breast exam (CBE) by a health care provider every 1-3 years as part of a regular health exam.  If you are 40 or older, have a CBE every year. Also consider having a breast X-ray (mammogram) every year.  If you have a family history of breast cancer, talk to your health care provider about genetic screening.  If you are at high risk for breast cancer, talk to your health care  provider about having an MRI and a mammogram every year.  Breast cancer gene (BRCA) assessment is recommended for women who have family members with BRCA-related cancers. BRCA-related cancers include:  Breast.  Ovarian.  Tubal.  Peritoneal cancers.  Results of the assessment will determine the need for genetic counseling and BRCA1 and BRCA2 testing. Cervical Cancer Your health care provider may recommend that you be screened regularly for cancer of the pelvic organs (ovaries, uterus, and vagina). This screening involves a pelvic examination, including checking for microscopic changes to the surface of your cervix (Pap test). You may be encouraged to have this screening done every 3 years, beginning at age 60.  For women ages 30-65, health care providers may recommend pelvic exams and Pap testing every 3 years, or they may recommend the Pap and pelvic exam, combined with testing for human papilloma virus (HPV), every 5 years. Some types of HPV increase your risk of cervical cancer. Testing for HPV may also be done on women of any age with unclear Pap test results.  Other health care providers may not recommend any screening for nonpregnant women who are considered low risk for pelvic cancer and who do not have symptoms. Ask your health care provider if a screening pelvic exam is right for you.  If you have had past treatment for cervical cancer or a condition that could lead to cancer, you need Pap tests and screening for cancer for at least 20 years after your treatment. If Pap tests have been discontinued, your risk factors (such as having a new sexual partner) need to be reassessed to determine if screening should resume. Some women have medical problems that increase the chance of getting cervical cancer. In these cases, your health care provider may recommend more frequent screening and Pap tests. Colorectal Cancer  This type of cancer can be detected and often prevented.  Routine  colorectal cancer screening usually begins at 60 years of age and continues through 60 years of age.  Your health care provider may recommend screening at an earlier age if you have risk factors for colon cancer.  Your health care provider may also recommend using home test kits to check for hidden blood in the stool.  A small camera at the end of a tube can be used to examine your colon directly (sigmoidoscopy or colonoscopy). This is done to check for the earliest forms of colorectal cancer.  Routine screening usually begins at age 50.  Direct examination of the colon should be repeated every 5-10 years through 60 years of age. However, you may need to be screened more often if early forms of precancerous polyps or small growths are found. Skin Cancer  Check your skin from head to toe regularly.  Tell your health care provider about any new moles or changes in moles, especially if there is a change in a mole's shape or color.  Also tell your health care provider if you   have a mole that is larger than the size of a pencil eraser.  Always use sunscreen. Apply sunscreen liberally and repeatedly throughout the day.  Protect yourself by wearing long sleeves, pants, a wide-brimmed hat, and sunglasses whenever you are outside. HEART DISEASE, DIABETES, AND HIGH BLOOD PRESSURE   High blood pressure causes heart disease and increases the risk of stroke. High blood pressure is more likely to develop in:  People who have blood pressure in the high end of the normal range (130-139/85-89 mm Hg).  People who are overweight or obese.  People who are African American.  If you are 18-39 years of age, have your blood pressure checked every 3-5 years. If you are 40 years of age or older, have your blood pressure checked every year. You should have your blood pressure measured twice--once when you are at a hospital or clinic, and once when you are not at a hospital or clinic. Record the average of the  two measurements. To check your blood pressure when you are not at a hospital or clinic, you can use:  An automated blood pressure machine at a pharmacy.  A home blood pressure monitor.  If you are between 55 years and 79 years old, ask your health care provider if you should take aspirin to prevent strokes.  Have regular diabetes screenings. This involves taking a blood sample to check your fasting blood sugar level.  If you are at a normal weight and have a low risk for diabetes, have this test once every three years after 60 years of age.  If you are overweight and have a high risk for diabetes, consider being tested at a younger age or more often. PREVENTING INFECTION  Hepatitis B  If you have a higher risk for hepatitis B, you should be screened for this virus. You are considered at high risk for hepatitis B if:  You were born in a country where hepatitis B is common. Ask your health care provider which countries are considered high risk.  Your parents were born in a high-risk country, and you have not been immunized against hepatitis B (hepatitis B vaccine).  You have HIV or AIDS.  You use needles to inject street drugs.  You live with someone who has hepatitis B.  You have had sex with someone who has hepatitis B.  You get hemodialysis treatment.  You take certain medicines for conditions, including cancer, organ transplantation, and autoimmune conditions. Hepatitis C  Blood testing is recommended for:  Everyone born from 1945 through 1965.  Anyone with known risk factors for hepatitis C. Sexually transmitted infections (STIs)  You should be screened for sexually transmitted infections (STIs) including gonorrhea and chlamydia if:  You are sexually active and are younger than 60 years of age.  You are older than 60 years of age and your health care provider tells you that you are at risk for this type of infection.  Your sexual activity has changed since you were  last screened and you are at an increased risk for chlamydia or gonorrhea. Ask your health care provider if you are at risk.  If you do not have HIV, but are at risk, it may be recommended that you take a prescription medicine daily to prevent HIV infection. This is called pre-exposure prophylaxis (PrEP). You are considered at risk if:  You are sexually active and do not regularly use condoms or know the HIV status of your partner(s).  You take drugs by injection.    You are sexually active with a partner who has HIV. Talk with your health care provider about whether you are at high risk of being infected with HIV. If you choose to begin PrEP, you should first be tested for HIV. You should then be tested every 3 months for as long as you are taking PrEP.  PREGNANCY   If you are premenopausal and you may become pregnant, ask your health care provider about preconception counseling.  If you may become pregnant, take 400 to 800 micrograms (mcg) of folic acid every day.  If you want to prevent pregnancy, talk to your health care provider about birth control (contraception). OSTEOPOROSIS AND MENOPAUSE   Osteoporosis is a disease in which the bones lose minerals and strength with aging. This can result in serious bone fractures. Your risk for osteoporosis can be identified using a bone density scan.  If you are 80 years of age or older, or if you are at risk for osteoporosis and fractures, ask your health care provider if you should be screened.  Ask your health care provider whether you should take a calcium or vitamin D supplement to lower your risk for osteoporosis.  Menopause may have certain physical symptoms and risks.  Hormone replacement therapy may reduce some of these symptoms and risks. Talk to your health care provider about whether hormone replacement therapy is right for you.  HOME CARE INSTRUCTIONS   Schedule regular health, dental, and eye exams.  Stay current with your  immunizations.   Do not use any tobacco products including cigarettes, chewing tobacco, or electronic cigarettes.  If you are pregnant, do not drink alcohol.  If you are breastfeeding, limit how much and how often you drink alcohol.  Limit alcohol intake to no more than 1 drink per day for nonpregnant women. One drink equals 12 ounces of beer, 5 ounces of wine, or 1 ounces of hard liquor.  Do not use street drugs.  Do not share needles.  Ask your health care provider for help if you need support or information about quitting drugs.  Tell your health care provider if you often feel depressed.  Tell your health care provider if you have ever been abused or do not feel safe at home.   This information is not intended to replace advice given to you by your health care provider. Make sure you discuss any questions you have with your health care provider.   Document Released: 07/25/2010 Document Revised: 01/30/2014 Document Reviewed: 12/11/2012 Elsevier Interactive Patient Education [employer].

## 2015-06-17 NOTE — Progress Notes (Signed)
Pre visit review using our clinic review tool, if applicable. No additional management support is needed unless otherwise documented below in the visit note. 

## 2015-06-30 ENCOUNTER — Other Ambulatory Visit: Payer: Self-pay | Admitting: *Deleted

## 2015-06-30 MED ORDER — LEVOTHYROXINE SODIUM 100 MCG PO TABS: 100.0000 ug | ORAL_TABLET | Freq: Every day | ORAL | Status: DC

## 2015-06-30 NOTE — Telephone Encounter (Signed)
Receive call pt requesting refill on her Levothyroxine. Sent electronically to pharmacy...Johny Chess

## 2015-07-12 ENCOUNTER — Other Ambulatory Visit: Payer: Self-pay | Admitting: *Deleted

## 2015-07-12 MED ORDER — LOSARTAN POTASSIUM 100 MG PO TABS: 100.0000 mg | ORAL_TABLET | Freq: Every day | ORAL | Status: DC

## 2015-07-26 ENCOUNTER — Other Ambulatory Visit: Payer: Self-pay | Admitting: *Deleted

## 2015-07-26 MED ORDER — SERTRALINE HCL 50 MG PO TABS: 50.0000 mg | ORAL_TABLET | Freq: Every day | ORAL | Status: DC

## 2015-07-26 NOTE — Telephone Encounter (Signed)
Receive call pt requesting refills on her zoloft. Inform will send to [facility]...Johny Chess

## 2015-07-28 ENCOUNTER — Other Ambulatory Visit: Payer: Self-pay | Admitting: Internal Medicine

## 2015-08-12 ENCOUNTER — Telehealth: Payer: Self-pay | Admitting: Emergency Medicine

## 2015-08-18 MED ORDER — HYDROCHLOROTHIAZIDE 25 MG PO TABS: 12.5000 mg | ORAL_TABLET | Freq: Every day | ORAL | 3 refills | Status: DC

## 2015-08-18 NOTE — Telephone Encounter (Signed)
Pt needs a prescription refill on hydrochlorothiazide (HYDRODIURIL) 25 MG tablet. Pharmacy is [facility] in [place]. Please follow up thanks.

## 2015-08-18 NOTE — Telephone Encounter (Signed)
Notified pt refill has been sent to [facility]...[name] [name]

## 2015-08-19 ENCOUNTER — Telehealth: Payer: Self-pay | Admitting: Internal Medicine

## 2015-08-19 NOTE — Telephone Encounter (Signed)
Rec'd from [facility] forward 6 pages to Dr. Birdena Crandall

## 2015-10-15 ENCOUNTER — Ambulatory Visit: Payer: PRIVATE HEALTH INSURANCE

## 2015-12-24 ENCOUNTER — Encounter: Payer: Self-pay | Admitting: Internal Medicine

## 2015-12-24 ENCOUNTER — Ambulatory Visit (INDEPENDENT_AMBULATORY_CARE_PROVIDER_SITE_OTHER): Payer: PRIVATE HEALTH INSURANCE | Admitting: Internal Medicine

## 2015-12-24 DIAGNOSIS — R7303 Prediabetes: Secondary | ICD-10-CM | POA: Diagnosis not present

## 2015-12-24 DIAGNOSIS — I10 Essential (primary) hypertension: Secondary | ICD-10-CM

## 2015-12-24 MED ORDER — AZITHROMYCIN 250 MG PO TABS: ORAL_TABLET | ORAL | 1 refills | Status: DC

## 2015-12-24 MED ORDER — HYDROCODONE-HOMATROPINE 5-1.5 MG/5ML PO SYRP: 5.0000 mL | ORAL_SOLUTION | Freq: Four times a day (QID) | ORAL | 0 refills | Status: AC | PRN

## 2015-12-24 NOTE — Progress Notes (Signed)
Subjective:    Patient ID: [name] [name], female    DOB: 05-22-1955, 60 y.o.   MRN: [id number]  HPI   Here with 5 -6 days acute onset fever, facial pain, pressure, headache, general weakness and malaise, and greenish d/c, with 2 days worsening now severe ST and cough, but pt denies chest pain, wheezing, increased sob or doe, orthopnea, PND, increased LE swelling, palpitations, dizziness or syncope.  Pt states not taking the propraolol or lorazapem, asks to take off list Pt denies new neurological symptoms such as new headache, or facial or extremity weakness or numbness   Pt denies polydipsia, polyuria     Past Medical History:  Diagnosis Date  . Arthritis   . Cholelithiasis   . Hepatic steatosis   . Hyperlipemia   . Hypertension   . Hypothyroidism   . PONV (postoperative nausea and vomiting)    Past Surgical History:  Procedure Laterality Date  . ABDOMINAL HYSTERECTOMY     @ age 51, due to fibroids   . COLONOSCOPY W/ POLYPECTOMY  2010    Dr.Jacobs, due 2015   . DILATION AND CURETTAGE OF UTERUS    . MOHS SURGERY  2004   Basal Cell   . TONSILLECTOMY    . TOTAL KNEE ARTHROPLASTY  09/22/2011   Dr Nicholes Stairs, Left;    reports that she has never smoked. She has never used smokeless tobacco. She reports that she drinks alcohol. She reports that she does not use drugs. family history includes Alcohol abuse in her father; Diabetes in her brother and mother; Heart attack in her maternal uncle; Leukemia in her mother; Lung cancer in her father; Stroke in her maternal uncle. Allergies  Allergen Reactions  . Ampicillin     rash  . Oxycodone Nausea Only  . Doxycycline     Joint symptoms w/o rash or fever   Current Outpatient Prescriptions on File Prior to Visit  Medication Sig Dispense Refill  . guaiFENesin (MUCINEX) 600 MG 12 hr tablet Take 600 mg by mouth daily. Reported on 06/17/2015    . hydrochlorothiazide (HYDRODIURIL) 25 MG tablet Take 0.5 tablets (12.5 mg total) by mouth  daily. 30 tablet 3  . levothyroxine (SYNTHROID, LEVOTHROID) 100 MCG tablet Take 1 tablet (100 mcg total) by mouth daily. 90 tablet 1  . losartan (COZAAR) 100 MG tablet Take 1 tablet (100 mg total) by mouth daily. 90 tablet 3  . mometasone (NASONEX) 50 MCG/ACT nasal spray Place 2 sprays into the nose daily.    . rosuvastatin (CRESTOR) 20 MG tablet TAKE ONE (1) TABLET EACH DAY 90 tablet 2  . sertraline (ZOLOFT) 50 MG tablet Take 1 tablet (50 mg total) by mouth daily. 90 tablet 2   No current facility-administered medications on file prior to visit.    Review of Systems  Constitutional: Negative for unusual diaphoresis or night sweats HENT: Negative for ear swelling or discharge Eyes: Negative for worsening visual haziness  Respiratory: Negative for choking and stridor.   Gastrointestinal: Negative for distension or worsening eructation Genitourinary: Negative for retention or change in urine volume.  Musculoskeletal: Negative for other MSK pain or swelling Skin: Negative for color change and worsening wound Neurological: Negative for tremors and numbness other than noted  Psychiatric/Behavioral: Negative for decreased concentration or agitation other than above   All other system neg per pt    Objective:   Physical Exam BP 138/78   Pulse 94   Temp 98.4 F (36.9 C) (Oral)   Resp  20   Wt 238 lb (108 kg)   SpO2 96%   BMI 38.41 kg/m  VS noted, mild ill Constitutional: Pt appears in no apparent distress HENT: Head: NCAT.  Right Ear: External ear normal.  Left Ear: External ear normal.  Eyes: . Pupils are equal, round, and reactive to light. Conjunctivae and EOM are normal Bilat tm's with mild erythema.  Max sinus areas mod to severe tender.  Pharynx with mild erythema, no exudate Neck: Normal range of motion. Neck supple.  Cardiovascular: Normal rate and regular rhythm.   Pulmonary/Chest: Effort normal and breath sounds without rales or wheezing.  Neurological: Pt is alert. Not  confused , motor grossly intact Skin: Skin is warm. No rash, no LE edema Psychiatric: Pt behavior is normal. No agitation.      Assessment & Plan:

## 2015-12-24 NOTE — Assessment & Plan Note (Signed)
Asympt, . Lab Results  Component Value Date   HGBA1C 6.2 03/23/2014   stable overall by history and exam, recent data reviewed with pt, and pt to continue medical treatment as before,  to f/u any worsening symptoms or concerns

## 2015-12-24 NOTE — Assessment & Plan Note (Signed)
stable overall by history and exam, recent data reviewed with pt, and pt to continue medical treatment as before,  to f/u any worsening symptoms or concerns BP Readings from Last 3 Encounters:  12/24/15 138/78  06/17/15 126/74  03/19/15 124/72

## 2015-12-24 NOTE — Progress Notes (Signed)
Pre visit review using our clinic review tool, if applicable. No additional management support is needed unless otherwise documented below in the visit note. 

## 2015-12-24 NOTE — Patient Instructions (Addendum)
Please take all new medication as prescribed - the antibiotic, and cough medicine if needed  Please continue all other medications as before, and refills have been done if requested.  Please have the pharmacy call with any other refills you may need.  Please keep your appointments with your specialists as you may have planned     

## 2015-12-28 ENCOUNTER — Telehealth: Payer: Self-pay | Admitting: Internal Medicine

## 2015-12-28 NOTE — Telephone Encounter (Signed)
Pt called in and has some question about a medication that her dermatologist gave her   Best number [phone]

## 2015-12-29 NOTE — Telephone Encounter (Signed)
Spoke with pt, she was seen by derm and he would like to put her on  For hair growth. He would like to make sure this okay with PCP before placing pt on it.    Pt also needs a letter for the [employer] verifying why she was out of work in Feb for. Pt has hx of stress and heart related issues. Are you okay with me typing letter up?

## 2015-12-30 NOTE — Telephone Encounter (Signed)
Ok to take spironolactone as long as her potasium and kidney function are checked.  Ok to write letter

## 2016-01-05 ENCOUNTER — Encounter: Payer: Self-pay | Admitting: Emergency Medicine

## 2016-01-05 NOTE — Telephone Encounter (Signed)
Letter has been completed for MDs approval and will be mailed once signed by provider

## 2016-01-14 ENCOUNTER — Other Ambulatory Visit: Payer: Self-pay | Admitting: Internal Medicine

## 2016-02-18 ENCOUNTER — Telehealth: Payer: Self-pay | Admitting: Internal Medicine

## 2016-02-18 NOTE — Telephone Encounter (Signed)
Pt called in and needs the nurse to call her about a letter that Dr Quay Burow provided for her last year.  She has some questions

## 2016-02-21 ENCOUNTER — Encounter: Payer: Self-pay | Admitting: Emergency Medicine

## 2016-02-21 NOTE — Telephone Encounter (Signed)
Spoke with pt, letter from Dec needed to be revised and faxed back to her employer.

## 2016-03-01 DIAGNOSIS — L300 Nummular dermatitis: Secondary | ICD-10-CM | POA: Diagnosis not present

## 2016-03-01 DIAGNOSIS — Z85828 Personal history of other malignant neoplasm of skin: Secondary | ICD-10-CM | POA: Diagnosis not present

## 2016-03-01 DIAGNOSIS — Z08 Encounter for follow-up examination after completed treatment for malignant neoplasm: Secondary | ICD-10-CM | POA: Diagnosis not present

## 2016-03-01 DIAGNOSIS — L570 Actinic keratosis: Secondary | ICD-10-CM | POA: Diagnosis not present

## 2016-03-06 ENCOUNTER — Encounter: Payer: Self-pay | Admitting: Cardiovascular Disease

## 2016-03-06 ENCOUNTER — Ambulatory Visit (INDEPENDENT_AMBULATORY_CARE_PROVIDER_SITE_OTHER): Payer: 59 | Admitting: Cardiovascular Disease

## 2016-03-06 VITALS — BP 110/72 | HR 76 | Ht 66.0 in | Wt 235.0 lb

## 2016-03-06 DIAGNOSIS — I10 Essential (primary) hypertension: Secondary | ICD-10-CM

## 2016-03-06 DIAGNOSIS — R002 Palpitations: Secondary | ICD-10-CM | POA: Diagnosis not present

## 2016-03-06 DIAGNOSIS — E782 Mixed hyperlipidemia: Secondary | ICD-10-CM | POA: Diagnosis not present

## 2016-03-06 NOTE — Progress Notes (Signed)
Cardiology Office Note    Date:  03/06/2016   ID:  [name] [name], DOB 12-19-1955, MRN [id number]  PCP:  Binnie Rail, MD  Cardiologist:   Sanda Klein, MD   chief complaint: Palpitations   History of Present Illness:  [name] [name] is a 61 y.o. female with HTN and hyperlipidemia, Presenting with recurrent palpitations.  In the past these responded well to treatment with a beta blocker, but she wean these off as the symptoms improved with resolution of social stressors. For the last few months she had worsening palpitations at her job where she had a new boss. Eventually she quit in the palpitations resolved virtually immediately. She has now become embroiled in a workplace EEOC complaint and whenever she discusses these issues she has applications again. Outside of this sequence of events, she feels great. The palpitations sound like isolated "skipped beats" with a regular background rhythm. He did not associate dyspnea, angina, syncope or dizziness, focal neurological complaints.  In June 2016 she had an echocardiogram showing normal left ventricular systolic function, moderate LVH with mild diastolic dysfunction, aortic valve sclerosis. She had a normal treadmill stress test, exercising 6 minutes on the Bruce protocol. 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  Additional medical problems include borderline diabetes mellitus and mild obstructive sleep apnea that did not meet criteria for initiation of CPAP. She had a lot of problems with cervical radiculopathy and left shoulder pain that improved with physical therapy.  Past Medical History:  Diagnosis Date  . Arthritis   . Cholelithiasis   . Hepatic steatosis   . Hyperlipemia   . Hypertension   . Hypothyroidism   . PONV (postoperative nausea and vomiting)     Past Surgical History:  Procedure Laterality Date  . ABDOMINAL  HYSTERECTOMY     @ age 36, due to fibroids   . COLONOSCOPY W/ POLYPECTOMY  2010    Dr.Jacobs, due 2015   . DILATION AND CURETTAGE OF UTERUS    . MOHS SURGERY  2004   Basal Cell   . TONSILLECTOMY    . TOTAL KNEE ARTHROPLASTY  09/22/2011   Dr Nicholes Stairs, Left;    Current Medications: Outpatient Medications Prior to Visit  Medication Sig Dispense Refill  . hydrochlorothiazide (HYDRODIURIL) 25 MG tablet Take 0.5 tablets (12.5 mg total) by mouth daily. 30 tablet 3  . levothyroxine (SYNTHROID, LEVOTHROID) 100 MCG tablet Take 1 tablet (100 mcg total) by mouth daily before breakfast. --- must have labs for further refills. 90 tablet 0  . losartan (COZAAR) 100 MG tablet Take 1 tablet (100 mg total) by mouth daily. 90 tablet 3  . rosuvastatin (CRESTOR) 20 MG tablet TAKE ONE (1) TABLET EACH DAY 90 tablet 2  . sertraline (ZOLOFT) 50 MG tablet Take 1 tablet (50 mg total) by mouth daily. 90 tablet 2  . azithromycin (ZITHROMAX Z-PAK) 250 MG tablet 2 tab by mouth on day 1, then 1 per day 6 tablet 1  . guaiFENesin (MUCINEX) 600 MG 12 hr tablet Take 600 mg by mouth daily. Reported on 06/17/2015    . mometasone (NASONEX) 50 MCG/ACT nasal spray Place 2 sprays into the nose daily.     No facility-administered medications prior to visit.      Allergies:   Ampicillin; Doxycycline; Oxycodone; and Penicillins   Social History   Social History  . Marital status: Married    Spouse name: N/A  .  Number of children: N/A  . Years of education: N/A   Social History Main Topics  . Smoking status: Never Smoker  . Smokeless tobacco: Never Used  . Alcohol use Yes     Comment: rare  . Drug use: No  . Sexual activity: Not Asked   Other Topics Concern  . None   Social History Narrative   Not exercising regularly     Family History:  The patient's family history includes Alcohol abuse in her father; Diabetes in her brother and mother; Heart attack in her maternal uncle; Leukemia in her mother; Lung cancer in  her father; Stroke in her maternal uncle.   ROS:   Please see the history of present illness.    ROS All other systems reviewed and are negative.   PHYSICAL EXAM:   VS:  BP 110/72 (BP Location: Left Arm, Patient Position: Sitting, Cuff Size: Large)   Pulse 76   Ht 5\' 6"  (1.676 m)   Wt 106.6 kg (235 lb)   BMI 37.93 kg/m    GEN: Well nourished, well developed, in no acute distress  HEENT: normal  Neck: no JVD, carotid bruits, or masses Cardiac: RRR; no murmurs, rubs, or gallops,no edema  Respiratory:  clear to auscultation bilaterally, normal work of breathing GI: soft, nontender, nondistended, + BS MS: no deformity or atrophy  Skin: warm and dry, no rash Neuro:  Alert and Oriented x 3, Strength and sensation are intact Psych: euthymic mood, full affect  Wt Readings from Last 3 Encounters:  03/06/16 106.6 kg (235 lb)  12/24/15 108 kg (238 lb)  06/17/15 102.5 kg (226 lb)     Studies/Labs Reviewed:   EKG:  EKG is ordered today.  The ekg ordered today demonstrates Normal sinus rhythm, left anterior fascicular block, otherwise normal tracing  Recent Labs: No results found for requested labs within last 8760 hours.   Lipid Panel    Component Value Date/Time   CHOL 155 03/20/2014 1049   TRIG 174 (H) 03/20/2014 1049   HDL 39 (L) 03/20/2014 1049   CHOLHDL 4 06/25/2012 0806   VLDL 23.0 06/25/2012 0806   LDLCALC 81 03/20/2014 1049   LDLDIRECT 86.7 02/09/2010 1138     ASSESSMENT:    1. Essential hypertension   2. Mixed hyperlipidemia   3. Severe obesity (BMI 35.0-39.9) (HCC)   4. Palpitations      PLAN:  In order of problems listed above:  1. HTN: Excellent control.  2. HLP: Minimal hypertriglyceridemia and mildly depressed HDL will both improve with weight loss, which is strongly recommended. 3. Obesity: Discussed the numerous adverse health implications of her weight. Discussed activity and exercise. 4. Palpitations: These sound like adrenergically driven PACs  and/or PVCs. Offered switching her blood pressure medication to a beta blocker, but she tells me that all she needed was the reassurance that she did not have a serious rhythm abnormality. She expects the administrative issues that she is involved and to resolve in the near future. She does not want to change her medications at this time. Advised her to consider purchasing a commercially available electrical rhythm monitor    Medication Adjustments/Labs and Tests Ordered: Current medicines are reviewed at length with the patient today.  Concerns regarding medicines are outlined above.  Medication changes, Labs and Tests ordered today are listed in the Patient Instructions below. Patient Instructions  Alivecor device - [email or web address]  Dr Sallyanne Kuster recommends that you follow-up with him as needed.    Signed, Beryl Hornberger,  MD  03/06/2016 12:56 PM    [address], [place], [place]  [address] Phone: [phone]; Fax: [phone]

## 2016-03-06 NOTE — Patient Instructions (Signed)
Alivecor device - [email or web address]  Dr Sallyanne Kuster recommends that you follow-up with him as needed.

## 2016-03-10 ENCOUNTER — Ambulatory Visit: Payer: PRIVATE HEALTH INSURANCE | Admitting: Cardiovascular Disease

## 2016-03-10 ENCOUNTER — Ambulatory Visit: Payer: 59

## 2016-03-16 ENCOUNTER — Ambulatory Visit (INDEPENDENT_AMBULATORY_CARE_PROVIDER_SITE_OTHER): Payer: 59 | Admitting: Internal Medicine

## 2016-03-16 ENCOUNTER — Encounter: Payer: Self-pay | Admitting: Internal Medicine

## 2016-03-16 DIAGNOSIS — J0100 Acute maxillary sinusitis, unspecified: Secondary | ICD-10-CM

## 2016-03-16 DIAGNOSIS — J019 Acute sinusitis, unspecified: Secondary | ICD-10-CM | POA: Insufficient documentation

## 2016-03-16 MED ORDER — AZITHROMYCIN 250 MG PO TABS: ORAL_TABLET | ORAL | 0 refills | Status: DC

## 2016-03-16 NOTE — Progress Notes (Signed)
Pre-visit discussion using our clinic review tool. No additional management support is needed unless otherwise documented below in the visit note.  

## 2016-03-16 NOTE — Patient Instructions (Signed)
Use over-the-counter  "cold" medicines  such as"Afrin" nasal spray for nasal congestion as directed instead. Use " Delsym" or" Robitussin" cough syrup varietis for cough.  You can use plain "Tylenol" or "Advil" for fever, chills and achyness. Use Halls or Ricola cough drops.  Please, make an appointment if you are not better or if you're worse.  

## 2016-03-16 NOTE — Assessment & Plan Note (Signed)
Zpac 

## 2016-03-16 NOTE — Progress Notes (Signed)
Subjective:  Patient ID: [name] [name], female    DOB: December 07, 1955  Age: 60 y.o. MRN: [id number]  CC: Sinus Problem (running nose, post nasal drip, pressure)   HPI [name] [name] presents for URI sx x 2-3 d. C/o green d/c, HA.  Outpatient Medications Prior to Visit  Medication Sig Dispense Refill  . hydrochlorothiazide (HYDRODIURIL) 25 MG tablet Take 0.5 tablets (12.5 mg total) by mouth daily. 30 tablet 3  . levothyroxine (SYNTHROID, LEVOTHROID) 100 MCG tablet Take 1 tablet (100 mcg total) by mouth daily before breakfast. --- must have labs for further refills. 90 tablet 0  . losartan (COZAAR) 100 MG tablet Take 1 tablet (100 mg total) by mouth daily. 90 tablet 3  . rosuvastatin (CRESTOR) 20 MG tablet TAKE ONE (1) TABLET EACH DAY 90 tablet 2  . sertraline (ZOLOFT) 50 MG tablet Take 1 tablet (50 mg total) by mouth daily. 90 tablet 2   No facility-administered medications prior to visit.     ROS Review of Systems  Constitutional: Negative for activity change, appetite change, chills, fatigue and unexpected weight change.  HENT: Positive for congestion, rhinorrhea and sinus pain. Negative for mouth sores and sinus pressure.   Eyes: Negative for visual disturbance.  Respiratory: Positive for cough. Negative for chest tightness.   Gastrointestinal: Negative for abdominal pain and nausea.  Genitourinary: Negative for difficulty urinating, frequency and vaginal pain.  Musculoskeletal: Negative for back pain and gait problem.  Skin: Negative for pallor and rash.  Neurological: Negative for dizziness, tremors, weakness, numbness and headaches.  Psychiatric/Behavioral: Negative for confusion and sleep disturbance.    Objective:  BP 124/72   Pulse 98   Temp 99 F (37.2 C) (Oral)   Resp 16   Ht 5\' 6"  (1.676 m)   Wt 234 lb 4 oz (106.3 kg)   SpO2 94%   BMI 37.81 kg/m   BP Readings from Last 3 Encounters:  03/16/16 124/72  03/06/16 110/72  12/24/15 138/78    Wt  Readings from Last 3 Encounters:  03/16/16 234 lb 4 oz (106.3 kg)  03/06/16 235 lb (106.6 kg)  12/24/15 238 lb (108 kg)    Physical Exam  Constitutional: She appears well-developed. No distress.  HENT:  Head: Normocephalic.  Right Ear: External ear normal.  Left Ear: External ear normal.  Nose: Nose normal.  Mouth/Throat: Oropharynx is clear and moist.  Eyes: Conjunctivae are normal. Pupils are equal, round, and reactive to light. Right eye exhibits no discharge. Left eye exhibits no discharge.  Neck: Normal range of motion. Neck supple. No JVD present. No tracheal deviation present. No thyromegaly present.  Cardiovascular: Normal rate, regular rhythm and normal heart sounds.   Pulmonary/Chest: No stridor. No respiratory distress. She has no wheezes.  Abdominal: Soft. Bowel sounds are normal. She exhibits no distension and no mass. There is no tenderness. There is no rebound and no guarding.  Musculoskeletal: She exhibits no edema or tenderness.  Lymphadenopathy:    She has no cervical adenopathy.  Neurological: She displays normal reflexes. No cranial nerve deficit. She exhibits normal muscle tone. Coordination normal.  Skin: No rash noted. No erythema.  Psychiatric: She has a normal mood and affect. Her behavior is normal. Judgment and thought content normal.  eryth throat, nasal mucosa  Lab Results  Component Value Date   WBC 10.2 12/30/2014   HGB 16.5 (H) 12/30/2014   HCT 49.4 (H) 12/30/2014   PLT 367 12/30/2014   GLUCOSE 103 (H) 12/30/2014  CHOL 155 03/20/2014   TRIG 174 (H) 03/20/2014   HDL 39 (L) 03/20/2014   LDLDIRECT 86.7 02/09/2010   LDLCALC 81 03/20/2014   ALT 25 03/20/2014   AST 25 03/20/2014   NA 138 12/30/2014   K 3.9 12/30/2014   CL 101 12/30/2014   CREATININE 0.97 12/30/2014   BUN 18 12/30/2014   CO2 28 12/30/2014   TSH 2.62 01/13/2015   INR 1.00 09/19/2011   HGBA1C 6.2 03/23/2014   MICROALBUR 1.0 02/27/2006    Dg Chest 2 View  Result Date:  12/30/2014 CLINICAL DATA:  Chest pain EXAM: CHEST  2 VIEW COMPARISON:  09/19/2011 FINDINGS: Normal heart size and mediastinal contours. No acute infiltrate or edema. No effusion or pneumothorax. No acute osseous findings. IMPRESSION: Negative chest. Electronically Signed   By: Monte Fantasia M.D.   On: 12/30/2014 13:24    Assessment & Plan:   There are no diagnoses linked to this encounter. I am having Ms. Lard maintain her losartan, sertraline, rosuvastatin, hydrochlorothiazide, and levothyroxine.  No orders of the defined types were placed in this encounter.    Follow-up: No Follow-up on file.  Walker Kehr, MD

## 2016-03-27 ENCOUNTER — Other Ambulatory Visit: Payer: Self-pay | Admitting: Internal Medicine

## 2016-04-17 DIAGNOSIS — M542 Cervicalgia: Secondary | ICD-10-CM | POA: Diagnosis not present

## 2016-04-17 DIAGNOSIS — M5412 Radiculopathy, cervical region: Secondary | ICD-10-CM | POA: Diagnosis not present

## 2016-04-17 DIAGNOSIS — M5022 Other cervical disc displacement, mid-cervical region, unspecified level: Secondary | ICD-10-CM | POA: Diagnosis not present

## 2016-04-28 ENCOUNTER — Other Ambulatory Visit: Payer: Self-pay | Admitting: Internal Medicine

## 2016-05-12 ENCOUNTER — Other Ambulatory Visit: Payer: Self-pay | Admitting: Internal Medicine

## 2016-05-15 DIAGNOSIS — M5022 Other cervical disc displacement, mid-cervical region, unspecified level: Secondary | ICD-10-CM | POA: Diagnosis not present

## 2016-05-15 DIAGNOSIS — M542 Cervicalgia: Secondary | ICD-10-CM | POA: Diagnosis not present

## 2016-05-15 DIAGNOSIS — M5412 Radiculopathy, cervical region: Secondary | ICD-10-CM | POA: Diagnosis not present

## 2016-06-06 ENCOUNTER — Other Ambulatory Visit: Payer: Self-pay | Admitting: Internal Medicine

## 2016-06-09 ENCOUNTER — Other Ambulatory Visit (INDEPENDENT_AMBULATORY_CARE_PROVIDER_SITE_OTHER): Payer: 59

## 2016-06-09 DIAGNOSIS — R7303 Prediabetes: Secondary | ICD-10-CM

## 2016-06-09 DIAGNOSIS — R7989 Other specified abnormal findings of blood chemistry: Secondary | ICD-10-CM

## 2016-06-09 DIAGNOSIS — Z0000 Encounter for general adult medical examination without abnormal findings: Secondary | ICD-10-CM

## 2016-06-09 LAB — COMPREHENSIVE METABOLIC PANEL
ALBUMIN: 4.3 g/dL (ref 3.5–5.2)
ALK PHOS: 55 U/L (ref 39–117)
ALT: 23 U/L (ref 0–35)
AST: 21 U/L (ref 0–37)
BUN: 16 mg/dL (ref 6–23)
CALCIUM: 9.4 mg/dL (ref 8.4–10.5)
CO2: 30 mEq/L (ref 19–32)
CREATININE: 1.04 mg/dL (ref 0.40–1.20)
Chloride: 103 mEq/L (ref 96–112)
GFR: 57.24 mL/min — ABNORMAL LOW (ref >60.00)
Glucose, Bld: 125 mg/dL — ABNORMAL HIGH (ref 70–99)
POTASSIUM: 4.3 meq/L (ref 3.5–5.1)
Sodium: 139 mEq/L (ref 135–145)
Total Bilirubin: 0.6 mg/dL (ref 0.2–1.2)
Total Protein: 6.9 g/dL (ref 6.0–8.3)

## 2016-06-09 LAB — LIPID PANEL
CHOL/HDL RATIO: 4
CHOLESTEROL: 160 mg/dL (ref 0–200)
HDL: 36.2 mg/dL — ABNORMAL LOW (ref >39.00)
NonHDL: 123.53
TRIGLYCERIDES: 216 mg/dL — AB (ref 0.0–149.0)
VLDL: 43.2 mg/dL — AB (ref 0.0–40.0)

## 2016-06-09 LAB — HEMOGLOBIN A1C: Hgb A1c MFr Bld: 6.2 % (ref 4.6–6.5)

## 2016-06-09 LAB — LDL CHOLESTEROL, DIRECT: LDL DIRECT: 90 mg/dL

## 2016-06-09 LAB — CBC WITH DIFFERENTIAL/PLATELET
BASOS PCT: 0.9 % (ref 0.0–3.0)
Basophils Absolute: 0.1 10*3/uL (ref 0.0–0.1)
EOS PCT: 4.2 % (ref 0.0–5.0)
Eosinophils Absolute: 0.3 10*3/uL (ref 0.0–0.7)
HEMATOCRIT: 46.9 % — AB (ref 36.0–46.0)
HEMOGLOBIN: 15.9 g/dL — AB (ref 12.0–15.0)
LYMPHS PCT: 21.8 % (ref 12.0–46.0)
Lymphs Abs: 1.5 10*3/uL (ref 0.7–4.0)
MCHC: 34 g/dL (ref 30.0–36.0)
MCV: 88.6 fl (ref 78.0–100.0)
MONOS PCT: 8 % (ref 3.0–12.0)
Monocytes Absolute: 0.6 10*3/uL (ref 0.1–1.0)
Neutro Abs: 4.6 10*3/uL (ref 1.4–7.7)
Neutrophils Relative %: 65.1 % (ref 43.0–77.0)
Platelets: 312 10*3/uL (ref 150.0–400.0)
RBC: 5.29 Mil/uL — ABNORMAL HIGH (ref 3.87–5.11)
RDW: 13.5 % (ref 11.5–15.5)
WBC: 7.1 10*3/uL (ref 4.0–10.5)

## 2016-06-09 LAB — TSH: TSH: 2.6 u[IU]/mL (ref 0.35–4.50)

## 2016-06-11 ENCOUNTER — Encounter: Payer: Self-pay | Admitting: Internal Medicine

## 2016-06-11 NOTE — Progress Notes (Signed)
Subjective:    Patient ID: [name] [name], female    DOB: 16-May-1955, 61 y.o.   MRN: [id number]  HPI She is here for a physical exam.     She is not exercising regularly.  She knows she needs to get back into regular exercise.    Anxiety: She is taking her medication daily as prescribed. She denies any side effects from the medication. She feels her anxiety is well controlled and she is happy with her current dose of medication.   Prediabetes:  She is compliant with a low sugar/carbohydrate diet.    Hypertension: She is taking her medication daily. She is compliant with a low sodium diet.  Her bp has been on the lower side.     Hypothyroidism:  She is taking her medication daily.  She denies any recent changes in energy or weight that are unexplained.   Hyperlipidemia: She is taking her medication daily. She is compliant with a low fat/cholesterol diet.   She denies myalgias.   She is working full time.  She is concerned about her memory.  She finds it hard to remember things.  She thinks this has gotten a little better since changing jobs.   Her thumbs tremor at times.  They do not do it often.    Medications and allergies reviewed with patient and updated if appropriate.  Patient Active Problem List   Diagnosis Date Noted  . Herniated cervical disc 06/17/2015  . Prediabetes 06/17/2015  . Palpitations 06/17/2015  . Anxiety state 03/28/2015  . Nonspecific abnormal electrocardiogram (ECG) (EKG) 03/20/2014  . OSA (obstructive sleep apnea) 09/06/2012  . Allergic rhinoconjunctivitis, seasonal and perennial 07/02/2012  . History of colonic polyps 02/09/2010  . METABOLIC SYNDROME X 16/01/930  . GERD 09/11/2008  . CHOLELITHIASIS 09/11/2008  . FATTY LIVER DISEASE, HX OF 09/11/2008  . Hypothyroidism 09/11/2007  . Hyperlipidemia 09/11/2007  . Essential hypertension 09/11/2007  . Memory loss 09/11/2007  . CARCINOMA, BASAL CELL, HX OF 05/09/2006    Current Outpatient  Prescriptions on File Prior to Visit  Medication Sig Dispense Refill  . hydrochlorothiazide (HYDRODIURIL) 25 MG tablet Take 0.5 tablets (12.5 mg total) by mouth daily. -- Office visit needed for further refills 15 tablet 0  . levothyroxine (SYNTHROID, LEVOTHROID) 100 MCG tablet Take 1 tablet (100 mcg total) by mouth daily before breakfast. Yearly physical w/labs due in May must see Md for refills 90 tablet 0  . losartan (COZAAR) 100 MG tablet Take 1 tablet (100 mg total) by mouth daily. 90 tablet 3  . rosuvastatin (CRESTOR) 20 MG tablet Take 1 tablet (20 mg total) by mouth daily. --- Office visit needed for further refills 90 tablet 0  . sertraline (ZOLOFT) 50 MG tablet Take 1 tablet (50 mg total) by mouth daily. -- Office visit needed for further refills 90 tablet 0   No current facility-administered medications on file prior to visit.     Past Medical History:  Diagnosis Date  . Arthritis   . Cholelithiasis   . Hepatic steatosis   . Hyperlipemia   . Hypertension   . Hypothyroidism   . PONV (postoperative nausea and vomiting)     Past Surgical History:  Procedure Laterality Date  . ABDOMINAL HYSTERECTOMY     @ age 43, due to fibroids   . COLONOSCOPY W/ POLYPECTOMY  2010    Dr.Jacobs, due 2015   . DILATION AND CURETTAGE OF UTERUS    . MOHS SURGERY  2004  Basal Cell   . TONSILLECTOMY    . TOTAL KNEE ARTHROPLASTY  09/22/2011   Dr Nicholes Stairs, Left;    Social History   Social History  . Marital status: Married    Spouse name: N/A  . Number of children: N/A  . Years of education: N/A   Social History Main Topics  . Smoking status: Never Smoker  . Smokeless tobacco: Never Used  . Alcohol use Yes     Comment: rare  . Drug use: No  . Sexual activity: Not Asked   Other Topics Concern  . None   Social History Narrative   Not exercising regularly    Family History  Problem Relation Age of Onset  . Diabetes Mother   . Leukemia Mother   . Alcohol abuse Father   . Lung  cancer Father        smoker  . Diabetes Brother   . Heart attack Maternal Uncle        X3 ; MIs in 69s & 33s  . Stroke Maternal Uncle        in his 29s    Review of Systems  Constitutional: Negative for chills and fever.  Eyes: Negative for visual disturbance.  Respiratory: Negative for cough, shortness of breath and wheezing.   Cardiovascular: Negative for chest pain, palpitations and leg swelling.  Gastrointestinal: Negative for abdominal pain, blood in stool, constipation, diarrhea and nausea.       No gerd  Genitourinary: Negative for dysuria and hematuria.  Musculoskeletal: Positive for arthralgias (knees). Negative for back pain.  Skin: Negative for color change and rash.  Neurological: Negative for light-headedness and headaches.  Psychiatric/Behavioral: Negative for dysphoric mood. The patient is not nervous/anxious.        Objective:   Vitals:   06/12/16 1504  BP: 136/88  Pulse: 78  Resp: 16  Temp: 98.1 F (36.7 C)   Filed Weights   06/12/16 1504  Weight: 238 lb (108 kg)   Body mass index is 38.41 kg/m.  Wt Readings from Last 3 Encounters:  06/12/16 238 lb (108 kg)  03/16/16 234 lb 4 oz (106.3 kg)  03/06/16 235 lb (106.6 kg)     Physical Exam Constitutional: She appears well-developed and well-nourished. No distress.  HENT:  Head: Normocephalic and atraumatic.  Right Ear: External ear normal. Normal ear canal and TM Left Ear: External ear normal.  Normal ear canal and TM Mouth/Throat: Oropharynx is clear and moist.  Eyes: Conjunctivae and EOM are normal.  Neck: Neck supple. No tracheal deviation present. No thyromegaly present.  No carotid bruit  Cardiovascular: Normal rate, regular rhythm and normal heart sounds.   No murmur heard.  No edema. Pulmonary/Chest: Effort normal and breath sounds normal. No respiratory distress. She has no wheezes. She has no rales.  Breast: deferred to Gyn - up to date Abdominal: Soft. She exhibits no distension.  There is no tenderness.  Lymphadenopathy: She has no cervical adenopathy.  Skin: Skin is warm and dry. She is not diaphoretic.  Psychiatric: She has a normal mood and affect. Her behavior is normal.         Assessment & Plan:   Physical exam: Screening blood work ordered Immunizations  Up to date  - discussed shingrix Colonoscopy   Up to date  Mammogram   Up to date  Gyn -  Up to date - last seen 07/2015 Eye exams  Up to date  EKG    done by cardio 02/2016 Exercise -  none - will restart regular exercise Weight  -  Work on weight loss Skin   No concerns Substance abuse  none  See Problem List for Assessment and Plan of chronic medical problems.

## 2016-06-11 NOTE — Patient Instructions (Addendum)
All other Health Maintenance issues reviewed.   All recommended immunizations and age-appropriate screenings are up-to-date or discussed.  No immunizations administered today. Consider the shingles vaccine.   Medications reviewed and updated.  Stop the hydrochlorothiazide.    Please followup in 6 months    Health Maintenance, Female Adopting a healthy lifestyle and getting preventive care can go a long way to promote health and wellness. Talk with your health care provider about what schedule of regular examinations is right for you. This is a good chance for you to check in with your provider about disease prevention and staying healthy. In between checkups, there are plenty of things you can do on your own. Experts have done a lot of research about which lifestyle changes and preventive measures are most likely to keep you healthy. Ask your health care provider for more information. Weight and diet Eat a healthy diet  Be sure to include plenty of vegetables, fruits, low-fat dairy products, and lean protein.  Do not eat a lot of foods high in solid fats, added sugars, or salt.  Get regular exercise. This is one of the most important things you can do for your health.  Most adults should exercise for at least 150 minutes each week. The exercise should increase your heart rate and make you sweat (moderate-intensity exercise).  Most adults should also do strengthening exercises at least twice a week. This is in addition to the moderate-intensity exercise. Maintain a healthy weight  Body mass index (BMI) is a measurement that can be used to identify possible weight problems. It estimates body fat based on height and weight. Your health care provider can help determine your BMI and help you achieve or maintain a healthy weight.  For females 61 years of age and older:  A BMI below 18.5 is considered underweight.  A BMI of 18.5 to 24.9 is normal.  A BMI of 25 to 29.9 is considered  overweight.  A BMI of 30 and above is considered obese. Watch levels of cholesterol and blood lipids  You should start having your blood tested for lipids and cholesterol at 61 years of age, then have this test every 5 years.  You may need to have your cholesterol levels checked more often if:  Your lipid or cholesterol levels are high.  You are older than 61 years of age.  You are at high risk for heart disease. Cancer screening Lung Cancer  Lung cancer screening is recommended for adults 55-58 years old who are at high risk for lung cancer because of a history of smoking.  A yearly low-dose CT scan of the lungs is recommended for people who:  Currently smoke.  Have quit within the past 15 years.  Have at least a 30-pack-year history of smoking. A pack year is smoking an average of one pack of cigarettes a day for 1 year.  Yearly screening should continue until it has been 15 years since you quit.  Yearly screening should stop if you develop a health problem that would prevent you from having lung cancer treatment. Breast Cancer  Practice breast self-awareness. This means understanding how your breasts normally appear and feel.  It also means doing regular breast self-exams. Let your health care provider know about any changes, no matter how small.  If you are in your 20s or 30s, you should have a clinical breast exam (CBE) by a health care provider every 1-3 years as part of a regular health exam.  If you  are 88 or older, have a CBE every year. Also consider having a breast X-ray (mammogram) every year.  If you have a family history of breast cancer, talk to your health care provider about genetic screening.  If you are at high risk for breast cancer, talk to your health care provider about having an MRI and a mammogram every year.  Breast cancer gene (BRCA) assessment is recommended for women who have family members with BRCA-related cancers. BRCA-related cancers  include:  Breast.  Ovarian.  Tubal.  Peritoneal cancers.  Results of the assessment will determine the need for genetic counseling and BRCA1 and BRCA2 testing. Cervical Cancer  Your health care provider may recommend that you be screened regularly for cancer of the pelvic organs (ovaries, uterus, and vagina). This screening involves a pelvic examination, including checking for microscopic changes to the surface of your cervix (Pap test). You may be encouraged to have this screening done every 3 years, beginning at age 41.  For women ages 40-65, health care providers may recommend pelvic exams and Pap testing every 3 years, or they may recommend the Pap and pelvic exam, combined with testing for human papilloma virus (HPV), every 5 years. Some types of HPV increase your risk of cervical cancer. Testing for HPV may also be done on women of any age with unclear Pap test results.  Other health care providers may not recommend any screening for nonpregnant women who are considered low risk for pelvic cancer and who do not have symptoms. Ask your health care provider if a screening pelvic exam is right for you.  If you have had past treatment for cervical cancer or a condition that could lead to cancer, you need Pap tests and screening for cancer for at least 20 years after your treatment. If Pap tests have been discontinued, your risk factors (such as having a new sexual partner) need to be reassessed to determine if screening should resume. Some women have medical problems that increase the chance of getting cervical cancer. In these cases, your health care provider may recommend more frequent screening and Pap tests. Colorectal Cancer  This type of cancer can be detected and often prevented.  Routine colorectal cancer screening usually begins at 61 years of age and continues through 61 years of age.  Your health care provider may recommend screening at an earlier age if you have risk factors  for colon cancer.  Your health care provider may also recommend using home test kits to check for hidden blood in the stool.  A small camera at the end of a tube can be used to examine your colon directly (sigmoidoscopy or colonoscopy). This is done to check for the earliest forms of colorectal cancer.  Routine screening usually begins at age 15.  Direct examination of the colon should be repeated every 5-10 years through 61 years of age. However, you may need to be screened more often if early forms of precancerous polyps or small growths are found. Skin Cancer  Check your skin from head to toe regularly.  Tell your health care provider about any new moles or changes in moles, especially if there is a change in a mole's shape or color.  Also tell your health care provider if you have a mole that is larger than the size of a pencil eraser.  Always use sunscreen. Apply sunscreen liberally and repeatedly throughout the day.  Protect yourself by wearing long sleeves, pants, a wide-brimmed hat, and sunglasses whenever you are  outside. Heart disease, diabetes, and high blood pressure  High blood pressure causes heart disease and increases the risk of stroke. High blood pressure is more likely to develop in:  People who have blood pressure in the high end of the normal range (130-139/85-89 mm Hg).  People who are overweight or obese.  People who are African American.  If you are 68-15 years of age, have your blood pressure checked every 3-5 years. If you are 5 years of age or older, have your blood pressure checked every year. You should have your blood pressure measured twice-once when you are at a hospital or clinic, and once when you are not at a hospital or clinic. Record the average of the two measurements. To check your blood pressure when you are not at a hospital or clinic, you can use:  An automated blood pressure machine at a pharmacy.  A home blood pressure monitor.  If you  are between 87 years and 68 years old, ask your health care provider if you should take aspirin to prevent strokes.  Have regular diabetes screenings. This involves taking a blood sample to check your fasting blood sugar level.  If you are at a normal weight and have a low risk for diabetes, have this test once every three years after 61 years of age.  If you are overweight and have a high risk for diabetes, consider being tested at a younger age or more often. Preventing infection Hepatitis B  If you have a higher risk for hepatitis B, you should be screened for this virus. You are considered at high risk for hepatitis B if:  You were born in a country where hepatitis B is common. Ask your health care provider which countries are considered high risk.  Your parents were born in a high-risk country, and you have not been immunized against hepatitis B (hepatitis B vaccine).  You have HIV or AIDS.  You use needles to inject street drugs.  You live with someone who has hepatitis B.  You have had sex with someone who has hepatitis B.  You get hemodialysis treatment.  You take certain medicines for conditions, including cancer, organ transplantation, and autoimmune conditions. Hepatitis C  Blood testing is recommended for:  Everyone born from [birth year] through 1965.  Anyone with known risk factors for hepatitis C. Sexually transmitted infections (STIs)  You should be screened for sexually transmitted infections (STIs) including gonorrhea and chlamydia if:  You are sexually active and are younger than 61 years of age.  You are older than 61 years of age and your health care provider tells you that you are at risk for this type of infection.  Your sexual activity has changed since you were last screened and you are at an increased risk for chlamydia or gonorrhea. Ask your health care provider if you are at risk.  If you do not have HIV, but are at risk, it may be recommended that you  take a prescription medicine daily to prevent HIV infection. This is called pre-exposure prophylaxis (PrEP). You are considered at risk if:  You are sexually active and do not regularly use condoms or know the HIV status of your partner(s).  You take drugs by injection.  You are sexually active with a partner who has HIV. Talk with your health care provider about whether you are at high risk of being infected with HIV. If you choose to begin PrEP, you should first be tested for HIV. You should  then be tested every 3 months for as long as you are taking PrEP. Pregnancy  If you are premenopausal and you may become pregnant, ask your health care provider about preconception counseling.  If you may become pregnant, take 400 to 800 micrograms (mcg) of folic acid every day.  If you want to prevent pregnancy, talk to your health care provider about birth control (contraception). Osteoporosis and menopause  Osteoporosis is a disease in which the bones lose minerals and strength with aging. This can result in serious bone fractures. Your risk for osteoporosis can be identified using a bone density scan.  If you are 77 years of age or older, or if you are at risk for osteoporosis and fractures, ask your health care provider if you should be screened.  Ask your health care provider whether you should take a calcium or vitamin D supplement to lower your risk for osteoporosis.  Menopause may have certain physical symptoms and risks.  Hormone replacement therapy may reduce some of these symptoms and risks. Talk to your health care provider about whether hormone replacement therapy is right for you. Follow these instructions at home:  Schedule regular health, dental, and eye exams.  Stay current with your immunizations.  Do not use any tobacco products including cigarettes, chewing tobacco, or electronic cigarettes.  If you are pregnant, do not drink alcohol.  If you are breastfeeding, limit  how much and how often you drink alcohol.  Limit alcohol intake to no more than 1 drink per day for nonpregnant women. One drink equals 12 ounces of beer, 5 ounces of wine, or 1 ounces of hard liquor.  Do not use street drugs.  Do not share needles.  Ask your health care provider for help if you need support or information about quitting drugs.  Tell your health care provider if you often feel depressed.  Tell your health care provider if you have ever been abused or do not feel safe at home. This information is not intended to replace advice given to you by your health care provider. Make sure you discuss any questions you have with your health care provider. Document Released: 07/25/2010 Document Revised: 06/17/2015 Document Reviewed: 10/13/2014 Elsevier Interactive Patient Education  2017 [employer].

## 2016-06-12 ENCOUNTER — Encounter: Payer: Self-pay | Admitting: Internal Medicine

## 2016-06-12 ENCOUNTER — Ambulatory Visit (INDEPENDENT_AMBULATORY_CARE_PROVIDER_SITE_OTHER): Payer: 59 | Admitting: Internal Medicine

## 2016-06-12 VITALS — BP 136/88 | HR 78 | Temp 98.1°F | Resp 16 | Ht 66.0 in | Wt 238.0 lb

## 2016-06-12 DIAGNOSIS — I10 Essential (primary) hypertension: Secondary | ICD-10-CM

## 2016-06-12 DIAGNOSIS — R7303 Prediabetes: Secondary | ICD-10-CM | POA: Diagnosis not present

## 2016-06-12 DIAGNOSIS — F411 Generalized anxiety disorder: Secondary | ICD-10-CM | POA: Diagnosis not present

## 2016-06-12 DIAGNOSIS — E038 Other specified hypothyroidism: Secondary | ICD-10-CM

## 2016-06-12 DIAGNOSIS — E782 Mixed hyperlipidemia: Secondary | ICD-10-CM | POA: Diagnosis not present

## 2016-06-12 DIAGNOSIS — Z0000 Encounter for general adult medical examination without abnormal findings: Secondary | ICD-10-CM

## 2016-06-12 NOTE — Assessment & Plan Note (Addendum)
Borderline high here today, but she feels it has been lower other places Will d/c hctz since on spironolactone  Continue losartan has been well controlled She will monitor Increase exercise, work on weight loss

## 2016-06-12 NOTE — Assessment & Plan Note (Signed)
tsh in normal range Continue current dose of medication 

## 2016-06-12 NOTE — Assessment & Plan Note (Signed)
Lipids controlled Continue crestor 20 mg

## 2016-06-12 NOTE — Assessment & Plan Note (Signed)
Controlled, stable Continue current dose of medication  

## 2016-06-12 NOTE — Assessment & Plan Note (Signed)
a1c 6.2 Low sugar / carb diet Stressed regular exercise, weight loss

## 2016-07-14 ENCOUNTER — Other Ambulatory Visit: Payer: Self-pay | Admitting: Internal Medicine

## 2016-07-24 ENCOUNTER — Other Ambulatory Visit: Payer: Self-pay | Admitting: Internal Medicine

## 2016-08-03 ENCOUNTER — Other Ambulatory Visit: Payer: Self-pay | Admitting: Internal Medicine

## 2016-08-30 DIAGNOSIS — L570 Actinic keratosis: Secondary | ICD-10-CM | POA: Diagnosis not present

## 2016-08-30 DIAGNOSIS — Z08 Encounter for follow-up examination after completed treatment for malignant neoplasm: Secondary | ICD-10-CM | POA: Diagnosis not present

## 2016-08-30 DIAGNOSIS — L650 Telogen effluvium: Secondary | ICD-10-CM | POA: Diagnosis not present

## 2016-08-30 DIAGNOSIS — Z85828 Personal history of other malignant neoplasm of skin: Secondary | ICD-10-CM | POA: Diagnosis not present

## 2016-09-02 ENCOUNTER — Other Ambulatory Visit: Payer: Self-pay | Admitting: Internal Medicine

## 2016-09-05 ENCOUNTER — Encounter: Payer: Self-pay | Admitting: Internal Medicine

## 2016-09-05 ENCOUNTER — Ambulatory Visit (INDEPENDENT_AMBULATORY_CARE_PROVIDER_SITE_OTHER): Payer: 59 | Admitting: Internal Medicine

## 2016-09-05 VITALS — BP 122/74 | HR 90 | Temp 98.9°F | Resp 16 | Wt 240.0 lb

## 2016-09-05 DIAGNOSIS — J029 Acute pharyngitis, unspecified: Secondary | ICD-10-CM | POA: Diagnosis not present

## 2016-09-05 DIAGNOSIS — J209 Acute bronchitis, unspecified: Secondary | ICD-10-CM

## 2016-09-05 LAB — POCT RAPID STREP A (OFFICE): RAPID STREP A SCREEN: NEGATIVE

## 2016-09-05 MED ORDER — AZITHROMYCIN 250 MG PO TABS: ORAL_TABLET | ORAL | 0 refills | Status: DC

## 2016-09-05 NOTE — Progress Notes (Signed)
Subjective:    Patient ID: [name] [name], female    DOB: Sep 24, 1955, 61 y.o.   MRN: [id number]  HPI She is here for an acute visit for cold symptoms.   Her symptoms started 1 week ago.   She is experiencing runny nose, congestion, sore throat, PND, sinus pain, cough that is productive of discolored mucus, nausea and headaches.  She denies fever, chills, ear pain, chest tightness, sob, diarrhea and muscle aches.  She has tried taking sinus medication, tylenol or advil with some improvement in symptoms.   Medications and allergies reviewed with patient and updated if appropriate.  Patient Active Problem List   Diagnosis Date Noted  . Herniated cervical disc 06/17/2015  . Prediabetes 06/17/2015  . Palpitations 06/17/2015  . Anxiety state 03/28/2015  . Nonspecific abnormal electrocardiogram (ECG) (EKG) 03/20/2014  . OSA (obstructive sleep apnea) 09/06/2012  . Allergic rhinoconjunctivitis, seasonal and perennial 07/02/2012  . History of colonic polyps 02/09/2010  . METABOLIC SYNDROME X 25/36/6440  . GERD 09/11/2008  . CHOLELITHIASIS 09/11/2008  . FATTY LIVER DISEASE, HX OF 09/11/2008  . Hypothyroidism 09/11/2007  . Hyperlipidemia 09/11/2007  . Essential hypertension 09/11/2007  . Memory loss 09/11/2007  . CARCINOMA, BASAL CELL, HX OF 05/09/2006    Current Outpatient Prescriptions on File Prior to Visit  Medication Sig Dispense Refill  . levothyroxine (SYNTHROID, LEVOTHROID) 100 MCG tablet TAKE ONE TABLET BY MOUTH EVERY MORNING BEFORE BREAKFAST 90 tablet 1  . losartan (COZAAR) 100 MG tablet TAKE ONE (1) TABLET BY MOUTH EVERY DAY 90 tablet 3  . rosuvastatin (CRESTOR) 20 MG tablet TAKE ONE (1) TABLET EACH DAY 90 tablet 0  . sertraline (ZOLOFT) 50 MG tablet TAKE ONE (1) TABLET BY MOUTH EVERY DAY 90 tablet 1  . spironolactone (ALDACTONE) 50 MG tablet Take 50 mg by mouth daily.     No current facility-administered medications on file prior to visit.     Past Medical  History:  Diagnosis Date  . Arthritis   . Cholelithiasis   . Hepatic steatosis   . Hyperlipemia   . Hypertension   . Hypothyroidism   . PONV (postoperative nausea and vomiting)     Past Surgical History:  Procedure Laterality Date  . ABDOMINAL HYSTERECTOMY     @ age 89, due to fibroids   . COLONOSCOPY W/ POLYPECTOMY  2010    Dr.Jacobs, due 2015   . DILATION AND CURETTAGE OF UTERUS    . MOHS SURGERY  2004   Basal Cell   . TONSILLECTOMY    . TOTAL KNEE ARTHROPLASTY  09/22/2011   Dr Nicholes Stairs, Left;    Social History   Social History  . Marital status: Married    Spouse name: N/A  . Number of children: N/A  . Years of education: N/A   Social History Main Topics  . Smoking status: Never Smoker  . Smokeless tobacco: Never Used  . Alcohol use Yes     Comment: rare  . Drug use: No  . Sexual activity: Not on file   Other Topics Concern  . Not on file   Social History Narrative   Not exercising regularly    Family History  Problem Relation Age of Onset  . Diabetes Mother   . Leukemia Mother   . Alcohol abuse Father   . Lung cancer Father        smoker  . Diabetes Brother   . Heart attack Maternal Uncle        X3 ;  MIs in 60s & 50s  . Stroke Maternal Uncle        in his 21s    Review of Systems  Constitutional: Negative for chills and fever.  HENT: Positive for congestion, postnasal drip, sinus pain, sinus pressure and sore throat. Negative for ear pain.   Respiratory: Positive for cough (productive today - discolored - worse in am). Negative for chest tightness, shortness of breath and wheezing.   Cardiovascular: Negative for chest pain.  Gastrointestinal: Positive for nausea. Negative for diarrhea.  Musculoskeletal: Negative for myalgias.  Neurological: Positive for headaches. Negative for dizziness and light-headedness.       Objective:   Vitals:   09/05/16 1134  BP: 122/74  Pulse: 90  Resp: 16  Temp: 98.9 F (37.2 C)  SpO2: 97%   Filed Weights    09/05/16 1134  Weight: 240 lb (108.9 kg)   Body mass index is 38.74 kg/m.  Wt Readings from Last 3 Encounters:  09/05/16 240 lb (108.9 kg)  06/12/16 238 lb (108 kg)  03/16/16 234 lb 4 oz (106.3 kg)     Physical Exam GENERAL APPEARANCE: Appears stated age, well appearing, NAD EYES: conjunctiva clear, no icterus HEENT: bilateral tympanic membranes and ear canals normal, oropharynx with moderate erythema, no thyromegaly, trachea midline, no cervical or supraclavicular lymphadenopathy LUNGS: Clear to auscultation without wheeze or crackles, unlabored breathing, good air entry bilaterally HEART: Normal S1,S2 without murmurs EXTREMITIES: Without clubbing, cyanosis, or edema        Assessment & Plan:   See Problem List for Assessment and Plan of chronic medical problems.

## 2016-09-05 NOTE — Patient Instructions (Addendum)
  Medications reviewed and updated.  Changes include taking an antibiotic, zpak, for your infection.  Your prescription(s) have been submitted to your pharmacy. Please take as directed and contact our office if you believe you are having problem(s) with the medication(s).   Your prescription(s) have been submitted to your pharmacy or been printed and provided for you. Please take as directed and contact our office if you believe you are having problem(s) with the medication(s) or have any questions.  If your symptoms worsen or fail to improve, please contact our office for further instruction, or in case of emergency go directly to the emergency room at the closest medical facility.   General Recommendations:    Please drink plenty of fluids.  Get plenty of rest   Sleep in humidified air  Use saline nasal sprays  Netti pot  OTC Medications:  Decongestants - helps relieve congestion   Flonase (generic fluticasone) or Nasacort (generic triamcinolone) - please make sure to use the "cross-over" technique at a 45 degree angle towards the opposite eye as opposed to straight up the nasal passageway.   Sudafed (generic pseudoephedrine - Note this is the one that is available behind the pharmacy counter); Products with phenylephrine (-PE) may also be used but is often not as effective as pseudoephedrine.   If you have HIGH BLOOD PRESSURE - Coricidin HBP; AVOID any product that is -D as this contains pseudoephedrine which may increase your blood pressure.  Afrin (oxymetazoline) every 6-8 hours for up to 3 days.  Allergies - helps relieve runny nose, itchy eyes and sneezing   Claritin (generic loratidine), Allegra (fexofenidine), or Zyrtec (generic cyrterizine) for runny nose. These medications should not cause drowsiness.  Note - Benadryl (generic diphenhydramine) may be used however may cause drowsiness  Cough -   Delsym or Robitussin (generic  dextromethorphan)  Expectorants - helps loosen mucus to ease removal   Mucinex (generic guaifenesin) as directed on the package.  Headaches / General Aches   Tylenol (generic acetaminophen) - DO NOT EXCEED 3 grams (3,000 mg) in a 24 hour time period  Advil/Motrin (generic ibuprofen)  Sore Throat -   Salt water gargle   Chloraseptic (generic benzocaine) spray or lozenges / Sucrets (generic dyclonine)

## 2016-09-05 NOTE — Assessment & Plan Note (Signed)
Rapid strep negative.

## 2016-09-05 NOTE — Assessment & Plan Note (Signed)
Symptoms consistent with bacterial bronchitis Start a zpak which always works well for her Continue otc cold medications for symptoms relief  Call if no improvement

## 2016-10-26 ENCOUNTER — Telehealth: Payer: Self-pay | Admitting: Nurse Practitioner

## 2016-10-26 ENCOUNTER — Ambulatory Visit (INDEPENDENT_AMBULATORY_CARE_PROVIDER_SITE_OTHER)
Admission: RE | Admit: 2016-10-26 | Discharge: 2016-10-26 | Disposition: A | Discharge status: Home / Self Care | Payer: BLUE CROSS/BLUE SHIELD | Source: Ambulatory Visit | Attending: Nurse Practitioner | Admitting: Nurse Practitioner

## 2016-10-26 ENCOUNTER — Encounter: Payer: Self-pay | Admitting: Nurse Practitioner

## 2016-10-26 ENCOUNTER — Ambulatory Visit (INDEPENDENT_AMBULATORY_CARE_PROVIDER_SITE_OTHER): Payer: BLUE CROSS/BLUE SHIELD | Admitting: Nurse Practitioner

## 2016-10-26 VITALS — BP 124/80 | HR 86 | Temp 98.1°F | Ht 66.0 in | Wt 241.0 lb

## 2016-10-26 DIAGNOSIS — J209 Acute bronchitis, unspecified: Secondary | ICD-10-CM

## 2016-10-26 IMAGING — DX DG CHEST 2V
2 series · 2 of 2 positions shown · non-contrast
Comparison: [DATE] radiography.  [DATE] CT.

CLINICAL DATA: Cough, chest congestion and shortness of breath, 2
weeks duration.

EXAM:
CHEST  2 VIEW

[chest pa]
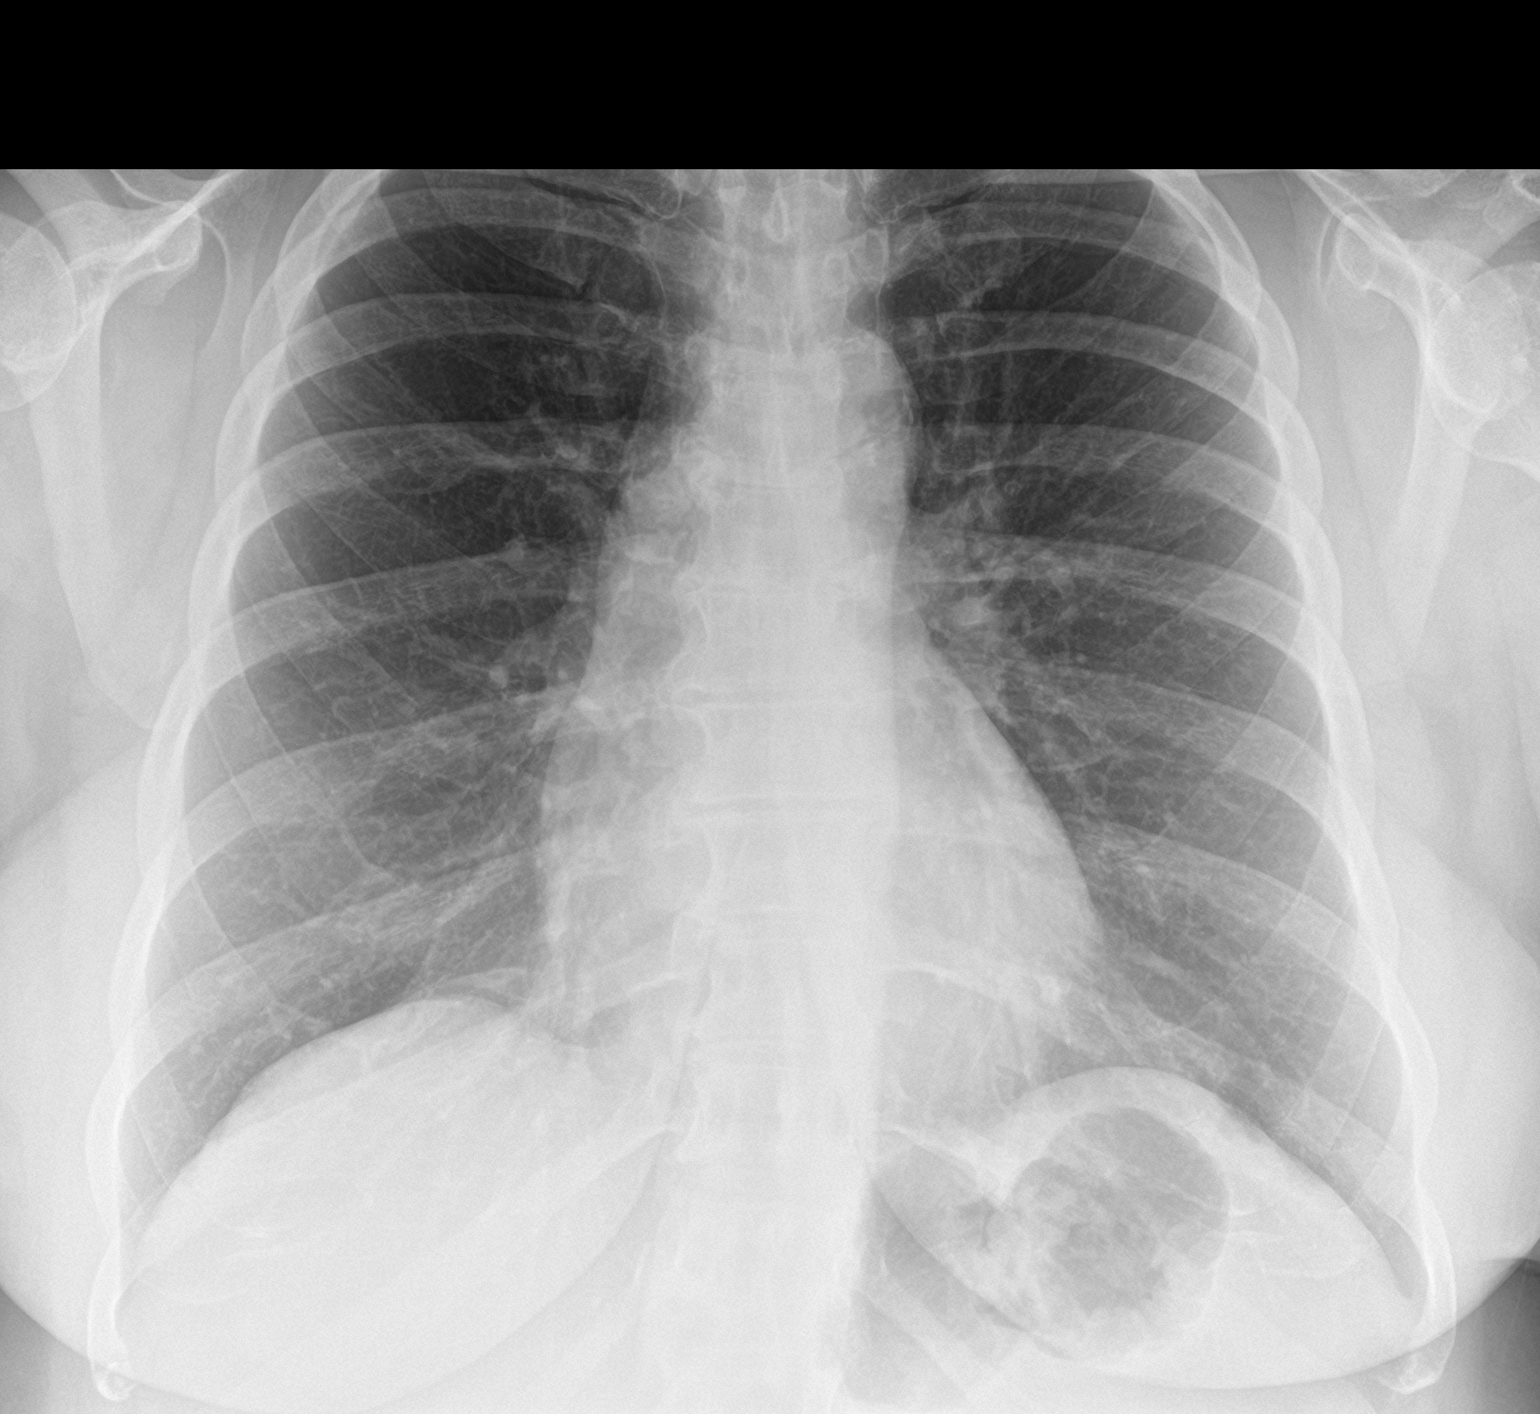

[chest lat]
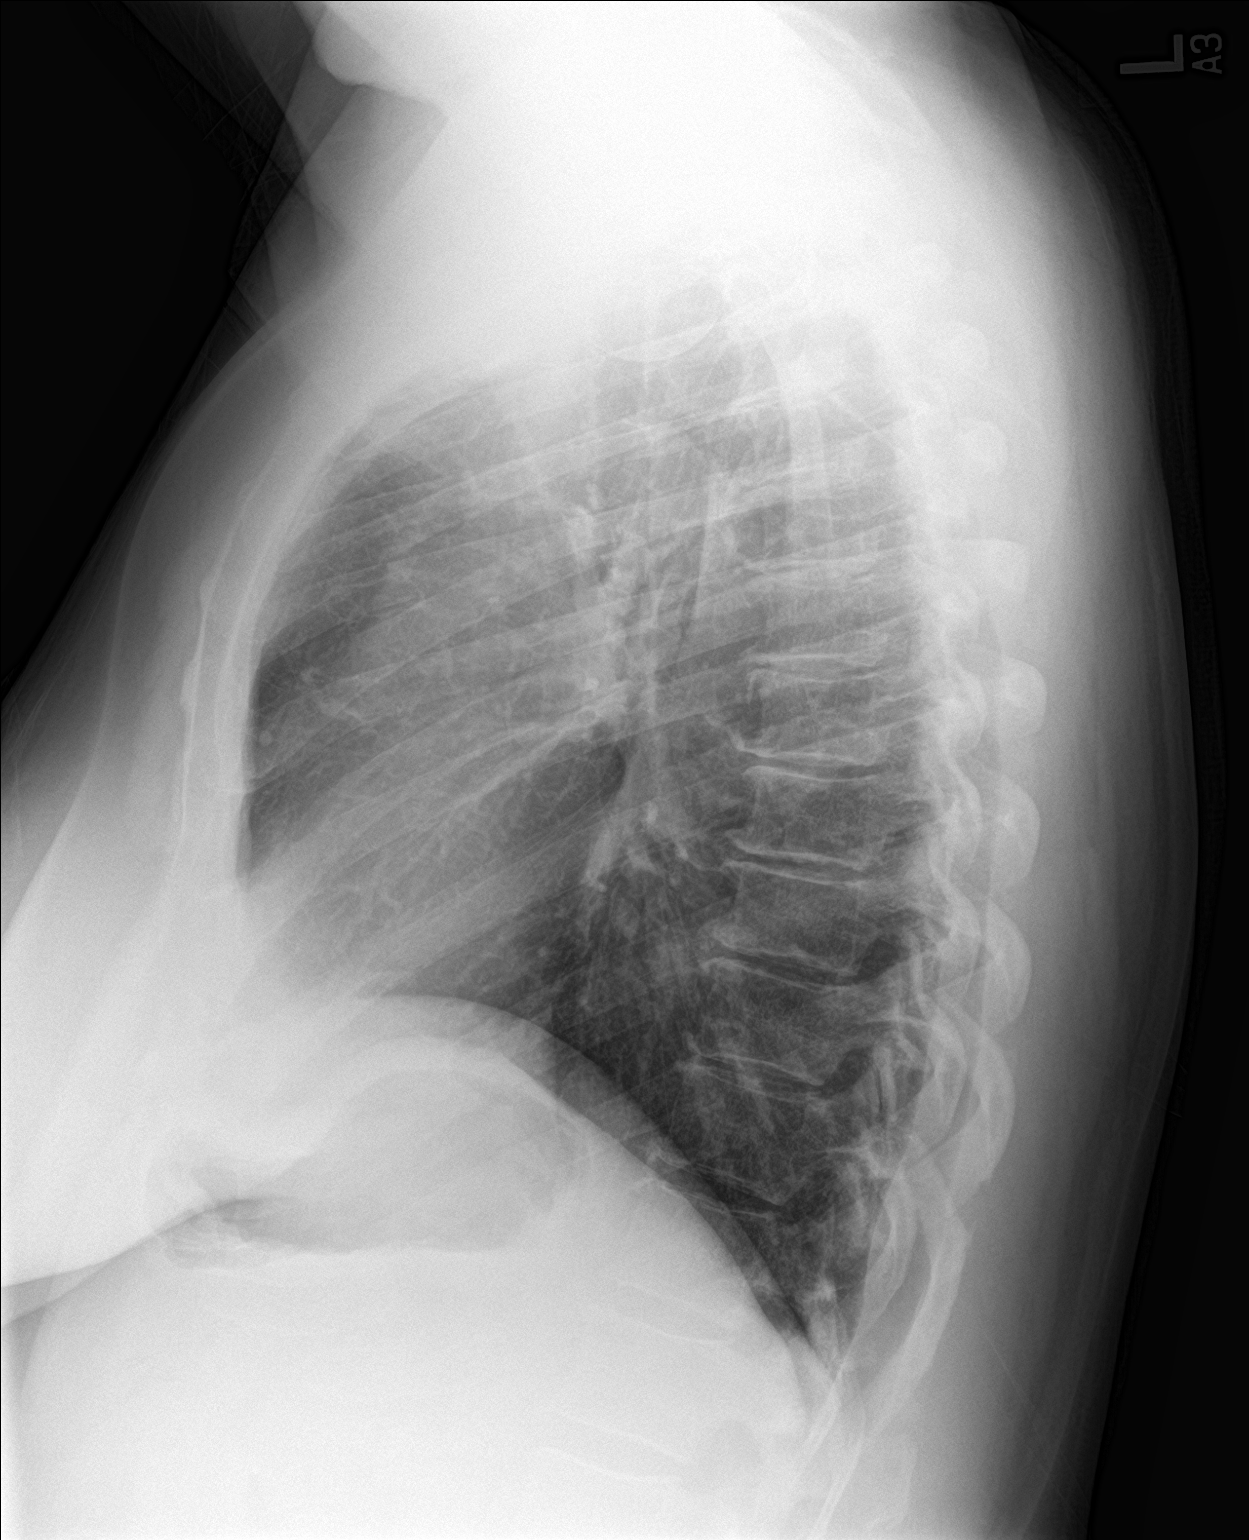

[2 of 2 positions shown; findings below may reference images not displayed]

FINDINGS: Heart size is normal. Mediastinal shadows are normal except for mild
aortic atherosclerotic calcification. The pulmonary vascularity is
normal. The lungs are clear except for a 3 mm calcified granuloma in
the anterior right middle lobe. No effusions. Ordinary mild
degenerative changes affect the spine.
IMPRESSION: No active disease. Mild aortic atherosclerotic calcification.
Chronic right middle lobe calcified granuloma.

## 2016-10-26 MED ORDER — ALBUTEROL SULFATE HFA 108 (90 BASE) MCG/ACT IN AERS: 1.0000 | INHALATION_SPRAY | Freq: Four times a day (QID) | RESPIRATORY_TRACT | 0 refills | Status: DC | PRN

## 2016-10-26 MED ORDER — FLUTICASONE PROPIONATE 50 MCG/ACT NA SUSP: 2.0000 | Freq: Every day | NASAL | 0 refills | Status: DC

## 2016-10-26 MED ORDER — PROMETHAZINE-DM 6.25-15 MG/5ML PO SYRP: 5.0000 mL | ORAL_SOLUTION | Freq: Three times a day (TID) | ORAL | 0 refills | Status: DC | PRN

## 2016-10-26 MED ORDER — MONTELUKAST SODIUM 10 MG PO TABS: 10.0000 mg | ORAL_TABLET | Freq: Every day | ORAL | 1 refills | Status: DC

## 2016-10-26 NOTE — Telephone Encounter (Signed)
Pt called back about her chest xray. I gave her [name] [name]'s response. She expressed understanding and did not have any questions.

## 2016-10-26 NOTE — Patient Instructions (Addendum)
Encourage adequate oral hydration.  Normal CXR. Treat for acute bronchitis and allergic rhinitis. No oral abx needed at this time.

## 2016-10-26 NOTE — Progress Notes (Signed)
Subjective:  Patient ID: [name] [name], female    DOB: 1955/04/26  Age: 61 y.o. MRN: [id number]  CC: Cough (coughing,congestion,stomach issue--diarrhea/ going on for 2 wks. flu shot?)  Cough  This is a recurrent problem. The current episode started more than 1 month ago. The problem has been waxing and waning. The problem occurs constantly. The cough is productive of sputum. Associated symptoms include ear congestion, nasal congestion, postnasal drip, rhinorrhea, a sore throat, shortness of breath and wheezing. Pertinent negatives include no chest pain, chills, fever, headaches, sweats or weight loss. The symptoms are aggravated by lying down. She has tried OTC cough suppressant and prescription cough suppressant for the symptoms. The treatment provided no relief. Her past medical history is significant for bronchitis and environmental allergies.  denies any GERD. She does not want any oral or IM steriod due to side effects (severe irritability per patient).  Outpatient Medications Prior to Visit  Medication Sig Dispense Refill  . levothyroxine (SYNTHROID, LEVOTHROID) 100 MCG tablet TAKE ONE TABLET BY MOUTH EVERY MORNING BEFORE BREAKFAST 90 tablet 1  . losartan (COZAAR) 100 MG tablet TAKE ONE (1) TABLET BY MOUTH EVERY DAY 90 tablet 3  . rosuvastatin (CRESTOR) 20 MG tablet TAKE ONE (1) TABLET EACH DAY 90 tablet 0  . sertraline (ZOLOFT) 50 MG tablet TAKE ONE (1) TABLET BY MOUTH EVERY DAY 90 tablet 1  . spironolactone (ALDACTONE) 50 MG tablet Take 50 mg by mouth daily.    Marland Kitchen azithromycin (ZITHROMAX) 250 MG tablet Take two tabs the first day and then one tab daily for four days 6 tablet 0   No facility-administered medications prior to visit.     ROS See HPI  Objective:  BP 124/80   Pulse 86   Temp 98.1 F (36.7 C)   Ht 5\' 6"  (1.676 m)   Wt 241 lb (109.3 kg)   SpO2 98%   BMI 38.90 kg/m   BP Readings from Last 3 Encounters:  10/26/16 124/80  09/05/16 122/74  06/12/16 136/88      Wt Readings from Last 3 Encounters:  10/26/16 241 lb (109.3 kg)  09/05/16 240 lb (108.9 kg)  06/12/16 238 lb (108 kg)    Physical Exam  Constitutional: She is oriented to person, place, and time. No distress.  HENT:  Right Ear: Tympanic membrane, external ear and ear canal normal.  Left Ear: Tympanic membrane and ear canal normal.  Nose: Mucosal edema and rhinorrhea present. Right sinus exhibits maxillary sinus tenderness and frontal sinus tenderness. Left sinus exhibits maxillary sinus tenderness and frontal sinus tenderness.  Mouth/Throat: Uvula is midline. No trismus in the jaw. Posterior oropharyngeal erythema present. No oropharyngeal exudate.  Neck: Normal range of motion. Neck supple.  Cardiovascular: Normal rate and regular rhythm.   Pulmonary/Chest: Effort normal and breath sounds normal. No respiratory distress. She has no wheezes. She has no rales.  Musculoskeletal: She exhibits no edema.  Lymphadenopathy:    She has no cervical adenopathy.  Neurological: She is alert and oriented to person, place, and time.  Vitals reviewed.   Lab Results  Component Value Date   WBC 7.1 06/09/2016   HGB 15.9 (H) 06/09/2016   HCT 46.9 (H) 06/09/2016   PLT 312.0 06/09/2016   GLUCOSE 125 (H) 06/09/2016   CHOL 160 06/09/2016   TRIG 216.0 (H) 06/09/2016   HDL 36.20 (L) 06/09/2016   LDLDIRECT 90.0 06/09/2016   LDLCALC 81 03/20/2014   ALT 23 06/09/2016   AST 21 06/09/2016  NA 139 06/09/2016   K 4.3 06/09/2016   CL 103 06/09/2016   CREATININE 1.04 06/09/2016   BUN 16 06/09/2016   CO2 30 06/09/2016   TSH 2.60 06/09/2016   INR 1.00 09/19/2011   HGBA1C 6.2 06/09/2016   MICROALBUR 1.0 02/27/2006    Dg Chest 2 View  Result Date: 12/30/2014 CLINICAL DATA:  Chest pain EXAM: CHEST  2 VIEW COMPARISON:  09/19/2011 FINDINGS: Normal heart size and mediastinal contours. No acute infiltrate or edema. No effusion or pneumothorax. No acute osseous findings. IMPRESSION: Negative chest.  Electronically Signed   By: Monte Fantasia M.D.   On: 12/30/2014 13:24    Assessment & Plan:   [name] [name] was seen today for cough.  Diagnoses and all orders for this visit:  Acute bronchitis, unspecified organism -     fluticasone (FLONASE) 50 MCG/ACT nasal spray; Place 2 sprays into both nostrils daily. -     montelukast (SINGULAIR) 10 MG tablet; Take 1 tablet (10 mg total) by mouth at bedtime. -     albuterol (PROVENTIL HFA;VENTOLIN HFA) 108 (90 Base) MCG/ACT inhaler; Inhale 1-2 puffs into the lungs every 6 (six) hours as needed. -     DG Chest 2 View; Future -     promethazine-dextromethorphan (PROMETHAZINE-DM) 6.25-15 MG/5ML syrup; Take 5 mLs by mouth 3 (three) times daily as needed for cough.   I have discontinued [name] [name]'s azithromycin. I am also having her start on fluticasone, montelukast, albuterol, and promethazine-dextromethorphan. Additionally, I am having her maintain her spironolactone, levothyroxine, losartan, sertraline, and rosuvastatin.  Meds ordered this encounter  Medications  . fluticasone (FLONASE) 50 MCG/ACT nasal spray    Sig: Place 2 sprays into both nostrils daily.    Dispense:  16 g    Refill:  0    Order Specific Question:   Supervising Provider    Answer:   Cassandria Anger [[id number]]  . montelukast (SINGULAIR) 10 MG tablet    Sig: Take 1 tablet (10 mg total) by mouth at bedtime.    Dispense:  30 tablet    Refill:  1    Order Specific Question:   Supervising Provider    Answer:   Cassandria Anger [[id number]]  . albuterol (PROVENTIL HFA;VENTOLIN HFA) 108 (90 Base) MCG/ACT inhaler    Sig: Inhale 1-2 puffs into the lungs every 6 (six) hours as needed.    Dispense:  1 Inhaler    Refill:  0    Order Specific Question:   Supervising Provider    Answer:   Cassandria Anger [[id number]]  . promethazine-dextromethorphan (PROMETHAZINE-DM) 6.25-15 MG/5ML syrup    Sig: Take 5 mLs by mouth 3 (three) times daily as needed for cough.    Dispense:  240 mL     Refill:  0    Order Specific Question:   Supervising Provider    Answer:   Cassandria Anger [[id number]]    Follow-up: No Follow-up on file.  Wilfred Lacy, NP

## 2016-10-31 ENCOUNTER — Telehealth: Payer: Self-pay | Admitting: Nurse Practitioner

## 2016-10-31 DIAGNOSIS — J209 Acute bronchitis, unspecified: Secondary | ICD-10-CM

## 2016-10-31 NOTE — Telephone Encounter (Signed)
Pt called and states her cough has worsened and wants to know if [name] [name] believe she needs to be on an abx. Please advise and call back

## 2016-10-31 NOTE — Telephone Encounter (Signed)
Pt stating she is having bad cough with yellow mucus coming out, headache and little chest tightness. Please advise  Ok to leave detail massage when call if no answer. [phone]

## 2016-11-01 MED ORDER — AZITHROMYCIN 250 MG PO TABS: 250.0000 mg | ORAL_TABLET | Freq: Every day | ORAL | 0 refills | Status: DC

## 2016-11-01 NOTE — Telephone Encounter (Signed)
Left detail massage infrom pt that abx sent to pharmacy.

## 2016-11-30 ENCOUNTER — Ambulatory Visit (INDEPENDENT_AMBULATORY_CARE_PROVIDER_SITE_OTHER): Payer: BLUE CROSS/BLUE SHIELD | Admitting: General Practice

## 2016-11-30 DIAGNOSIS — Z23 Encounter for immunization: Secondary | ICD-10-CM

## 2016-12-13 ENCOUNTER — Other Ambulatory Visit: Payer: Self-pay | Admitting: Internal Medicine

## 2016-12-13 ENCOUNTER — Ambulatory Visit: Payer: 59 | Admitting: Internal Medicine

## 2016-12-20 ENCOUNTER — Ambulatory Visit: Payer: 59 | Admitting: Internal Medicine

## 2017-01-04 ENCOUNTER — Other Ambulatory Visit: Payer: Self-pay | Admitting: Emergency Medicine

## 2017-01-04 MED ORDER — ROSUVASTATIN CALCIUM 20 MG PO TABS: ORAL_TABLET | ORAL | 1 refills | Status: DC

## 2017-01-04 MED ORDER — SERTRALINE HCL 50 MG PO TABS: ORAL_TABLET | ORAL | 1 refills | Status: DC

## 2017-01-04 MED ORDER — LEVOTHYROXINE SODIUM 100 MCG PO TABS: 100.0000 ug | ORAL_TABLET | Freq: Every day | ORAL | 1 refills | Status: DC

## 2017-01-04 MED ORDER — LOSARTAN POTASSIUM 100 MG PO TABS: ORAL_TABLET | ORAL | 1 refills | Status: DC

## 2017-01-25 ENCOUNTER — Encounter: Payer: Self-pay | Admitting: Internal Medicine

## 2017-01-25 ENCOUNTER — Ambulatory Visit: Payer: 59 | Admitting: Internal Medicine

## 2017-01-25 DIAGNOSIS — J209 Acute bronchitis, unspecified: Secondary | ICD-10-CM | POA: Diagnosis not present

## 2017-01-25 MED ORDER — PROMETHAZINE-DM 6.25-15 MG/5ML PO SYRP: 5.0000 mL | ORAL_SOLUTION | Freq: Three times a day (TID) | ORAL | 0 refills | Status: DC | PRN

## 2017-01-25 MED ORDER — AZITHROMYCIN 250 MG PO TABS: ORAL_TABLET | ORAL | 0 refills | Status: DC

## 2017-01-25 NOTE — Assessment & Plan Note (Signed)
Rx for azithromycin given doxy and pcn allergies. Rx for cough medicine promethazine/dextromorphan sent in today. Encouraged to continue claritin.

## 2017-01-25 NOTE — Patient Instructions (Signed)
We have sent in the z-pack. Take 2 pills today then 1 pill daily until gone.  We have refilled the cough medicine.

## 2017-01-25 NOTE — Progress Notes (Signed)
   Subjective:    Patient ID: [name] [name], female    DOB: 1955/09/25, 62 y.o.   MRN: [id number]  HPI The patient is a 62 YO female coming in for cough and cold symptoms going on for 2 weeks. Overall mildly improved. She has been taking claritin and mucinex which have helped mildly. Chest is sore from coughing. She is coughing up green phlegm. She has not used albuterol as she lost it. She does not have any more of the singulair or nasal spray and has not used it. No fevers or chills. Does have mild SOB, not limiting activities. Some sinus congestion but a little better. More in her lungs now.  Review of Systems  Constitutional: Positive for activity change. Negative for appetite change, chills, fatigue, fever and unexpected weight change.  HENT: Positive for congestion, postnasal drip, rhinorrhea and sinus pressure. Negative for ear discharge, ear pain, sinus pain, sneezing, sore throat, tinnitus, trouble swallowing and voice change.   Eyes: Negative.   Respiratory: Positive for cough and shortness of breath. Negative for chest tightness and wheezing.   Cardiovascular: Negative.   Gastrointestinal: Negative.   Musculoskeletal: Positive for myalgias.  Neurological: Negative.       Objective:   Physical Exam  Constitutional: She is oriented to person, place, and time. She appears well-developed and well-nourished.  HENT:  Head: Normocephalic and atraumatic.  Oropharynx with redness and clear drainage, nose with swollen turbinates, TMs normal bilaterally  Eyes: EOM are normal.  Neck: Normal range of motion. No thyromegaly present.  Cardiovascular: Normal rate and regular rhythm.  Pulmonary/Chest: Effort normal and breath sounds normal. No respiratory distress. She has no wheezes. She has no rales.  Abdominal: Soft.  Lymphadenopathy:    She has no cervical adenopathy.  Neurological: She is alert and oriented to person, place, and time.  Skin: Skin is warm and dry.   Vitals:   01/25/17 1400  BP: 122/82  Pulse: 100  Temp: 98.4 F (36.9 C)  TempSrc: Oral  SpO2: 99%  Weight: 240 lb (108.9 kg)  Height: 5\' 6"  (1.676 m)      Assessment & Plan:

## 2017-02-04 NOTE — Patient Instructions (Addendum)
  Test(s) ordered today. Your results will be released to [facility] (or called to you) after review, usually within 72hours after test completion. If any changes need to be made, you will be notified at that same time.  No immunizations administered today.   Medications reviewed and updated.  Changes include increasing sertraline to 100 mg daily.   Your prescription(s) have been submitted to your pharmacy. Please take as directed and contact our office if you believe you are having problem(s) with the medication(s).   Please followup in 6 months

## 2017-02-04 NOTE — Progress Notes (Signed)
Subjective:    Patient ID: [name] [name], female    DOB: Jun 03, 1955, 62 y.o.   MRN: [id number]  HPI The patient is here for follow up.  Hypertension: She is taking her medication daily. She is compliant with a low sodium diet.  She denies chest pain, palpitations, edema, shortness of breath and regular headaches. She is not exercising regularly.  She does not monitor her blood pressure at home.    Hyperlipidemia: She is taking her medication daily. She is compliant with a low fat/cholesterol diet. She is not exercising regularly. She denies myalgias.   Prediabetes:  She has not been compliant with a low sugar/carbohydrate diet.  She is not exercising regularly.  Hypothyroidism:  She is taking her medication daily.  She denies any recent changes in energy or weight that are unexplained.   Anxiety: She is taking her medication daily as prescribed. She denies any side effects from the medication. She is having increased anxiety because she has started a new job.  She has been having increased palpitations.  She would like to increase the sertraline.    Iron def:  She has a history of low iron and is taking iron daily.     Medications and allergies reviewed with patient and updated if appropriate.  Patient Active Problem List   Diagnosis Date Noted  . Acute bronchitis 09/05/2016  . Sore throat 09/05/2016  . Herniated cervical disc 06/17/2015  . Prediabetes 06/17/2015  . Palpitations 06/17/2015  . Anxiety state 03/28/2015  . Nonspecific abnormal electrocardiogram (ECG) (EKG) 03/20/2014  . OSA (obstructive sleep apnea) 09/06/2012  . Allergic rhinoconjunctivitis, seasonal and perennial 07/02/2012  . History of colonic polyps 02/09/2010  . METABOLIC SYNDROME X 58/85/0277  . GERD 09/11/2008  . CHOLELITHIASIS 09/11/2008  . FATTY LIVER DISEASE, HX OF 09/11/2008  . Hypothyroidism 09/11/2007  . Hyperlipidemia 09/11/2007  . Essential hypertension 09/11/2007  . Memory loss  09/11/2007  . CARCINOMA, BASAL CELL, HX OF 05/09/2006    Current Outpatient Medications on File Prior to Visit  Medication Sig Dispense Refill  . albuterol (PROVENTIL HFA;VENTOLIN HFA) 108 (90 Base) MCG/ACT inhaler Inhale 1-2 puffs into the lungs every 6 (six) hours as needed. 1 Inhaler 0  . levothyroxine (SYNTHROID, LEVOTHROID) 100 MCG tablet Take 1 tablet (100 mcg total) by mouth daily with breakfast. 90 tablet 1  . losartan (COZAAR) 100 MG tablet TAKE ONE (1) TABLET BY MOUTH EVERY DAY 90 tablet 1  . rosuvastatin (CRESTOR) 20 MG tablet TAKE ONE (1) TABLET BY MOUTH EVERY DAY 90 tablet 1  . sertraline (ZOLOFT) 50 MG tablet TAKE ONE (1) TABLET BY MOUTH EVERY DAY 90 tablet 1  . spironolactone (ALDACTONE) 50 MG tablet Take 50 mg by mouth daily.     No current facility-administered medications on file prior to visit.     Past Medical History:  Diagnosis Date  . Arthritis   . Cholelithiasis   . Hepatic steatosis   . Hyperlipemia   . Hypertension   . Hypothyroidism   . PONV (postoperative nausea and vomiting)     Past Surgical History:  Procedure Laterality Date  . ABDOMINAL HYSTERECTOMY     @ age 65, due to fibroids   . COLONOSCOPY W/ POLYPECTOMY  2010    Dr.Jacobs, due 2015   . DILATION AND CURETTAGE OF UTERUS    . MOHS SURGERY  2004   Basal Cell   . TONSILLECTOMY    . TOTAL KNEE ARTHROPLASTY  09/22/2011  Dr Nicholes Stairs, Left;    Social History   Socioeconomic History  . Marital status: Married    Spouse name: Not on file  . Number of children: Not on file  . Years of education: Not on file  . Highest education level: Not on file  Social Needs  . Financial resource strain: Not on file  . Food insecurity - worry: Not on file  . Food insecurity - inability: Not on file  . Transportation needs - medical: Not on file  . Transportation needs - non-medical: Not on file  Occupational History  . Not on file  Tobacco Use  . Smoking status: Never Smoker  . Smokeless tobacco:  Never Used  Substance and Sexual Activity  . Alcohol use: Yes    Comment: rare  . Drug use: No  . Sexual activity: Not on file  Other Topics Concern  . Not on file  Social History Narrative   Not exercising regularly    Family History  Problem Relation Age of Onset  . Diabetes Mother   . Leukemia Mother   . Alcohol abuse Father   . Lung cancer Father        smoker  . Diabetes Brother   . Heart attack Maternal Uncle        X3 ; MIs in 79s & 59s  . Stroke Maternal Uncle        in his 99s    Review of Systems  Constitutional: Negative for chills and fever.  HENT: Positive for rhinorrhea.   Respiratory: Positive for cough (minor residual from recent sinus infection). Negative for shortness of breath and wheezing.   Cardiovascular: Positive for palpitations (anxiety related). Negative for chest pain and leg swelling.  Neurological: Negative for light-headedness and headaches.  Psychiatric/Behavioral: Negative for dysphoric mood. The patient is nervous/anxious.        Objective:   Vitals:   02/06/17 0752  BP: 112/70  Pulse: 78  Resp: 16  Temp: 98.5 F (36.9 C)  SpO2: 98%   Wt Readings from Last 3 Encounters:  02/06/17 238 lb (108 kg)  01/25/17 240 lb (108.9 kg)  10/26/16 241 lb (109.3 kg)   Body mass index is 38.41 kg/m.   Physical Exam    Constitutional: Appears well-developed and well-nourished. No distress.  HENT:  Head: Normocephalic and atraumatic.  Neck: Neck supple. No tracheal deviation present. No thyromegaly present.  No cervical lymphadenopathy Cardiovascular: Normal rate, regular rhythm and normal heart sounds.   No murmur heard. No carotid bruit .  No edema Pulmonary/Chest: Effort normal and breath sounds normal. No respiratory distress. No has no wheezes. No rales.  Skin: Skin is warm and dry. Not diaphoretic.  Psychiatric: Normal mood and affect. Behavior is normal.      Assessment & Plan:    See Problem List for Assessment and Plan of  chronic medical problems.

## 2017-02-06 ENCOUNTER — Encounter: Payer: Self-pay | Admitting: Internal Medicine

## 2017-02-06 ENCOUNTER — Other Ambulatory Visit (INDEPENDENT_AMBULATORY_CARE_PROVIDER_SITE_OTHER): Payer: 59

## 2017-02-06 ENCOUNTER — Ambulatory Visit: Payer: 59 | Admitting: Internal Medicine

## 2017-02-06 VITALS — BP 112/70 | HR 78 | Temp 98.5°F | Resp 16 | Wt 238.0 lb

## 2017-02-06 DIAGNOSIS — E611 Iron deficiency: Secondary | ICD-10-CM | POA: Diagnosis not present

## 2017-02-06 DIAGNOSIS — E559 Vitamin D deficiency, unspecified: Secondary | ICD-10-CM

## 2017-02-06 DIAGNOSIS — E7849 Other hyperlipidemia: Secondary | ICD-10-CM | POA: Diagnosis not present

## 2017-02-06 DIAGNOSIS — R7303 Prediabetes: Secondary | ICD-10-CM

## 2017-02-06 DIAGNOSIS — I10 Essential (primary) hypertension: Secondary | ICD-10-CM

## 2017-02-06 DIAGNOSIS — E038 Other specified hypothyroidism: Secondary | ICD-10-CM

## 2017-02-06 DIAGNOSIS — F411 Generalized anxiety disorder: Secondary | ICD-10-CM

## 2017-02-06 HISTORY — DX: Vitamin D deficiency, unspecified: E55.9

## 2017-02-06 HISTORY — DX: Iron deficiency: E61.1

## 2017-02-06 LAB — LIPID PANEL
Cholesterol: 131 mg/dL (ref 0–200)
HDL: 37 mg/dL — ABNORMAL LOW (ref >39.00)
LDL CALC: 59 mg/dL (ref 0–99)
NonHDL: 94.24
TRIGLYCERIDES: 176 mg/dL — AB (ref 0.0–149.0)
Total CHOL/HDL Ratio: 4
VLDL: 35.2 mg/dL (ref 0.0–40.0)

## 2017-02-06 LAB — COMPREHENSIVE METABOLIC PANEL
ALBUMIN: 4.2 g/dL (ref 3.5–5.2)
ALT: 17 U/L (ref 0–35)
AST: 17 U/L (ref 0–37)
Alkaline Phosphatase: 52 U/L (ref 39–117)
BILIRUBIN TOTAL: 0.6 mg/dL (ref 0.2–1.2)
BUN: 13 mg/dL (ref 6–23)
CO2: 27 mEq/L (ref 19–32)
Calcium: 9 mg/dL (ref 8.4–10.5)
Chloride: 106 mEq/L (ref 96–112)
Creatinine, Ser: 0.96 mg/dL (ref 0.40–1.20)
GFR: 62.64 mL/min (ref >60.00)
GLUCOSE: 115 mg/dL — AB (ref 70–99)
POTASSIUM: 4.1 meq/L (ref 3.5–5.1)
Sodium: 139 mEq/L (ref 135–145)
Total Protein: 6.8 g/dL (ref 6.0–8.3)

## 2017-02-06 LAB — VITAMIN D 25 HYDROXY (VIT D DEFICIENCY, FRACTURES): VITD: 35.85 ng/mL (ref 30.00–100.00)

## 2017-02-06 LAB — HEMOGLOBIN A1C: HEMOGLOBIN A1C: 6.2 % (ref 4.6–6.5)

## 2017-02-06 LAB — IRON: Iron: 121 ug/dL (ref 42–145)

## 2017-02-06 LAB — FERRITIN: Ferritin: 257.7 ng/mL (ref 10.0–291.0)

## 2017-02-06 LAB — TSH: TSH: 2.33 u[IU]/mL (ref 0.35–4.50)

## 2017-02-06 MED ORDER — VITAMIN D-3 125 MCG (5000 UT) PO TABS: ORAL_TABLET | ORAL | Status: DC

## 2017-02-06 MED ORDER — FERROUS SULFATE 325 (65 FE) MG PO TABS: ORAL_TABLET | ORAL | 3 refills | Status: DC

## 2017-02-06 MED ORDER — B-12 1000 MCG PO TABS: 1000.0000 ug | ORAL_TABLET | Freq: Every day | ORAL | Status: DC

## 2017-02-06 MED ORDER — BIOTIN 10 MG PO CAPS: ORAL_CAPSULE | ORAL | Status: DC

## 2017-02-06 MED ORDER — SERTRALINE HCL 100 MG PO TABS: 100.0000 mg | ORAL_TABLET | Freq: Every day | ORAL | 3 refills | Status: DC

## 2017-02-06 NOTE — Assessment & Plan Note (Signed)
Not controlled Increase sertraline to 100 mg daily

## 2017-02-06 NOTE — Assessment & Plan Note (Signed)
Check vit d level Taking 5000 units daily

## 2017-02-06 NOTE — Assessment & Plan Note (Signed)
BP well controlled Current regimen effective and well tolerated Continue current medications at current doses cmp  

## 2017-02-06 NOTE — Assessment & Plan Note (Signed)
Check lipid panel  Continue daily statin Regular exercise and healthy diet encouraged  

## 2017-02-06 NOTE — Assessment & Plan Note (Signed)
Check a1c Low sugar / carb diet Stressed regular exercise, weight loss  

## 2017-02-06 NOTE — Assessment & Plan Note (Signed)
Check tsh  Titrate med dose if needed  

## 2017-02-06 NOTE — Assessment & Plan Note (Signed)
Taking 2 iron pills daily Will check iron levels

## 2017-03-05 DIAGNOSIS — L570 Actinic keratosis: Secondary | ICD-10-CM | POA: Diagnosis not present

## 2017-04-05 ENCOUNTER — Encounter: Payer: Self-pay | Admitting: Family Medicine

## 2017-04-05 ENCOUNTER — Ambulatory Visit: Payer: 59 | Admitting: Family Medicine

## 2017-04-05 DIAGNOSIS — J0100 Acute maxillary sinusitis, unspecified: Secondary | ICD-10-CM

## 2017-04-05 MED ORDER — AZITHROMYCIN 250 MG PO TABS: ORAL_TABLET | ORAL | 0 refills | Status: DC

## 2017-04-05 NOTE — Progress Notes (Deleted)
[name] [name] - 62 y.o. female MRN [id number]  Date of birth: 1955-10-06  SUBJECTIVE:  Including CC & ROS.  Chief Complaint  Patient presents with  . Sinus Problem    [name] [name] is a 62 y.o. female that is presenting with sinus pressure and cough. Ongoing for one week. She has been producing green mucous. Denies fevers or chills. She has been taking Sudafed and mucinex with no improvement.She has been around her son who has the flu. She does get flu shots.  Review of Systems  HISTORY: Past Medical, Surgical, Social, and Family History Reviewed & Updated per EMR.   Pertinent Historical Findings include:  Past Medical History:  Diagnosis Date  . Arthritis   . Cholelithiasis   . Hepatic steatosis   . Hyperlipemia   . Hypertension   . Hypothyroidism   . PONV (postoperative nausea and vomiting)     Past Surgical History:  Procedure Laterality Date  . ABDOMINAL HYSTERECTOMY     @ age 33, due to fibroids   . COLONOSCOPY W/ POLYPECTOMY  2010    Dr.Jacobs, due 2015   . DILATION AND CURETTAGE OF UTERUS    . MOHS SURGERY  2004   Basal Cell   . TONSILLECTOMY    . TOTAL KNEE ARTHROPLASTY  09/22/2011   Dr Nicholes Stairs, Left;    Allergies  Allergen Reactions  . Ampicillin Rash    rash  . Doxycycline Other (See Comments)    Joint symptoms w/o rash or fever  . Oxycodone Nausea Only  . Penicillins Rash    Family History  Problem Relation Age of Onset  . Diabetes Mother   . Leukemia Mother   . Alcohol abuse Father   . Lung cancer Father        smoker  . Diabetes Brother   . Heart attack Maternal Uncle        X3 ; MIs in 77s & 42s  . Stroke Maternal Uncle        in his 58s     Social History   Socioeconomic History  . Marital status: Married    Spouse name: Not on file  . Number of children: Not on file  . Years of education: Not on file  . Highest education level: Not on file  Social Needs  . Financial resource strain: Not on file  . Food insecurity -  worry: Not on file  . Food insecurity - inability: Not on file  . Transportation needs - medical: Not on file  . Transportation needs - non-medical: Not on file  Occupational History  . Not on file  Tobacco Use  . Smoking status: Never Smoker  . Smokeless tobacco: Never Used  Substance and Sexual Activity  . Alcohol use: Yes    Comment: rare  . Drug use: No  . Sexual activity: Not on file  Other Topics Concern  . Not on file  Social History Narrative   Not exercising regularly     PHYSICAL EXAM:  VS: BP 134/78 (BP Location: Left Arm, Patient Position: Sitting, Cuff Size: Large)   Pulse 82   Temp 98.6 F (37 C) (Oral)   Ht 5\' 6"  (1.676 m)   Wt 234 lb (106.1 kg)   SpO2 97%   BMI 37.77 kg/m  Physical Exam Gen: NAD, alert, cooperative with exam, well-appearing ENT: normal lips, normal nasal mucosa,  Eye: normal EOM, normal conjunctiva and lids CV:  no edema, +2 pedal pulses   Resp:  no accessory muscle use, non-labored,  GI: no masses or tenderness, no hernia  Skin: no rashes, no areas of induration  Neuro: normal tone, normal sensation to touch Psych:  normal insight, alert and oriented MSK:  ***      ASSESSMENT & PLAN:   No problem-specific Assessment & Plan notes found for this encounter.

## 2017-04-05 NOTE — Patient Instructions (Signed)
Please try things such as zyrtec-D or allegra-D which is an antihistamine and decongestant.   Please try afrin which will help with nasal congestion but use for only three days.   Please also try using a netti pot on a regular occasion.  Honey can help with a sore throat.   Vick's and Delsym can help with cough

## 2017-04-05 NOTE — Progress Notes (Signed)
[name] [name] - 62 y.o. female MRN [id number]  Date of birth: [birth year]  SUBJECTIVE:  Including CC & ROS.  Chief Complaint  Patient presents with  . Sinus Problem    [name] [name] is a 62 y.o. female that is presenting with sinus pressure and cough. Ongoing for one week. She has been producing green mucous. Denies fevers or chills. She has been taking Sudafed and mucinex with no improvement.She has been around her son who has the flu. She does get flu shots.    Review of Systems  Constitutional: Negative for fever.  HENT: Positive for congestion and sinus pressure.   Cardiovascular: Negative for chest pain.  Gastrointestinal: Positive for nausea.    HISTORY: Past Medical, Surgical, Social, and Family History Reviewed & Updated per EMR.   Pertinent Historical Findings include:  Past Medical History:  Diagnosis Date  . Arthritis   . Cholelithiasis   . Hepatic steatosis   . Hyperlipemia   . Hypertension   . Hypothyroidism   . PONV (postoperative nausea and vomiting)     Past Surgical History:  Procedure Laterality Date  . ABDOMINAL HYSTERECTOMY     @ age 63, due to fibroids   . COLONOSCOPY W/ POLYPECTOMY  2010    Dr.Jacobs, due 2015   . DILATION AND CURETTAGE OF UTERUS    . MOHS SURGERY  2004   Basal Cell   . TONSILLECTOMY    . TOTAL KNEE ARTHROPLASTY  09/22/2011   Dr Nicholes Stairs, Left;    Allergies  Allergen Reactions  . Ampicillin Rash    rash  . Doxycycline Other (See Comments)    Joint symptoms w/o rash or fever  . Oxycodone Nausea Only  . Penicillins Rash    Family History  Problem Relation Age of Onset  . Diabetes Mother   . Leukemia Mother   . Alcohol abuse Father   . Lung cancer Father        smoker  . Diabetes Brother   . Heart attack Maternal Uncle        X3 ; MIs in 22s & 63s  . Stroke Maternal Uncle        in his 71s     Social History   Socioeconomic History  . Marital status: Married    Spouse name: Not on file  . Number  of children: Not on file  . Years of education: Not on file  . Highest education level: Not on file  Social Needs  . Financial resource strain: Not on file  . Food insecurity - worry: Not on file  . Food insecurity - inability: Not on file  . Transportation needs - medical: Not on file  . Transportation needs - non-medical: Not on file  Occupational History  . Not on file  Tobacco Use  . Smoking status: Never Smoker  . Smokeless tobacco: Never Used  Substance and Sexual Activity  . Alcohol use: Yes    Comment: rare  . Drug use: No  . Sexual activity: Not on file  Other Topics Concern  . Not on file  Social History Narrative   Not exercising regularly     PHYSICAL EXAM:  VS: BP 134/78 (BP Location: Left Arm, Patient Position: Sitting, Cuff Size: Large)   Pulse 82   Temp 98.6 F (37 C) (Oral)   Ht 5\' 6"  (1.676 m)   Wt 234 lb (106.1 kg)   SpO2 97%   BMI 37.77 kg/m  Physical Exam Gen: NAD,  alert, cooperative with exam,  ENT: normal lips, normal nasal mucosa,  Eye: normal EOM, normal conjunctiva and lids CV:  no edema, +2 pedal pulses, regular rate and rhythm, S1-S2   Resp: no accessory muscle use, non-labored, clear to auscultation bilaterally, no crackles or wheezes Skin: no rashes, no areas of induration  Neuro: normal tone, normal sensation to touch Psych:  normal insight, alert and oriented MSK: Normal gait, normal strength       ASSESSMENT & PLAN:   Acute sinusitis Likely viral in nature. Progressing over one week  - printed azithromycin to have on hand  - counseled on supportive care  - given indications to follow up.

## 2017-04-05 NOTE — Assessment & Plan Note (Signed)
Likely viral in nature. Progressing over one week  - printed azithromycin to have on hand  - counseled on supportive care  - given indications to follow up.

## 2017-07-06 ENCOUNTER — Ambulatory Visit: Payer: Self-pay

## 2017-07-06 DIAGNOSIS — E038 Other specified hypothyroidism: Secondary | ICD-10-CM

## 2017-07-06 NOTE — Telephone Encounter (Signed)
Called pt. back to discuss concerns about hand tremors.  C/o gradual onset of hand tremors with worsening of them, over past 3 mos.  Reported she had changed from [facility] to [facility] at about the same time.  Noted the tremors started with new generic Rx from the mail order pharmacy.  (questioned if this is related to a different company producing the Levothyroxine, and possibly including different ingredients.)  Stated she stopped the Levothyroxine for approx. 1 week, and the hand tremors subsided.  She restarted taking the Levothyroxine, every other day, and stated the hand tremors have recurred, but are not as severe as they were, prior to stopping the medication.  Questions if it would be advised to switch to the brand name, "Synthroid."  Stated she doesn't want to have to continue to manage with the hand tremors.   Will make Dr. Quay Burow aware, and will contact pt. with further recommendations.  Encouraged to call office if has not rec'd call back by Tues., 07/10/17.  Encouraged to continue taking the qod dose, until Dr. Quay Burow gives further advice.  Verb. Understanding.  Agrees with plan.     Reason for Disposition . Caller has NON-URGENT medication question about med that PCP prescribed and triager unable to answer question  Answer Assessment - Initial Assessment Questions 1. SYMPTOMS: "Do you have any symptoms?"     Tremors of hands 2. SEVERITY: If symptoms are present, ask "Are they mild, moderate or severe?"     Onset of hand tremors that gradually progressed to a point that she stopped the medication (Levothyroxine) x 1 week; noticed an improvement in hand tremors, after being off the medication;  Then started every other day dosing about 1 week ago.  Reported the tremors are present again, but to the level they had been.  Questioned if changing pharmacy from [facility] to Mail order pharmacy would have impacted this by a change in the generic brand.  Stated she did not have  these tremors until about 3 mos. Ago.  Protocols used: MEDICATION QUESTION CALL-A-AH  Message from Conception Chancy, NT sent at 07/06/2017 12:41 PM EDT   Patient is calling and states she believes levothyroxine (SYNTHROID, LEVOTHROID) 100 MCG tablet is causing her to have trimmers in her fingers when she picks things up as she has recently stwitched to the generic version. She states she stopped the medicine for a week and they stopped. She has started the medicine back but is only taking it every other day. She states it is not all the time but it gets worse when she takes Levothyroxine that she would rather go by to Synthroid if this is what is causing it. She is wanting to speak to a nurse in regards to this. Please CB

## 2017-07-06 NOTE — Telephone Encounter (Signed)
Lets recheck thyroid function first.  Ordered. If this is overactive that may explain tremors.  If not can switch to other brand

## 2017-07-06 NOTE — Addendum Note (Signed)
Addended by: Binnie Rail on: 07/06/2017 03:17 PM   Modules accepted: Orders

## 2017-07-09 ENCOUNTER — Other Ambulatory Visit (INDEPENDENT_AMBULATORY_CARE_PROVIDER_SITE_OTHER): Payer: 59

## 2017-07-09 DIAGNOSIS — E038 Other specified hypothyroidism: Secondary | ICD-10-CM

## 2017-07-09 LAB — T3, FREE: T3 FREE: 3.1 pg/mL (ref 2.3–4.2)

## 2017-07-09 LAB — TSH: TSH: 4.97 u[IU]/mL — AB (ref 0.35–4.50)

## 2017-07-09 LAB — T4, FREE: FREE T4: 0.85 ng/dL (ref 0.60–1.60)

## 2017-07-13 ENCOUNTER — Other Ambulatory Visit: Payer: Self-pay | Admitting: Internal Medicine

## 2017-07-13 ENCOUNTER — Other Ambulatory Visit: Payer: Self-pay | Admitting: Emergency Medicine

## 2017-07-13 MED ORDER — LEVOTHYROXINE SODIUM 112 MCG PO TABS: 112.0000 ug | ORAL_TABLET | Freq: Every day | ORAL | 0 refills | Status: DC

## 2017-08-11 ENCOUNTER — Other Ambulatory Visit: Payer: Self-pay | Admitting: Internal Medicine

## 2017-09-08 ENCOUNTER — Other Ambulatory Visit: Payer: Self-pay | Admitting: Internal Medicine

## 2017-10-01 ENCOUNTER — Other Ambulatory Visit: Payer: Self-pay | Admitting: Internal Medicine

## 2017-10-15 NOTE — Patient Instructions (Addendum)

## 2017-10-15 NOTE — Progress Notes (Signed)
Subjective:    Patient ID: [name] [name], female    DOB: 03-22-1955, 61 y.o.   MRN: [id number]  HPI The patient is here for follow up.  Tremor in right hand: For a while she is experiencing a tremor in her right hand only.  Initially she thought it was the thyroid medication she was on and she called and requested a different one be sent to her pharmacy.  This did not improve the tremor.  She realized that most likely the tremor was coming from cervical radiculopathy.  She has a history of a herniated cervical disc and she has been moving and doing a lot of lifting.  She was experiencing some tingling in the arm, tremors and also having other symptoms that she had when the disc was herniated.  Her symptoms have improved since stopping lifting which she plans on just monitoring.  If it recurs or does not resolve completely she will see her surgeon who did her back surgery.    Hypertension: She is taking her medication daily. She is compliant with a low sodium diet.  She denies chest pain, palpitations, edema, shortness of breath and regular headaches. She is not exercising regularly.     Prediabetes:  She has not been compliant with a low sugar/carbohydrate diet.  She is not exercising regularly.  Hyperlipidemia: She is taking her medication daily. She is compliant with a low fat/cholesterol diet. She is not exercising regularly. She denies myalgias.   Hypothyroidism:  She is taking her medication daily.  She denies any recent changes in energy or weight that are unexplained.   Anxiety: She is taking her medication daily as prescribed. She denies any side effects from the medication. She feels her anxiety is well controlled and she is happy with her current dose of medication.   Iron def:  She is taking iron daily.  She did recently decrease the dose that she was taking because she was having side effects.  Medications and allergies reviewed with patient and updated if  appropriate.  Patient Active Problem List   Diagnosis Date Noted  . Vitamin D deficiency 02/06/2017  . Iron deficiency 02/06/2017  . Herniated cervical disc 06/17/2015  . Prediabetes 06/17/2015  . Palpitations 06/17/2015  . Anxiety state 03/28/2015  . Nonspecific abnormal electrocardiogram (ECG) (EKG) 03/20/2014  . OSA (obstructive sleep apnea) 09/06/2012  . Allergic rhinoconjunctivitis, seasonal and perennial 07/02/2012  . History of colonic polyps 02/09/2010  . METABOLIC SYNDROME X 25/05/3974  . GERD 09/11/2008  . CHOLELITHIASIS 09/11/2008  . FATTY LIVER DISEASE, HX OF 09/11/2008  . Hypothyroidism 09/11/2007  . Hyperlipidemia 09/11/2007  . Essential hypertension 09/11/2007  . Memory loss 09/11/2007  . CARCINOMA, BASAL CELL, HX OF 05/09/2006    Current Outpatient Medications on File Prior to Visit  Medication Sig Dispense Refill  . albuterol (PROVENTIL HFA;VENTOLIN HFA) 108 (90 Base) MCG/ACT inhaler Inhale 1-2 puffs into the lungs every 6 (six) hours as needed. 1 Inhaler 0  . Biotin 10 MG CAPS Taking daily    . Cholecalciferol (VITAMIN D-3) 5000 units TABS 5000 units daily 30 tablet   . Cyanocobalamin (B-12) 1000 MCG TABS Take 1,000 mcg by mouth daily. 30 tablet   . ferrous sulfate (FERROUSUL) 325 (65 FE) MG tablet Taking 2 tabs daily  3  . losartan (COZAAR) 100 MG tablet TAKE 1 TABLET BY MOUTH  EVERY DAY -- Office visit needed for further refills 90 tablet 0  . rosuvastatin (CRESTOR) 20 MG tablet  TAKE 1 TABLET BY MOUTH  EVERY DAY -- Office visit needed for further refills 90 tablet 0  . sertraline (ZOLOFT) 100 MG tablet Take 1 tablet (100 mg total) by mouth daily. 90 tablet 3  . spironolactone (ALDACTONE) 50 MG tablet Take 50 mg by mouth daily.    Marland Kitchen SYNTHROID 112 MCG tablet TAKE ONE (1) TABLET BY MOUTH EVERY DAY 30 tablet 0   No current facility-administered medications on file prior to visit.     Past Medical History:  Diagnosis Date  . Arthritis   . Cholelithiasis   .  Hepatic steatosis   . Hyperlipemia   . Hypertension   . Hypothyroidism   . PONV (postoperative nausea and vomiting)     Past Surgical History:  Procedure Laterality Date  . ABDOMINAL HYSTERECTOMY     @ age 42, due to fibroids   . COLONOSCOPY W/ POLYPECTOMY  2010    Dr.Jacobs, due 2015   . DILATION AND CURETTAGE OF UTERUS    . MOHS SURGERY  2004   Basal Cell   . TONSILLECTOMY    . TOTAL KNEE ARTHROPLASTY  09/22/2011   Dr Nicholes Stairs, Left;    Social History   Socioeconomic History  . Marital status: Married    Spouse name: Not on file  . Number of children: Not on file  . Years of education: Not on file  . Highest education level: Not on file  Occupational History  . Not on file  Social Needs  . Financial resource strain: Not on file  . Food insecurity:    Worry: Not on file    Inability: Not on file  . Transportation needs:    Medical: Not on file    Non-medical: Not on file  Tobacco Use  . Smoking status: Never Smoker  . Smokeless tobacco: Never Used  Substance and Sexual Activity  . Alcohol use: Yes    Comment: rare  . Drug use: No  . Sexual activity: Not on file  Lifestyle  . Physical activity:    Days per week: Not on file    Minutes per session: Not on file  . Stress: Not on file  Relationships  . Social connections:    Talks on phone: Not on file    Gets together: Not on file    Attends religious service: Not on file    Active member of club or organization: Not on file    Attends meetings of clubs or organizations: Not on file    Relationship status: Not on file  Other Topics Concern  . Not on file  Social History Narrative   Not exercising regularly    Family History  Problem Relation Age of Onset  . Diabetes Mother   . Leukemia Mother   . Alcohol abuse Father   . Lung cancer Father        smoker  . Diabetes Brother   . Heart attack Maternal Uncle        X3 ; MIs in 83s & 34s  . Stroke Maternal Uncle        in his 41s    Review of  Systems  Constitutional: Negative for chills and fever.  Respiratory: Negative for cough, shortness of breath and wheezing.   Cardiovascular: Negative for chest pain, palpitations and leg swelling.  Neurological: Negative for light-headedness and headaches.       Objective:   Vitals:   10/16/17 1108  BP: 140/84  Pulse: 91  Resp: 16  Temp: 98.4 F (36.9 C)  SpO2: 97%   BP Readings from Last 3 Encounters:  10/16/17 140/84  04/05/17 134/78  02/06/17 112/70   Wt Readings from Last 3 Encounters:  10/16/17 232 lb (105.2 kg)  04/05/17 234 lb (106.1 kg)  02/06/17 238 lb (108 kg)   Body mass index is 37.45 kg/m.   Physical Exam    Constitutional: Appears well-developed and well-nourished. No distress.  HENT:  Head: Normocephalic and atraumatic.  Neck: Neck supple. No tracheal deviation present. No thyromegaly present.  No cervical lymphadenopathy Cardiovascular: Normal rate, regular rhythm and normal heart sounds.   No murmur heard. No carotid bruit .  No edema Pulmonary/Chest: Effort normal and breath sounds normal. No respiratory distress. No has no wheezes. No rales.  Skin: Skin is warm and dry. Not diaphoretic.  Psychiatric: Normal mood and affect. Behavior is normal.      Assessment & Plan:    See Problem List for Assessment and Plan of chronic medical problems.

## 2017-10-16 ENCOUNTER — Encounter: Payer: Self-pay | Admitting: Internal Medicine

## 2017-10-16 ENCOUNTER — Other Ambulatory Visit (INDEPENDENT_AMBULATORY_CARE_PROVIDER_SITE_OTHER): Payer: 59

## 2017-10-16 ENCOUNTER — Ambulatory Visit: Payer: 59 | Admitting: Internal Medicine

## 2017-10-16 VITALS — BP 140/84 | HR 91 | Temp 98.4°F | Resp 16 | Ht 66.0 in | Wt 232.0 lb

## 2017-10-16 DIAGNOSIS — I10 Essential (primary) hypertension: Secondary | ICD-10-CM

## 2017-10-16 DIAGNOSIS — E611 Iron deficiency: Secondary | ICD-10-CM | POA: Diagnosis not present

## 2017-10-16 DIAGNOSIS — Z23 Encounter for immunization: Secondary | ICD-10-CM

## 2017-10-16 DIAGNOSIS — R7303 Prediabetes: Secondary | ICD-10-CM

## 2017-10-16 DIAGNOSIS — F411 Generalized anxiety disorder: Secondary | ICD-10-CM

## 2017-10-16 DIAGNOSIS — E038 Other specified hypothyroidism: Secondary | ICD-10-CM

## 2017-10-16 DIAGNOSIS — E7849 Other hyperlipidemia: Secondary | ICD-10-CM

## 2017-10-16 LAB — COMPREHENSIVE METABOLIC PANEL
ALBUMIN: 4.4 g/dL (ref 3.5–5.2)
ALT: 16 U/L (ref 0–35)
AST: 18 U/L (ref 0–37)
Alkaline Phosphatase: 53 U/L (ref 39–117)
BUN: 15 mg/dL (ref 6–23)
CHLORIDE: 105 meq/L (ref 96–112)
CO2: 29 meq/L (ref 19–32)
Calcium: 9.3 mg/dL (ref 8.4–10.5)
Creatinine, Ser: 0.93 mg/dL (ref 0.40–1.20)
GFR: 64.83 mL/min (ref >60.00)
GLUCOSE: 63 mg/dL — AB (ref 70–99)
POTASSIUM: 4.1 meq/L (ref 3.5–5.1)
SODIUM: 141 meq/L (ref 135–145)
Total Bilirubin: 0.5 mg/dL (ref 0.2–1.2)
Total Protein: 7 g/dL (ref 6.0–8.3)

## 2017-10-16 LAB — HEMOGLOBIN A1C: HEMOGLOBIN A1C: 6.1 % (ref 4.6–6.5)

## 2017-10-16 LAB — TSH: TSH: 2.85 u[IU]/mL (ref 0.35–4.50)

## 2017-10-16 LAB — LIPID PANEL
Cholesterol: 132 mg/dL (ref 0–200)
HDL: 33.5 mg/dL — ABNORMAL LOW (ref >39.00)
NONHDL: 98.87
Total CHOL/HDL Ratio: 4
Triglycerides: 248 mg/dL — ABNORMAL HIGH (ref 0.0–149.0)
VLDL: 49.6 mg/dL — ABNORMAL HIGH (ref 0.0–40.0)

## 2017-10-16 LAB — T4, FREE: FREE T4: 0.77 ng/dL (ref 0.60–1.60)

## 2017-10-16 LAB — LDL CHOLESTEROL, DIRECT: Direct LDL: 77 mg/dL

## 2017-10-16 LAB — FERRITIN: Ferritin: 224 ng/mL (ref 10.0–291.0)

## 2017-10-16 LAB — IRON: IRON: 79 ug/dL (ref 42–145)

## 2017-10-16 MED ORDER — LOSARTAN POTASSIUM 100 MG PO TABS: ORAL_TABLET | ORAL | 1 refills | Status: DC

## 2017-10-16 MED ORDER — SYNTHROID 112 MCG PO TABS: ORAL_TABLET | ORAL | 1 refills | Status: DC

## 2017-10-16 NOTE — Assessment & Plan Note (Signed)
Taking iron daily-recently decreased the dose she is taking the side effects Check iron, ferritin

## 2017-10-16 NOTE — Addendum Note (Signed)
Addended by: Delice Bison E on: 10/16/2017 12:29 PM   Modules accepted: Orders

## 2017-10-16 NOTE — Assessment & Plan Note (Signed)
Check a1c Low sugar / carb diet Stressed regular exercise, weight loss  

## 2017-10-16 NOTE — Assessment & Plan Note (Signed)
BP overall controlled, but slightly elevated here today Current regimen effective and well tolerated Continue current medications at current doses cmp

## 2017-10-16 NOTE — Assessment & Plan Note (Signed)
Check lipid panel  Continue daily statin Regular exercise and healthy diet encouraged  

## 2017-10-16 NOTE — Assessment & Plan Note (Signed)
Clinically euthyroid Check tsh  Titrate med dose if needed  

## 2017-10-16 NOTE — Assessment & Plan Note (Addendum)
Controlled, stable There has been increased anxiety just with the move, but she is feels she is doing good Continue current dose of medication

## 2017-10-23 DIAGNOSIS — M5022 Other cervical disc displacement, mid-cervical region, unspecified level: Secondary | ICD-10-CM | POA: Diagnosis not present

## 2017-10-23 DIAGNOSIS — M5412 Radiculopathy, cervical region: Secondary | ICD-10-CM | POA: Diagnosis not present

## 2017-10-27 DIAGNOSIS — M542 Cervicalgia: Secondary | ICD-10-CM | POA: Diagnosis not present

## 2017-11-06 DIAGNOSIS — M5022 Other cervical disc displacement, mid-cervical region, unspecified level: Secondary | ICD-10-CM | POA: Diagnosis not present

## 2017-11-06 DIAGNOSIS — M542 Cervicalgia: Secondary | ICD-10-CM | POA: Diagnosis not present

## 2017-11-26 ENCOUNTER — Other Ambulatory Visit: Payer: Self-pay | Admitting: Internal Medicine

## 2017-12-18 DIAGNOSIS — M542 Cervicalgia: Secondary | ICD-10-CM | POA: Diagnosis not present

## 2017-12-18 DIAGNOSIS — M5412 Radiculopathy, cervical region: Secondary | ICD-10-CM | POA: Diagnosis not present

## 2017-12-18 DIAGNOSIS — M5022 Other cervical disc displacement, mid-cervical region, unspecified level: Secondary | ICD-10-CM | POA: Diagnosis not present

## 2017-12-19 DIAGNOSIS — D485 Neoplasm of uncertain behavior of skin: Secondary | ICD-10-CM | POA: Diagnosis not present

## 2017-12-19 DIAGNOSIS — Z08 Encounter for follow-up examination after completed treatment for malignant neoplasm: Secondary | ICD-10-CM | POA: Diagnosis not present

## 2017-12-19 DIAGNOSIS — L570 Actinic keratosis: Secondary | ICD-10-CM | POA: Diagnosis not present

## 2017-12-19 DIAGNOSIS — Z85828 Personal history of other malignant neoplasm of skin: Secondary | ICD-10-CM | POA: Diagnosis not present

## 2017-12-19 DIAGNOSIS — L648 Other androgenic alopecia: Secondary | ICD-10-CM | POA: Diagnosis not present

## 2017-12-19 DIAGNOSIS — C44319 Basal cell carcinoma of skin of other parts of face: Secondary | ICD-10-CM | POA: Diagnosis not present

## 2018-01-03 ENCOUNTER — Other Ambulatory Visit: Payer: Self-pay | Admitting: Internal Medicine

## 2018-01-17 ENCOUNTER — Ambulatory Visit: Payer: 59 | Admitting: Family Medicine

## 2018-01-17 ENCOUNTER — Encounter: Payer: Self-pay | Admitting: Family Medicine

## 2018-01-17 VITALS — BP 148/68 | HR 111 | Temp 99.3°F | Ht 66.0 in | Wt 235.0 lb

## 2018-01-17 DIAGNOSIS — J209 Acute bronchitis, unspecified: Secondary | ICD-10-CM

## 2018-01-17 DIAGNOSIS — J029 Acute pharyngitis, unspecified: Secondary | ICD-10-CM | POA: Diagnosis not present

## 2018-01-17 LAB — POCT RAPID STREP A (OFFICE): RAPID STREP A SCREEN: NEGATIVE

## 2018-01-17 MED ORDER — AZITHROMYCIN 250 MG PO TABS: ORAL_TABLET | ORAL | 0 refills | Status: DC

## 2018-01-17 MED ORDER — ALBUTEROL SULFATE HFA 108 (90 BASE) MCG/ACT IN AERS: 1.0000 | INHALATION_SPRAY | Freq: Four times a day (QID) | RESPIRATORY_TRACT | 0 refills | Status: DC | PRN

## 2018-01-17 NOTE — Patient Instructions (Signed)
It was great seeing you today.  I have refilled your inhaler to use if needed.  Mucinex DM or Delsym can be used for cough. Please avoid combination medications that can raise your blood pressure.  Use the cough syrup at night.   If no improvement, follow up with your provider for further evaluation. Take care   Acute Bronchitis Bronchitis is inflammation of the airways that extend from the windpipe into the lungs (bronchi). The inflammation often causes mucus to develop. This leads to a cough, which is the most common symptom of bronchitis.  In acute bronchitis, the condition usually develops suddenly and goes away over time, usually in a couple weeks. Smoking, allergies, and asthma can make bronchitis worse. Repeated episodes of bronchitis may cause further lung problems.  CAUSES Acute bronchitis is most often caused by the same virus that causes a cold. The virus can spread from person to person (contagious) through coughing, sneezing, and touching contaminated objects. SIGNS AND SYMPTOMS   Cough.   Fever.   Coughing up mucus.   Body aches.   Chest congestion.   Chills.   Shortness of breath.   Sore throat.  DIAGNOSIS  Acute bronchitis is usually diagnosed through a physical exam. Your health care provider will also ask you questions about your medical history. Tests, such as chest X-rays, are sometimes done to rule out other conditions.  TREATMENT  Acute bronchitis usually goes away in a couple weeks. Oftentimes, no medical treatment is necessary. Medicines are sometimes given for relief of fever or cough. Antibiotic medicines are usually not needed but may be prescribed in certain situations. In some cases, an inhaler may be recommended to help reduce shortness of breath and control the cough. A cool mist vaporizer may also be used to help thin bronchial secretions and make it easier to clear the chest.  HOME CARE INSTRUCTIONS  Get plenty of rest.   Drink  enough fluids to keep your urine clear or pale yellow (unless you have a medical condition that requires fluid restriction). Increasing fluids may help thin your respiratory secretions (sputum) and reduce chest congestion, and it will prevent dehydration.   Take medicines only as directed by your health care provider.  If you were prescribed an antibiotic medicine, finish it all even if you start to feel better.  Avoid smoking and secondhand smoke. Exposure to cigarette smoke or irritating chemicals will make bronchitis worse. If you are a smoker, consider using nicotine gum or skin patches to help control withdrawal symptoms. Quitting smoking will help your lungs heal faster.   Reduce the chances of another bout of acute bronchitis by washing your hands frequently, avoiding people with cold symptoms, and trying not to touch your hands to your mouth, nose, or eyes.   Keep all follow-up visits as directed by your health care provider.  SEEK MEDICAL CARE IF: Your symptoms do not improve after 1 week of treatment.  SEEK IMMEDIATE MEDICAL CARE IF:  You develop an increased fever or chills.   You have chest pain.   You have severe shortness of breath.  You have bloody sputum.   You develop dehydration.  You faint or repeatedly feel like you are going to pass out.  You develop repeated vomiting.  You develop a severe headache. MAKE SURE YOU:   Understand these instructions.  Will watch your condition.  Will get help right away if you are not doing well or get worse.   This information is not intended to  replace advice given to you by your health care provider. Make sure you discuss any questions you have with your health care provider.   Document Released: 02/17/2004 Document Revised: 01/30/2014 Document Reviewed: 07/02/2012 Elsevier Interactive Patient Education [employer].

## 2018-01-17 NOTE — Progress Notes (Signed)
Patient ID: [name] [name], female   DOB: 1955/10/02, 62 y.o.   MRN: [id number]  PCP: Binnie Rail, MD  Subjective:  [name] [name] is a 62 y.o. year old very pleasant female patient who presents with symptoms including sore throat, cough that is nonproductive, post nasal drip, rhinitis, body aches, mild SOB  -started: two weeks ago symptoms that improved but did not resolve and have now worsened -previous treatments: Mucinex cold and flu that has provided limited benefit -sick contacts/travel/risks: denies flu exposure. No recent sick contact exposure. -Hx of: allergies She is not a smoker   ROS-denies fever, significant SOB, NVD, tooth pain  Pertinent Past Medical History- HTN  Medications- reviewed  Current Outpatient Medications  Medication Sig Dispense Refill  . albuterol (PROVENTIL HFA;VENTOLIN HFA) 108 (90 Base) MCG/ACT inhaler Inhale 1-2 puffs into the lungs every 6 (six) hours as needed. 1 Inhaler 0  . Biotin 10 MG CAPS Taking daily    . Cholecalciferol (VITAMIN D-3) 5000 units TABS 5000 units daily 30 tablet   . Cyanocobalamin (B-12) 1000 MCG TABS Take 1,000 mcg by mouth daily. 30 tablet   . diclofenac (VOLTAREN) 75 MG EC tablet Take 1 tablet by mouth 2 (two) times daily.    . ferrous sulfate (FERROUSUL) 325 (65 FE) MG tablet Taking 2 tabs daily  3  . losartan (COZAAR) 100 MG tablet TAKE 1 TABLET BY MOUTH  EVERY DAY -- Office visit needed for further refills 90 tablet 1  . rosuvastatin (CRESTOR) 20 MG tablet TAKE 1 TABLET BY MOUTH  EVERY DAY -- OFFICE VISIT  NEEDED FOR FURTHER REFILLS 90 tablet 0  . sertraline (ZOLOFT) 100 MG tablet Take 1 tablet (100 mg total) by mouth daily. -- Office visit needed for further refills 90 tablet 0  . spironolactone (ALDACTONE) 50 MG tablet Take 50 mg by mouth daily.    Marland Kitchen SYNTHROID 112 MCG tablet TAKE ONE (1) TABLET BY MOUTH EVERY DAY 90 tablet 1   No current facility-administered medications for this visit.     Objective: BP  (!) 148/68 (BP Location: Left Arm, Patient Position: Sitting, Cuff Size: Large)   Pulse (!) 111   Temp 99.3 F (37.4 C) (Oral)   Ht 5\' 6"  (1.676 m)   Wt 235 lb (106.6 kg)   SpO2 96%   BMI 37.93 kg/m  Retake of HR: 100 Gen: NAD, resting comfortably HEENT: Turbinates erythematous, TMs normal bilaterally, pharynx erythematous with no exudate or edema, mild frontal sinus tenderness CV: RRR no murmurs rubs or gallops Lungs: CTAB no crackles, wheeze, rhonchi Abdomen: soft/nontender/nondistended/normal bowel sounds. No rebound or guarding.  Ext: no edema Skin: warm, dry, no rash Neuro: grossly normal, moves all extremities  Assessment/Plan: 1. Bronchitis Patient's symptoms most consistent with bronchitis. Discussed likely viral nature and less likely bacterial cause. History is consistent with double sickening presentation with symptoms that started over 2 weeks ago and improved but did not resolve and have returned. Benign lung exam and stable vital signs make pneumonia unlikely today. Discussed possible treatment options and I will refill albuterol inhaler. She will use Mucinex DM or Delsym for cough. Also provided with a prescription for azithromycin to take with double sickening presentation. Reviewed OTC medications that can increase BP as she was taking an OTC cold medication with a decongestant. She will avoid decongestants and use Mucinex DM or Delsym.  Advised that if her symptoms were to worsen she should let us know. Return precautions provided.  2. Sore throat  Rapid strep negative. Advised patient on supportive measures:  Get rest, drink plenty of fluids, warm salt water gargles, and use tylenol as needed for pain. Follow up if fever >101, if symptoms worsen or if symptoms are not improved in 3 to 4  days. Patient verbalizes understanding.   Finally, we reviewed reasons to return to care including if symptoms worsen or persist or new concerns arise- once again particularly shortness of  breath or fever.    [name] Quint, FNP

## 2018-03-06 ENCOUNTER — Other Ambulatory Visit: Payer: Self-pay | Admitting: Internal Medicine

## 2018-03-06 DIAGNOSIS — C44319 Basal cell carcinoma of skin of other parts of face: Secondary | ICD-10-CM | POA: Diagnosis not present

## 2018-03-11 ENCOUNTER — Other Ambulatory Visit: Payer: Self-pay | Admitting: Internal Medicine

## 2018-03-12 ENCOUNTER — Other Ambulatory Visit: Payer: Self-pay | Admitting: Internal Medicine

## 2018-03-23 ENCOUNTER — Emergency Department
Admission: EM | Admit: 2018-03-23 | Discharge: 2018-03-23 | Disposition: A | Discharge status: Home / Self Care | Payer: 59 | Source: Home / Self Care

## 2018-03-23 ENCOUNTER — Other Ambulatory Visit: Payer: Self-pay

## 2018-03-23 DIAGNOSIS — J0101 Acute recurrent maxillary sinusitis: Secondary | ICD-10-CM

## 2018-03-23 MED ORDER — FLUTICASONE PROPIONATE 50 MCG/ACT NA SUSP: NASAL | 1 refills | Status: DC

## 2018-03-23 MED ORDER — AZITHROMYCIN 250 MG PO TABS: ORAL_TABLET | ORAL | 0 refills | Status: DC

## 2018-03-23 NOTE — Discharge Instructions (Signed)
Drink plenty of fluids to stay well-hydrated  Take the azithromycin 2 pills initially, then 1 daily for 4 days.    Use fluticasone nose spray 2 sprays each nostril twice daily for 3 days, then once daily  Take OTC Tylenol or ibuprofen if needed for pain or fever, and you can take a over-the-counter decongestant if needed.  Return or get rechecked if worse.

## 2018-03-23 NOTE — ED Provider Notes (Signed)
[facility]    CSN: [id number] Arrival date & time: 03/23/18  1441     History   Chief Complaint Chief Complaint  Patient presents with  . Facial Pain  . Nasal Congestion  . Cough    HPI [name] [name] is a 63 y.o. female.   HPI Patient has had a history over the past week or more of a respiratory tract infection.  She is continued to get more pressure and pain in her facial area.  She is blowing out some purulent and bloody mucus.  She does not smoke.  She has history of sinus infections in the past.  She was last treated late last year with antibiotics.  She is allergic to several things so often ends up being on azithromycin.  She is not diabetic. Past Medical History:  Diagnosis Date  . Arthritis   . Cholelithiasis   . Hepatic steatosis   . Hyperlipemia   . Hypertension   . Hypothyroidism   . PONV (postoperative nausea and vomiting)     Patient Active Problem List   Diagnosis Date Noted  . Vitamin D deficiency 02/06/2017  . Iron deficiency 02/06/2017  . Herniated cervical disc 06/17/2015  . Prediabetes 06/17/2015  . Palpitations 06/17/2015  . Anxiety state 03/28/2015  . Nonspecific abnormal electrocardiogram (ECG) (EKG) 03/20/2014  . OSA (obstructive sleep apnea) 09/06/2012  . Allergic rhinoconjunctivitis, seasonal and perennial 07/02/2012  . History of colonic polyps 02/09/2010  . METABOLIC SYNDROME X 93/79/0240  . GERD 09/11/2008  . CHOLELITHIASIS 09/11/2008  . FATTY LIVER DISEASE, HX OF 09/11/2008  . Hypothyroidism 09/11/2007  . Hyperlipidemia 09/11/2007  . Essential hypertension 09/11/2007  . Memory loss 09/11/2007  . CARCINOMA, BASAL CELL, HX OF 05/09/2006    Past Surgical History:  Procedure Laterality Date  . ABDOMINAL HYSTERECTOMY     @ age 28, due to fibroids   . COLONOSCOPY W/ POLYPECTOMY  2010    Dr.Jacobs, due 2015   . DILATION AND CURETTAGE OF UTERUS    . MOHS SURGERY  2004   Basal Cell   . TONSILLECTOMY    . TOTAL  KNEE ARTHROPLASTY  09/22/2011   Dr Nicholes Stairs, Left;    OB History   No obstetric history on file.      Home Medications    Prior to Admission medications   Medication Sig Start Date End Date Taking? Authorizing Provider  albuterol (PROVENTIL HFA;VENTOLIN HFA) 108 (90 Base) MCG/ACT inhaler Inhale 1-2 puffs into the lungs every 6 (six) hours as needed. 01/17/18   Delano Metz, FNP  azithromycin (ZITHROMAX) 250 MG tablet Take 2 tablets by mouth at once today, then one tablet daily x 4 days. 01/17/18   Delano Metz, FNP  Biotin 10 MG CAPS Taking daily 02/06/17   Binnie Rail, MD  Cholecalciferol (VITAMIN D-3) 5000 units TABS 5000 units daily 02/06/17   Binnie Rail, MD  Cyanocobalamin (B-12) 1000 MCG TABS Take 1,000 mcg by mouth daily. 02/06/17   Binnie Rail, MD  diclofenac (VOLTAREN) 75 MG EC tablet Take 1 tablet by mouth 2 (two) times daily.    [provider]  ferrous sulfate (FERROUSUL) 325 (65 FE) MG tablet Taking 2 tabs daily 02/06/17   Binnie Rail, MD  losartan (COZAAR) 100 MG tablet TAKE 1 TABLET BY MOUTH  DAILY 03/07/18   Binnie Rail, MD  rosuvastatin (CRESTOR) 20 MG tablet TAKE 1 TABLET BY MOUTH  EVERY DAY 03/12/18   Burns, Claudina Lick,  MD  sertraline (ZOLOFT) 100 MG tablet Take 1 tablet (100 mg total) by mouth daily. -- Office visit needed for further refills 01/04/18   Binnie Rail, MD  spironolactone (ALDACTONE) 50 MG tablet Take 50 mg by mouth daily.    [provider]  SYNTHROID 112 MCG tablet TAKE 1 TABLET BY MOUTH  EVERY DAY 03/11/18   Binnie Rail, MD    Family History Family History  Problem Relation Age of Onset  . Diabetes Mother   . Leukemia Mother   . Alcohol abuse Father   . Lung cancer Father        smoker  . Diabetes Brother   . Heart attack Maternal Uncle        X3 ; MIs in 28s & 54s  . Stroke Maternal Uncle        in his 22s    Social History Social History   Tobacco Use  . Smoking status: Never Smoker  . Smokeless  tobacco: Never Used  Substance Use Topics  . Alcohol use: Yes    Comment: rare  . Drug use: No     Allergies   Ampicillin; Doxycycline; Oxycodone; and Penicillins   Review of Systems Review of Systems Constitutional: Unremarkable HEENT: As above.  Does hurt in her throat.  Ears are fine. Lymphatic: Unremarkable Cardiovascular: Unremarkable Respiratory: A little nonproductive cough Gastrointestinal: Unremarkable   Physical Exam Triage Vital Signs ED Triage Vitals [03/23/18 1513]  Enc Vitals Group     BP 124/79     Pulse Rate 96     Resp 18     Temp 98.3 F (36.8 C)     Temp Source Oral     SpO2 96 %     Weight 231 lb (104.8 kg)     Height 5\' 6"  (1.676 m)     Head Circumference      Peak Flow      Pain Score 0     Pain Loc      Pain Edu?      Excl. in [place]?    No data found.  Updated Vital Signs BP 124/79 (BP Location: Right Arm)   Pulse 96   Temp 98.3 F (36.8 C) (Oral)   Resp 18   Ht 5\' 6"  (1.676 m)   Wt 104.8 kg   SpO2 96%   BMI 37.28 kg/m   Visual Acuity Right Eye Distance:   Left Eye Distance:   Bilateral Distance:    Right Eye Near:   Left Eye Near:    Bilateral Near:     Physical Exam Looks like she does not feel real well.  Her TMs are normal.  Sinuses not particular tender in the frontal area but a little tender in the maxillary.  Nose congested.  Throat clear.  Neck supple without significant nodes.  Chest is clear to auscultation.  Heart regular without murmurs.  UC Treatments / Results  Labs (all labs ordered are listed, but only abnormal results are displayed) Labs Reviewed - No data to display  EKG None  Radiology No results found.  Procedures Procedures (including critical care time)  Medications Ordered in UC Medications - No data to display  Initial Impression / Assessment and Plan / UC Course  I have reviewed the triage vital signs and the nursing notes.  Pertinent labs & imaging results that were available during  my care of the patient were reviewed by me and considered in my medical decision making (  see chart for details).     Clinically consistent with a sinusitis which she has recurrently.  Will treat with antibiotics since it is getting worse rather than better. Final Clinical Impressions(s) / UC Diagnoses   Final diagnoses:  None   Discharge Instructions   None    ED Prescriptions    None     Controlled Substance Prescriptions [place] Controlled Substance Registry consulted? No   Posey Boyer, MD 03/23/18 [phone]

## 2018-03-23 NOTE — ED Triage Notes (Signed)
Pt c/o facial pain and sinus congestion x 1.5 weeks. Non productive cough, yellow/brown mucous and difficulty breathing at night. Taking mucinex prn.

## 2018-06-14 ENCOUNTER — Other Ambulatory Visit: Payer: Self-pay | Admitting: Internal Medicine

## 2018-06-25 NOTE — Progress Notes (Signed)
Subjective:    Patient ID: [name] [name], female    DOB: 1955/04/30, 63 y.o.   MRN: [id number]  HPI The patient is here for follow up.  She is exercising regularly-walks and has started doing some floor exercises for her hips and but.     Both of her gluteus muscle hurt - it feels like a spasm.  She started doing more floor exercises to see if this would help, but it has not.  She does walk regularly.  She feels the spasms with walking. She only feels it at home.  She has not noticed this when she is at work.  It started when she moved into a new house, which is on a slab.  She has been trying to wear shoes when she walks in the house.  Hypertension: She is taking her medication daily. She is compliant with a low sodium diet.  She denies chest pain, palpitations, edema, shortness of breath and regular headaches.    Prediabetes:  She is compliant with a low sugar/carbohydrate diet.  She is exercising regularly.  Hyperlipidemia: She is taking her medication daily. She is compliant with a low fat/cholesterol diet. She denies myalgias.   Hypothyroidism:  She is taking her medication daily.  She denies any recent changes in energy or weight that are unexplained.   Anxiety: She is taking her medication daily as prescribed. She denies any side effects from the medication.  She notes her stress level is high, which is likely from work.  She denies any depression.  She is unsure of her anxieties ideally controlled.  She has felt anxiety intermittently.  Iron def:  She is not taking iron daily.  She stopped all of her vitamins about a month ago to see if that was affecting her tremor.  She thinks the tremor may have gotten slightly better since stopping everything.  Profuse sweating, anxiety: She is having a lot of sweating and anxiety like feelings.  She will profusely start sweating after light housework.  This is unusual.  This started a few months ago.  She stopped all her vitamins.  She  is still sweating and does not think stopping vitamins has helped, but is not sure.  She denies any medication changes that could account for the sweating.  Memory concerns: Both her and her husband are concerned about her memory issues.  She became more concerned when her husband said he was concerned.  She will be doing something and forget her husbands name.  She will be talking to someone and forget what she is talking about.  Certain things she forgets will not come to her - she has to look them up.  She notes her stress level is high.  She does have some increased anxiety.  She denies any depression.  Tremor:  Maybe better since stopping the vitamins.   It is both hands.   She denies a family history of tremors.    Medications and allergies reviewed with patient and updated if appropriate.  Patient Active Problem List   Diagnosis Date Noted  . Vitamin D deficiency 02/06/2017  . Iron deficiency 02/06/2017  . Herniated cervical disc 06/17/2015  . Prediabetes 06/17/2015  . Palpitations 06/17/2015  . Anxiety state 03/28/2015  . Nonspecific abnormal electrocardiogram (ECG) (EKG) 03/20/2014  . OSA (obstructive sleep apnea) 09/06/2012  . Allergic rhinoconjunctivitis, seasonal and perennial 07/02/2012  . History of colonic polyps 02/09/2010  . METABOLIC SYNDROME X 73/22/0254  . GERD 09/11/2008  .  CHOLELITHIASIS 09/11/2008  . FATTY LIVER DISEASE, HX OF 09/11/2008  . Hypothyroidism 09/11/2007  . Hyperlipidemia 09/11/2007  . Essential hypertension 09/11/2007  . Memory loss 09/11/2007  . CARCINOMA, BASAL CELL, HX OF 05/09/2006    Current Outpatient Medications on File Prior to Visit  Medication Sig Dispense Refill  . albuterol (PROVENTIL HFA;VENTOLIN HFA) 108 (90 Base) MCG/ACT inhaler Inhale 1-2 puffs into the lungs every 6 (six) hours as needed. 1 Inhaler 0  . diclofenac (VOLTAREN) 75 MG EC tablet Take 1 tablet by mouth 2 (two) times daily.    . fluticasone (FLONASE) 50 MCG/ACT nasal  spray Use 2 sprays each nostril twice daily for 3 days, then once daily. 16 g 1  . losartan (COZAAR) 100 MG tablet TAKE 1 TABLET BY MOUTH  DAILY 90 tablet 1  . rosuvastatin (CRESTOR) 20 MG tablet TAKE 1 TABLET BY MOUTH  EVERY DAY 90 tablet 1  . sertraline (ZOLOFT) 100 MG tablet Take 1 tablet by mouth daily. 90 tablet 0  . spironolactone (ALDACTONE) 50 MG tablet Take 50 mg by mouth daily.    Marland Kitchen SYNTHROID 112 MCG tablet TAKE 1 TABLET BY MOUTH  EVERY DAY 90 tablet 0  . Biotin 10 MG CAPS Taking daily (Patient not taking: Reported on 06/26/2018)    . Cholecalciferol (VITAMIN D-3) 5000 units TABS 5000 units daily (Patient not taking: Reported on 06/26/2018) 30 tablet   . Cyanocobalamin (B-12) 1000 MCG TABS Take 1,000 mcg by mouth daily. (Patient not taking: Reported on 06/26/2018) 30 tablet   . ferrous sulfate (FERROUSUL) 325 (65 FE) MG tablet Taking 2 tabs daily (Patient not taking: Reported on 06/26/2018)  3   No current facility-administered medications on file prior to visit.     Past Medical History:  Diagnosis Date  . Arthritis   . Cholelithiasis   . Hepatic steatosis   . Hyperlipemia   . Hypertension   . Hypothyroidism   . PONV (postoperative nausea and vomiting)     Past Surgical History:  Procedure Laterality Date  . ABDOMINAL HYSTERECTOMY     @ age 89, due to fibroids   . COLONOSCOPY W/ POLYPECTOMY  2010    Dr.Jacobs, due 2015   . DILATION AND CURETTAGE OF UTERUS    . MOHS SURGERY  2004   Basal Cell   . TONSILLECTOMY    . TOTAL KNEE ARTHROPLASTY  09/22/2011   Dr Nicholes Stairs, Left;    Social History   Socioeconomic History  . Marital status: Married    Spouse name: Not on file  . Number of children: Not on file  . Years of education: Not on file  . Highest education level: Not on file  Occupational History  . Not on file  Social Needs  . Financial resource strain: Not on file  . Food insecurity:    Worry: Not on file    Inability: Not on file  . Transportation needs:     Medical: Not on file    Non-medical: Not on file  Tobacco Use  . Smoking status: Never Smoker  . Smokeless tobacco: Never Used  Substance and Sexual Activity  . Alcohol use: Yes    Comment: rare  . Drug use: No  . Sexual activity: Not on file  Lifestyle  . Physical activity:    Days per week: Not on file    Minutes per session: Not on file  . Stress: Not on file  Relationships  . Social connections:    Talks on  phone: Not on file    Gets together: Not on file    Attends religious service: Not on file    Active member of club or organization: Not on file    Attends meetings of clubs or organizations: Not on file    Relationship status: Not on file  Other Topics Concern  . Not on file  Social History Narrative   Not exercising regularly    Family History  Problem Relation Age of Onset  . Diabetes Mother   . Leukemia Mother   . Alcohol abuse Father   . Lung cancer Father        smoker  . Diabetes Brother   . Heart attack Maternal Uncle        X3 ; MIs in 78s & 57s  . Stroke Maternal Uncle        in his 52s    Review of Systems  Constitutional: Negative for chills and fever.  Respiratory: Negative for cough, shortness of breath and wheezing.   Cardiovascular: Negative for chest pain, palpitations and leg swelling.  Musculoskeletal: Positive for myalgias (buttock muscle spasms).  Neurological: Positive for headaches (stress related). Negative for light-headedness.  Psychiatric/Behavioral: Negative for dysphoric mood.       Objective:   Vitals:   06/26/18 1036  BP: 118/74  Pulse: 79  Resp: 16  Temp: 98.8 F (37.1 C)  SpO2: 97%   BP Readings from Last 3 Encounters:  06/26/18 118/74  03/23/18 124/79  01/17/18 (!) 148/68   Wt Readings from Last 3 Encounters:  06/26/18 231 lb 12.8 oz (105.1 kg)  03/23/18 231 lb (104.8 kg)  01/17/18 235 lb (106.6 kg)   Body mass index is 37.41 kg/m.   Physical Exam    Constitutional: Appears well-developed and  well-nourished. No distress.  HENT:  Head: Normocephalic and atraumatic.  Neck: Neck supple. No tracheal deviation present. No thyromegaly present.  No cervical lymphadenopathy Cardiovascular: Normal rate, regular rhythm and normal heart sounds.   No murmur heard. No carotid bruit .  No edema Pulmonary/Chest: Effort normal and breath sounds normal. No respiratory distress. No has no wheezes. No rales. Neurological: No resting tremor or tremor with activity Skin: Skin is warm and dry. Not diaphoretic.  Psychiatric: Slightly anxious mood and affect. Behavior is normal.      Assessment & Plan:    See Problem List for Assessment and Plan of chronic medical problems.

## 2018-06-26 ENCOUNTER — Ambulatory Visit (INDEPENDENT_AMBULATORY_CARE_PROVIDER_SITE_OTHER): Payer: 59 | Admitting: Internal Medicine

## 2018-06-26 ENCOUNTER — Other Ambulatory Visit: Payer: Self-pay

## 2018-06-26 ENCOUNTER — Encounter: Payer: Self-pay | Admitting: Internal Medicine

## 2018-06-26 ENCOUNTER — Other Ambulatory Visit (INDEPENDENT_AMBULATORY_CARE_PROVIDER_SITE_OTHER): Payer: 59

## 2018-06-26 VITALS — BP 118/74 | HR 79 | Temp 98.8°F | Resp 16 | Ht 66.0 in | Wt 231.8 lb

## 2018-06-26 DIAGNOSIS — E7849 Other hyperlipidemia: Secondary | ICD-10-CM

## 2018-06-26 DIAGNOSIS — R7303 Prediabetes: Secondary | ICD-10-CM | POA: Diagnosis not present

## 2018-06-26 DIAGNOSIS — E611 Iron deficiency: Secondary | ICD-10-CM

## 2018-06-26 DIAGNOSIS — R251 Tremor, unspecified: Secondary | ICD-10-CM | POA: Insufficient documentation

## 2018-06-26 DIAGNOSIS — I10 Essential (primary) hypertension: Secondary | ICD-10-CM

## 2018-06-26 DIAGNOSIS — R413 Other amnesia: Secondary | ICD-10-CM | POA: Diagnosis not present

## 2018-06-26 DIAGNOSIS — E038 Other specified hypothyroidism: Secondary | ICD-10-CM | POA: Diagnosis not present

## 2018-06-26 LAB — COMPREHENSIVE METABOLIC PANEL
ALT: 32 U/L (ref 0–35)
AST: 24 U/L (ref 0–37)
Albumin: 4.5 g/dL (ref 3.5–5.2)
Alkaline Phosphatase: 59 U/L (ref 39–117)
BUN: 17 mg/dL (ref 6–23)
CO2: 26 mEq/L (ref 19–32)
Calcium: 9.8 mg/dL (ref 8.4–10.5)
Chloride: 100 mEq/L (ref 96–112)
Creatinine, Ser: 1.05 mg/dL (ref 0.40–1.20)
GFR: 52.91 mL/min — ABNORMAL LOW (ref >60.00)
Glucose, Bld: 83 mg/dL (ref 70–99)
Potassium: 4.5 mEq/L (ref 3.5–5.1)
Sodium: 137 mEq/L (ref 135–145)
Total Bilirubin: 0.5 mg/dL (ref 0.2–1.2)
Total Protein: 7.4 g/dL (ref 6.0–8.3)

## 2018-06-26 LAB — HEMOGLOBIN A1C: Hgb A1c MFr Bld: 6.5 % (ref 4.6–6.5)

## 2018-06-26 LAB — VITAMIN B12: Vitamin B-12: 1172 pg/mL — ABNORMAL HIGH (ref 211–911)

## 2018-06-26 LAB — TSH: TSH: 2.12 u[IU]/mL (ref 0.35–4.50)

## 2018-06-26 NOTE — Assessment & Plan Note (Signed)
Having increased sweating, which could be related to thyroid function Also having increased anxiety Check TSH We will titrate medication if needed

## 2018-06-26 NOTE — Patient Instructions (Addendum)
  Tests ordered today. Your results will be released to MyChart (or called to you) after review, usually within 72hours after test completion. If any changes need to be made, you will be notified at that same time.  Medications reviewed and updated.  Changes include :   none      Please followup in 6 months   

## 2018-06-26 NOTE — Assessment & Plan Note (Signed)
No known family history of tremor ?  Better after stopping her vitamins 1 month ago Has decreased caffeine Has some anxiety, but no other obvious causes Discussed neurology referral-she will think about it and let me know

## 2018-06-26 NOTE — Assessment & Plan Note (Signed)
Check lipid panel  Continue daily statin Regular exercise and healthy diet encouraged  

## 2018-06-26 NOTE — Assessment & Plan Note (Signed)
Check a1c Low sugar / carb diet Stressed regular exercise   

## 2018-06-26 NOTE — Assessment & Plan Note (Signed)
She continues to have some concerns regarding her memory She was taking B12 daily, but stopped it about a month ago There is increased anxiety/stress through work, but she denies any depression She is exercising We will check TSH, B12 Discussed referral to neurology for further evaluation-she would like to think about this

## 2018-06-26 NOTE — Assessment & Plan Note (Signed)
BP well controlled Current regimen effective and well tolerated Continue current medications at current doses cmp  

## 2018-06-26 NOTE — Assessment & Plan Note (Signed)
She did stop taking the iron about a month ago.  Because she was concerned it could have been causing an increase in tremor Will check iron panel

## 2018-06-27 ENCOUNTER — Encounter: Payer: Self-pay | Admitting: Internal Medicine

## 2018-06-27 LAB — THYROID ANTIBODIES
Thyroglobulin Ab: <1 IU/mL (ref <=1)
Thyroperoxidase Ab SerPl-aCnc: 2 IU/mL (ref <9)

## 2018-06-27 LAB — IRON,TIBC AND FERRITIN PANEL
%SAT: 23 % (calc) (ref 16–45)
Ferritin: 360 ng/mL — ABNORMAL HIGH (ref 16–288)
Iron: 76 ug/dL (ref 45–160)
TIBC: 326 mcg/dL (calc) (ref 250–450)

## 2018-08-20 ENCOUNTER — Other Ambulatory Visit: Payer: Self-pay | Admitting: Internal Medicine

## 2018-08-24 ENCOUNTER — Other Ambulatory Visit: Payer: Self-pay | Admitting: Internal Medicine

## 2018-10-26 ENCOUNTER — Ambulatory Visit (INDEPENDENT_AMBULATORY_CARE_PROVIDER_SITE_OTHER): Payer: Self-pay

## 2018-10-26 DIAGNOSIS — Z23 Encounter for immunization: Secondary | ICD-10-CM

## 2018-11-01 ENCOUNTER — Ambulatory Visit: Payer: 59

## 2018-12-25 NOTE — Progress Notes (Signed)
Subjective:    Patient ID: [name] [name], female    DOB: 04/29/1955, 63 y.o.   MRN: [id number]  HPI The patient is here for follow up.  She is not exercising regularly due to back, hip pain.    Hypertension: She is taking her medication daily. She is compliant with a low sodium diet.  She denies chest pain, palpitations, edema, shortness of breath and regular headaches.     Diabetes: She is taking her medication daily as prescribed. She is compliant with a diabetic diet.  She denies numbness/tingling in her feet and foot lesions.    Hyperlipidemia: She is taking her medication daily. She is compliant with a low fat/cholesterol diet. She denies myalgias.   Hypothyroidism:  She is taking her medication daily.  She denies any recent changes in energy or weight that are unexplained.   Anxiety: She is taking her medication daily as prescribed. She denies any side effects from the medication. She feels her anxiety is well controlled and she is happy with her current dose of medication.   Tremors:  She has tremors in her hands and it is getting worse. Her right hand is worse than the left hand.  She has it at rest and with activity.    Lower back pain:  She thinks she got it putting down stone in her yard.  She has lower back and left hip pain with walking a lot.  She has pain in her upper lateral left leg.  She has taken diclofenac for the pain as needed, which has helped.  She is doing PT exercises from the Internet.     Medications and allergies reviewed with patient and updated if appropriate.  Patient Active Problem List   Diagnosis Date Noted   Tremor of both hands 06/26/2018   Vitamin D deficiency 02/06/2017   Iron deficiency 02/06/2017   Herniated cervical disc 06/17/2015   Diabetes mellitus without complication ([place]) 0000000   Palpitations 06/17/2015   Anxiety state 03/28/2015   Nonspecific abnormal electrocardiogram (ECG) (EKG) 03/20/2014   OSA  (obstructive sleep apnea) 09/06/2012   Allergic rhinoconjunctivitis, seasonal and perennial 07/02/2012   History of colonic polyps [id number]   METABOLIC SYNDROME X 99991111   GERD 09/11/2008   CHOLELITHIASIS 09/11/2008   FATTY LIVER DISEASE, HX OF 09/11/2008   Hypothyroidism 09/11/2007   Hyperlipidemia 09/11/2007   Essential hypertension 09/11/2007   Memory loss 09/11/2007   CARCINOMA, BASAL CELL, HX OF 05/09/2006    Current Outpatient Medications on File Prior to Visit  Medication Sig Dispense Refill   albuterol (PROVENTIL HFA;VENTOLIN HFA) 108 (90 Base) MCG/ACT inhaler Inhale 1-2 puffs into the lungs every 6 (six) hours as needed. 1 Inhaler 0   diclofenac (VOLTAREN) 75 MG EC tablet Take 1 tablet by mouth 2 (two) times daily.     fluticasone (FLONASE) 50 MCG/ACT nasal spray Use 2 sprays each nostril twice daily for 3 days, then once daily. 16 g 1   losartan (COZAAR) 100 MG tablet TAKE 1 TABLET BY MOUTH  DAILY 90 tablet 1   meloxicam (MOBIC) 15 MG tablet Take 15 mg by mouth daily as needed.     minoxidil (LONITEN) 2.5 MG tablet Take 2.5 mg by mouth daily.     rosuvastatin (CRESTOR) 20 MG tablet TAKE 1 TABLET BY MOUTH  EVERY DAY 90 tablet 1   sertraline (ZOLOFT) 100 MG tablet TAKE 1 TABLET BY MOUTH  DAILY 90 tablet 1   spironolactone (ALDACTONE) 50 MG tablet  Take 50 mg by mouth daily.     SYNTHROID 112 MCG tablet TAKE 1 TABLET BY MOUTH  DAILY 90 tablet 1   Biotin 10 MG CAPS Taking daily (Patient not taking: Reported on 06/26/2018)     Cholecalciferol (VITAMIN D-3) 5000 units TABS 5000 units daily (Patient not taking: Reported on 06/26/2018) 30 tablet    Cyanocobalamin (B-12) 1000 MCG TABS Take 1,000 mcg by mouth daily. (Patient not taking: Reported on 06/26/2018) 30 tablet    ferrous sulfate (FERROUSUL) 325 (65 FE) MG tablet Taking 2 tabs daily (Patient not taking: Reported on 06/26/2018)  3   No current facility-administered medications on file prior to visit.      Past Medical History:  Diagnosis Date   Arthritis    Cholelithiasis    Hepatic steatosis    Hyperlipemia    Hypertension    Hypothyroidism    PONV (postoperative nausea and vomiting)     Past Surgical History:  Procedure Laterality Date   ABDOMINAL HYSTERECTOMY     @ age 31, due to fibroids    COLONOSCOPY W/ POLYPECTOMY  2010    Dr.Jacobs, due 2015    DILATION AND CURETTAGE OF UTERUS     MOHS SURGERY  2004   Basal Cell    TONSILLECTOMY     TOTAL KNEE ARTHROPLASTY  09/22/2011   Dr Nicholes Stairs, Left;    Social History   Socioeconomic History   Marital status: Married    Spouse name: Not on file   Number of children: Not on file   Years of education: Not on file   Highest education level: Not on file  Occupational History   Not on file  Social Needs   Financial resource strain: Not on file   Food insecurity    Worry: Not on file    Inability: Not on file   Transportation needs    Medical: Not on file    Non-medical: Not on file  Tobacco Use   Smoking status: Never Smoker   Smokeless tobacco: Never Used  Substance and Sexual Activity   Alcohol use: Yes    Comment: rare   Drug use: No   Sexual activity: Not on file  Lifestyle   Physical activity    Days per week: Not on file    Minutes per session: Not on file   Stress: Not on file  Relationships   Social connections    Talks on phone: Not on file    Gets together: Not on file    Attends religious service: Not on file    Active member of club or organization: Not on file    Attends meetings of clubs or organizations: Not on file    Relationship status: Not on file  Other Topics Concern   Not on file  Social History Narrative   Not exercising regularly    Family History  Problem Relation Age of Onset   Diabetes Mother    Leukemia Mother    Alcohol abuse Father    Lung cancer Father        smoker   Diabetes Brother    Heart attack Maternal Uncle        X3 ;  MIs in 26s & 80s   Stroke Maternal Uncle        in his 69s    Review of Systems  Constitutional: Negative for chills and fever.  Respiratory: Negative for cough, shortness of breath and wheezing.   Cardiovascular: Negative for chest  pain, palpitations and leg swelling.  Neurological: Positive for numbness (left upper leg - tingly) and headaches (sinus). Negative for light-headedness.  Psychiatric/Behavioral: Positive for dysphoric mood (maybe low grade). The patient is nervous/anxious (controlled).        Objective:   Vitals:   12/26/18 0836  BP: 126/72  Pulse: 82  Temp: 98.1 F (36.7 C)  SpO2: 98%   BP Readings from Last 3 Encounters:  12/26/18 126/72  06/26/18 118/74  03/23/18 124/79   Wt Readings from Last 3 Encounters:  12/26/18 233 lb (105.7 kg)  06/26/18 231 lb 12.8 oz (105.1 kg)  03/23/18 231 lb (104.8 kg)   Body mass index is 37.61 kg/m.   Physical Exam    Constitutional: Appears well-developed and well-nourished. No distress.  HENT:  Head: Normocephalic and atraumatic.  Neck: Neck supple. No tracheal deviation present. No thyromegaly present.  No cervical lymphadenopathy Cardiovascular: Normal rate, regular rhythm and normal heart sounds.   No murmur heard. No carotid bruit .  No edema Pulmonary/Chest: Effort normal and breath sounds normal. No respiratory distress. No has no wheezes. No rales.  Skin: Skin is warm and dry. Not diaphoretic.  Psychiatric: Normal mood and affect. Behavior is normal.      Assessment & Plan:    See Problem List for Assessment and Plan of chronic medical problems.     This visit occurred during the SARS-CoV-2 public health emergency.  Safety protocols were in place, including screening questions prior to the visit, additional usage of staff PPE, and extensive cleaning of exam room while observing appropriate contact time as indicated for disinfecting solutions.

## 2018-12-25 NOTE — Patient Instructions (Addendum)
  Tests ordered today. Your results will be released to [place] (or called to you) after review.  If any changes need to be made, you will be notified at that same time.   Medications reviewed and updated.  Changes include :  Try gabapentin at night - this can be adjusted.  Try decreasing sertraline to 50 mg daily.  Use the diclofenac as needed for the back pain.    Your prescription(s) have been submitted to your pharmacy. Please take as directed and contact our office if you believe you are having problem(s) with the medication(s).    Please followup in 6 months

## 2018-12-26 ENCOUNTER — Other Ambulatory Visit (INDEPENDENT_AMBULATORY_CARE_PROVIDER_SITE_OTHER): Payer: No Typology Code available for payment source

## 2018-12-26 ENCOUNTER — Encounter: Payer: Self-pay | Admitting: Internal Medicine

## 2018-12-26 ENCOUNTER — Ambulatory Visit (INDEPENDENT_AMBULATORY_CARE_PROVIDER_SITE_OTHER): Payer: No Typology Code available for payment source | Admitting: Internal Medicine

## 2018-12-26 ENCOUNTER — Other Ambulatory Visit: Payer: Self-pay

## 2018-12-26 VITALS — BP 126/72 | HR 82 | Temp 98.1°F | Ht 66.0 in | Wt 233.0 lb

## 2018-12-26 DIAGNOSIS — E559 Vitamin D deficiency, unspecified: Secondary | ICD-10-CM

## 2018-12-26 DIAGNOSIS — F411 Generalized anxiety disorder: Secondary | ICD-10-CM | POA: Diagnosis not present

## 2018-12-26 DIAGNOSIS — E119 Type 2 diabetes mellitus without complications: Secondary | ICD-10-CM | POA: Diagnosis not present

## 2018-12-26 DIAGNOSIS — R251 Tremor, unspecified: Secondary | ICD-10-CM

## 2018-12-26 DIAGNOSIS — I10 Essential (primary) hypertension: Secondary | ICD-10-CM

## 2018-12-26 DIAGNOSIS — M5416 Radiculopathy, lumbar region: Secondary | ICD-10-CM

## 2018-12-26 DIAGNOSIS — E669 Obesity, unspecified: Secondary | ICD-10-CM | POA: Insufficient documentation

## 2018-12-26 DIAGNOSIS — E7849 Other hyperlipidemia: Secondary | ICD-10-CM

## 2018-12-26 DIAGNOSIS — Z6837 Body mass index (BMI) 37.0-37.9, adult: Secondary | ICD-10-CM

## 2018-12-26 DIAGNOSIS — E039 Hypothyroidism, unspecified: Secondary | ICD-10-CM

## 2018-12-26 HISTORY — DX: Radiculopathy, lumbar region: M54.16

## 2018-12-26 LAB — CBC WITH DIFFERENTIAL/PLATELET
Basophils Absolute: 0 10*3/uL (ref 0.0–0.1)
Basophils Relative: 0.8 % (ref 0.0–3.0)
Eosinophils Absolute: 0.2 10*3/uL (ref 0.0–0.7)
Eosinophils Relative: 3.8 % (ref 0.0–5.0)
HCT: 45.5 % (ref 36.0–46.0)
Hemoglobin: 15.2 g/dL — ABNORMAL HIGH (ref 12.0–15.0)
Lymphocytes Relative: 21.8 % (ref 12.0–46.0)
Lymphs Abs: 1.3 10*3/uL (ref 0.7–4.0)
MCHC: 33.4 g/dL (ref 30.0–36.0)
MCV: 88.2 fl (ref 78.0–100.0)
Monocytes Absolute: 0.4 10*3/uL (ref 0.1–1.0)
Monocytes Relative: 6.7 % (ref 3.0–12.0)
Neutro Abs: 4.1 10*3/uL (ref 1.4–7.7)
Neutrophils Relative %: 66.9 % (ref 43.0–77.0)
Platelets: 296 10*3/uL (ref 150.0–400.0)
RBC: 5.16 Mil/uL — ABNORMAL HIGH (ref 3.87–5.11)
RDW: 13.2 % (ref 11.5–15.5)
WBC: 6.1 10*3/uL (ref 4.0–10.5)

## 2018-12-26 LAB — LIPID PANEL
Cholesterol: 132 mg/dL (ref 0–200)
HDL: 36.5 mg/dL — ABNORMAL LOW (ref >39.00)
LDL Cholesterol: 63 mg/dL (ref 0–99)
NonHDL: 95.62
Total CHOL/HDL Ratio: 4
Triglycerides: 161 mg/dL — ABNORMAL HIGH (ref 0.0–149.0)
VLDL: 32.2 mg/dL (ref 0.0–40.0)

## 2018-12-26 LAB — COMPREHENSIVE METABOLIC PANEL
ALT: 19 U/L (ref 0–35)
AST: 18 U/L (ref 0–37)
Albumin: 4.4 g/dL (ref 3.5–5.2)
Alkaline Phosphatase: 62 U/L (ref 39–117)
BUN: 20 mg/dL (ref 6–23)
CO2: 26 mEq/L (ref 19–32)
Calcium: 9.2 mg/dL (ref 8.4–10.5)
Chloride: 105 mEq/L (ref 96–112)
Creatinine, Ser: 0.85 mg/dL (ref 0.40–1.20)
GFR: 67.41 mL/min (ref >60.00)
Glucose, Bld: 111 mg/dL — ABNORMAL HIGH (ref 70–99)
Potassium: 4 mEq/L (ref 3.5–5.1)
Sodium: 140 mEq/L (ref 135–145)
Total Bilirubin: 0.6 mg/dL (ref 0.2–1.2)
Total Protein: 6.8 g/dL (ref 6.0–8.3)

## 2018-12-26 LAB — VITAMIN D 25 HYDROXY (VIT D DEFICIENCY, FRACTURES): VITD: 39.36 ng/mL (ref 30.00–100.00)

## 2018-12-26 LAB — TSH: TSH: 1.43 u[IU]/mL (ref 0.35–4.50)

## 2018-12-26 LAB — HEMOGLOBIN A1C: Hgb A1c MFr Bld: 6 % (ref 4.6–6.5)

## 2018-12-26 MED ORDER — GABAPENTIN 100 MG PO CAPS: 100.0000 mg | ORAL_CAPSULE | Freq: Every day | ORAL | 3 refills | Status: DC

## 2018-12-26 MED ORDER — DICLOFENAC SODIUM 75 MG PO TBEC: 75.0000 mg | DELAYED_RELEASE_TABLET | Freq: Two times a day (BID) | ORAL | 3 refills | Status: DC

## 2018-12-26 NOTE — Assessment & Plan Note (Signed)
Clinically euthyroid Check tsh  Titrate med dose if needed  

## 2018-12-26 NOTE — Assessment & Plan Note (Signed)
Started after putting down stone in her yard Left lower back pain to upper thigh Some numbness Diclofenac helping - continue prn Start gabapentin Continue PT exercises Call if no improvement

## 2018-12-26 NOTE — Assessment & Plan Note (Addendum)
Controlled, stable ? Make tremor worse -- advised her to try 50 mg daily to see if her anxiety is still controlled and tremor improves

## 2018-12-26 NOTE — Assessment & Plan Note (Addendum)
With htn, hichol, dm Stressed weight loss Encouraged regular exercise Discussed low carbs/sugars and smaller portions Discussed nutrition referral - deferred FU in 6 months

## 2018-12-26 NOTE — Assessment & Plan Note (Signed)
Not taking vitamin d Check level

## 2018-12-26 NOTE — Assessment & Plan Note (Signed)
Has gotten worse ? Benign tremor ? Worse with sertraline - she will try decreasing dose Can refer to neuro if she wants

## 2018-12-26 NOTE — Assessment & Plan Note (Addendum)
BP well controlled Current regimen effective and well tolerated Continue current medications at current doses cmp  

## 2018-12-26 NOTE — Assessment & Plan Note (Addendum)
New since 05/2018 Diet controlled Check a1c Low sugar / carb diet Stressed regular exercise Deferred nutrition

## 2018-12-26 NOTE — Assessment & Plan Note (Signed)
Check lipid panel,cmp ,tsh Continue daily statin Regular exercise and healthy diet encouraged  

## 2019-03-11 ENCOUNTER — Other Ambulatory Visit: Payer: Self-pay | Admitting: Internal Medicine

## 2019-05-06 ENCOUNTER — Other Ambulatory Visit: Payer: Self-pay | Admitting: Internal Medicine

## 2019-06-26 ENCOUNTER — Ambulatory Visit: Payer: No Typology Code available for payment source | Admitting: Internal Medicine

## 2019-07-08 ENCOUNTER — Telehealth: Payer: Self-pay | Admitting: Internal Medicine

## 2019-07-08 NOTE — Telephone Encounter (Signed)
New Message:   1.Medication Requested: SYNTHROID 112 MCG tablet (generic if possible)  2. Pharmacy (Name, [facility]): [facility] [place], [address] 3. On Med List: Yes  4. Last Visit with PCP: 12/26/18  5. Next visit date with PCP: None   Agent: Please be advised that RX refills may take up to 3 business days. We ask that you follow-up with your pharmacy.

## 2019-07-09 ENCOUNTER — Other Ambulatory Visit: Payer: Self-pay

## 2019-07-09 MED ORDER — SYNTHROID 112 MCG PO TABS: ORAL_TABLET | ORAL | 1 refills | Status: DC

## 2019-07-09 NOTE — Telephone Encounter (Signed)
Sent in this morning

## 2019-07-15 ENCOUNTER — Other Ambulatory Visit: Payer: Self-pay | Admitting: Internal Medicine

## 2019-07-15 MED ORDER — GABAPENTIN 100 MG PO CAPS: 100.0000 mg | ORAL_CAPSULE | Freq: Every day | ORAL | 0 refills | Status: DC

## 2019-08-19 ENCOUNTER — Telehealth (INDEPENDENT_AMBULATORY_CARE_PROVIDER_SITE_OTHER): Payer: Self-pay | Admitting: Family

## 2019-08-19 DIAGNOSIS — J209 Acute bronchitis, unspecified: Secondary | ICD-10-CM

## 2019-08-19 MED ORDER — ALBUTEROL SULFATE HFA 108 (90 BASE) MCG/ACT IN AERS
2.0000 | INHALATION_SPRAY | Freq: Four times a day (QID) | RESPIRATORY_TRACT | 0 refills | Status: DC | PRN
Start: 2019-08-19 — End: 2020-03-31

## 2019-08-19 MED ORDER — AZITHROMYCIN 250 MG PO TABS: ORAL_TABLET | ORAL | 0 refills | Status: DC

## 2019-08-19 MED ORDER — BENZONATATE 200 MG PO CAPS: 200.0000 mg | ORAL_CAPSULE | Freq: Three times a day (TID) | ORAL | 0 refills | Status: DC | PRN

## 2019-08-19 NOTE — Progress Notes (Signed)
[name] [name] is a 64 y.o. female with the following history as recorded in EpicCare:  Patient Active Problem List   Diagnosis Date Noted  . Obese 12/26/2018  . Lumbar radiculopathy 12/26/2018  . Tremor of both hands 06/26/2018  . Vitamin D deficiency 02/06/2017  . Iron deficiency 02/06/2017  . Herniated cervical disc 06/17/2015  . Diabetes mellitus without complication ([facility]) 63/14/9702  . Palpitations 06/17/2015  . Anxiety state 03/28/2015  . Nonspecific abnormal electrocardiogram (ECG) (EKG) 03/20/2014  . OSA (obstructive sleep apnea) 09/06/2012  . Allergic rhinoconjunctivitis, seasonal and perennial 07/02/2012  . History of colonic polyps 02/09/2010  . METABOLIC SYNDROME X 63/78/5885  . GERD 09/11/2008  . CHOLELITHIASIS 09/11/2008  . FATTY LIVER DISEASE, HX OF 09/11/2008  . Hypothyroidism 09/11/2007  . Hyperlipidemia 09/11/2007  . Essential hypertension 09/11/2007  . Memory loss 09/11/2007  . CARCINOMA, BASAL CELL, HX OF 05/09/2006    Current Outpatient Medications  Medication Sig Dispense Refill  . albuterol (PROVENTIL HFA;VENTOLIN HFA) 108 (90 Base) MCG/ACT inhaler Inhale 1-2 puffs into the lungs every 6 (six) hours as needed. (Patient not taking: Reported on 08/19/2019) 1 Inhaler 0  . albuterol (VENTOLIN HFA) 108 (90 Base) MCG/ACT inhaler Inhale 2 puffs into the lungs every 6 (six) hours as needed for wheezing or shortness of breath. 6.7 g 0  . azithromycin (ZITHROMAX) 250 MG tablet 2 tabs po qd x 1 day; 1 tablet per day x 4 days; 6 tablet 0  . benzonatate (TESSALON) 200 MG capsule Take 1 capsule (200 mg total) by mouth 3 (three) times daily as needed. 30 capsule 0  . Biotin 10 MG CAPS Taking daily (Patient not taking: Reported on 06/26/2018)    . Cholecalciferol (VITAMIN D-3) 5000 units TABS 5000 units daily (Patient not taking: Reported on 06/26/2018) 30 tablet   . Cyanocobalamin (B-12) 1000 MCG TABS Take 1,000 mcg by mouth daily. (Patient not taking: Reported on  06/26/2018) 30 tablet   . diclofenac (VOLTAREN) 75 MG EC tablet Take 1 tablet (75 mg total) by mouth 2 (two) times daily. 60 tablet 3  . ferrous sulfate (FERROUSUL) 325 (65 FE) MG tablet Taking 2 tabs daily (Patient not taking: Reported on 06/26/2018)  3  . fluticasone (FLONASE) 50 MCG/ACT nasal spray Use 2 sprays each nostril twice daily for 3 days, then once daily. 16 g 1  . gabapentin (NEURONTIN) 100 MG capsule Take 1-2 capsules (100-200 mg total) by mouth at bedtime. Needs f/u visit for additional refills 60 capsule 0  . losartan (COZAAR) 100 MG tablet TAKE 1 TABLET BY MOUTH DAILY 90 tablet 1  . meloxicam (MOBIC) 15 MG tablet Take 15 mg by mouth daily as needed.    . minoxidil (LONITEN) 2.5 MG tablet Take 2.5 mg by mouth daily.    . rosuvastatin (CRESTOR) 20 MG tablet TAKE 1 TABLET BY MOUTH DAILY 90 tablet 1  . sertraline (ZOLOFT) 100 MG tablet TAKE 1 TABLET BY MOUTH  DAILY 90 tablet 1  . spironolactone (ALDACTONE) 50 MG tablet Take 50 mg by mouth daily.    Marland Kitchen SYNTHROID 112 MCG tablet TAKE 1 TABLET BY MOUTH  DAILY (Patient due for 6 month follow up) 90 tablet 1   No current facility-administered medications for this visit.    Allergies: Ampicillin, Doxycycline, Oxycodone, and Penicillins  Past Medical History:  Diagnosis Date  . Arthritis   . Cholelithiasis   . Hepatic steatosis   . Hyperlipemia   . Hypertension   . Hypothyroidism   .  PONV (postoperative nausea and vomiting)     Past Surgical History:  Procedure Laterality Date  . ABDOMINAL HYSTERECTOMY     @ age 6, due to fibroids   . COLONOSCOPY W/ POLYPECTOMY  2010    Dr.Jacobs, due 2015   . DILATION AND CURETTAGE OF UTERUS    . MOHS SURGERY  2004   Basal Cell   . TONSILLECTOMY    . TOTAL KNEE ARTHROPLASTY  09/22/2011   Dr Nicholes Stairs, Left;    Family History  Problem Relation Age of Onset  . Diabetes Mother   . Leukemia Mother   . Alcohol abuse Father   . Lung cancer Father        smoker  . Diabetes Brother   . Heart  attack Maternal Uncle        X3 ; MIs in 88s & 29s  . Stroke Maternal Uncle        in his 75s    Social History   Tobacco Use  . Smoking status: Never Smoker  . Smokeless tobacco: Never Used  Substance Use Topics  . Alcohol use: Yes    Comment: rare    Subjective:   I connected with [name] [name] on 08/19/19 at  9:40 AM EDT by a video enabled telemedicine application and verified that I am speaking with the correct person using two identifiers.   I discussed the limitations of evaluation and management by telemedicine and the availability of in person appointments. The patient expressed understanding and agreed to proceed. Provider in office/ patient is at home; provider and patient are only 2 people on video call.   1 week history of cough/ congestion; + runny nose; + chest tightness; has been running a low grade fever; is prone to bronchitis- does not have current active prescription for albuterol; is fully vaccinated against COVID- Moderna completed earlier this year; is adamant that she does not want to take prednisone- feels the side effects are worse than the benefits;     Objective:  There were no vitals filed for this visit.  General: Well developed, well nourished, in no acute distress  Head: Normocephalic and atraumatic  Lungs: Respirations unlabored;  Neurologic: Alert and oriented; speech intact; face symmetrical;   Assessment:  1. Acute bronchitis, unspecified organism     Plan:  Rx for Z-pak #1 take as directed, Tessalon Perles 200 mg tid and albuterol inhaler; increase fluids, rest and follow-up worse, no better; if she is not improving within 24-48 hours, she will get a COVID test;   No follow-ups on file.  No orders of the defined types were placed in this encounter.   Requested Prescriptions   Signed Prescriptions Disp Refills  . azithromycin (ZITHROMAX) 250 MG tablet 6 tablet 0    Sig: 2 tabs po qd x 1 day; 1 tablet per day x 4 days;  .  benzonatate (TESSALON) 200 MG capsule 30 capsule 0    Sig: Take 1 capsule (200 mg total) by mouth 3 (three) times daily as needed.  Marland Kitchen albuterol (VENTOLIN HFA) 108 (90 Base) MCG/ACT inhaler 6.7 g 0    Sig: Inhale 2 puffs into the lungs every 6 (six) hours as needed for wheezing or shortness of breath.

## 2019-08-22 ENCOUNTER — Ambulatory Visit: Payer: HRSA Program | Attending: Internal Medicine

## 2019-08-22 DIAGNOSIS — Z20822 Contact with and (suspected) exposure to covid-19: Secondary | ICD-10-CM

## 2019-08-23 LAB — NOVEL CORONAVIRUS, NAA: SARS-CoV-2, NAA: NOT DETECTED

## 2019-08-23 LAB — SARS-COV-2, NAA 2 DAY TAT

## 2019-10-09 ENCOUNTER — Other Ambulatory Visit: Payer: Self-pay | Admitting: Internal Medicine

## 2019-10-19 NOTE — Progress Notes (Signed)
Subjective:    Patient ID: [name] [name], female    DOB: 28-Dec-1955, 64 y.o.   MRN: [id number]  HPI The patient is here for follow up of their chronic medical problems, including  Htn, DM, hyperlipidemia, hypothyroidism, anxiety, vitamin d def  She is exercising regularly.     She is taking gabapentin and meloxicam for her back pain.    Overall she feels good, except for her back pain.  She plans on seeing a back specialist in the next month or two.   Medications and allergies reviewed with patient and updated if appropriate.  Patient Active Problem List   Diagnosis Date Noted   Obese 12/26/2018   Lumbar radiculopathy 12/26/2018   Tremor of both hands 06/26/2018   Vitamin D deficiency 02/06/2017   Iron deficiency 02/06/2017   Herniated cervical disc 06/17/2015   Diabetes mellitus without complication ([facility]) 93/71/6967   Palpitations 06/17/2015   Anxiety state 03/28/2015   Nonspecific abnormal electrocardiogram (ECG) (EKG) 03/20/2014   OSA (obstructive sleep apnea) 09/06/2012   Allergic rhinoconjunctivitis, seasonal and perennial 07/02/2012   History of colonic polyps 89/38/1017   METABOLIC SYNDROME X 51/02/5850   GERD 09/11/2008   CHOLELITHIASIS 09/11/2008   FATTY LIVER DISEASE, HX OF 09/11/2008   Hypothyroidism 09/11/2007   Hyperlipidemia 09/11/2007   Essential hypertension 09/11/2007   Memory loss 09/11/2007   CARCINOMA, BASAL CELL, HX OF 05/09/2006    Current Outpatient Medications on File Prior to Visit  Medication Sig Dispense Refill   albuterol (VENTOLIN HFA) 108 (90 Base) MCG/ACT inhaler Inhale 2 puffs into the lungs every 6 (six) hours as needed for wheezing or shortness of breath. 6.7 g 0   diclofenac (VOLTAREN) 75 MG EC tablet Take 1 tablet (75 mg total) by mouth 2 (two) times daily. 60 tablet 3   fluticasone (FLONASE) 50 MCG/ACT nasal spray Use 2 sprays each nostril twice daily for 3 days, then once daily. 16 g 1    gabapentin (NEURONTIN) 100 MG capsule Take 1-2 capsules (100-200 mg total) by mouth at bedtime. Needs f/u visit for additional refills 60 capsule 0   losartan (COZAAR) 100 MG tablet TAKE 1 TABLET BY MOUTH DAILY 90 tablet 1   meloxicam (MOBIC) 15 MG tablet Take 15 mg by mouth daily as needed.     rosuvastatin (CRESTOR) 20 MG tablet TAKE ONE TABLET BY MOUTH DAILY Annual appt due in Dec must see provider for future refills 90 tablet 0   sertraline (ZOLOFT) 100 MG tablet TAKE 1 TABLET BY MOUTH  DAILY 90 tablet 1   SYNTHROID 112 MCG tablet TAKE 1 TABLET BY MOUTH  DAILY (Patient due for 6 month follow up) 90 tablet 1   No current facility-administered medications on file prior to visit.    Past Medical History:  Diagnosis Date   Arthritis    Cholelithiasis    Hepatic steatosis    Hyperlipemia    Hypertension    Hypothyroidism    PONV (postoperative nausea and vomiting)     Past Surgical History:  Procedure Laterality Date   ABDOMINAL HYSTERECTOMY     @ age 26, due to fibroids    COLONOSCOPY W/ POLYPECTOMY  2010    Dr.Jacobs, due 41    DILATION AND CURETTAGE OF UTERUS     MOHS SURGERY  2004   Basal Cell    TONSILLECTOMY     TOTAL KNEE ARTHROPLASTY  09/22/2011   Dr Nicholes Stairs, Left;    Social History   Socioeconomic  History   Marital status: Married    Spouse name: Not on file   Number of children: Not on file   Years of education: Not on file   Highest education level: Not on file  Occupational History   Not on file  Tobacco Use   Smoking status: Never Smoker   Smokeless tobacco: Never Used  Vaping Use   Vaping Use: Never used  Substance and Sexual Activity   Alcohol use: Yes    Comment: rare   Drug use: No   Sexual activity: Not on file  Other Topics Concern   Not on file  Social History Narrative   Not exercising regularly   Social Determinants of Health   Financial Resource Strain:    Difficulty of Paying Living Expenses: Not on  file  Food Insecurity:    Worried About [place] in the Last Year: Not on file   Ran Out of Food in the Last Year: Not on file  Transportation Needs:    Lack of Transportation (Medical): Not on file   Lack of Transportation (Non-Medical): Not on file  Physical Activity:    Days of Exercise per Week: Not on file   Minutes of Exercise per Session: Not on file  Stress:    Feeling of Stress : Not on file  Social Connections:    Frequency of Communication with Friends and Family: Not on file   Frequency of Social Gatherings with Friends and Family: Not on file   Attends Religious Services: Not on file   Active Member of Clubs or Organizations: Not on file   Attends Archivist Meetings: Not on file   Marital Status: Not on file    Family History  Problem Relation Age of Onset   Diabetes Mother    Leukemia Mother    Alcohol abuse Father    Lung cancer Father        smoker   Diabetes Brother    Heart attack Maternal Uncle        X3 ; MIs in 61s & 10s   Stroke Maternal Uncle        in his 47s    Review of Systems  Constitutional: Positive for diaphoresis (intermittent). Negative for chills and fever.  Respiratory: Negative for cough, shortness of breath and wheezing.   Cardiovascular: Negative for chest pain, palpitations and leg swelling.  Neurological: Negative for light-headedness and headaches.       Objective:   Vitals:   10/20/19 1316  BP: 126/70  Pulse: 90  Temp: 98 F (36.7 C)  SpO2: 95%   BP Readings from Last 3 Encounters:  10/20/19 126/70  12/26/18 126/72  06/26/18 118/74   Wt Readings from Last 3 Encounters:  10/20/19 237 lb (107.5 kg)  12/26/18 233 lb (105.7 kg)  06/26/18 231 lb 12.8 oz (105.1 kg)   Body mass index is 38.25 kg/m.   Physical Exam    Constitutional: Appears well-developed and well-nourished. No distress.  HENT:  Head: Normocephalic and atraumatic.  Neck: Neck supple. No tracheal  deviation present. No thyromegaly present.  No cervical lymphadenopathy Cardiovascular: Normal rate, regular rhythm and normal heart sounds.   No murmur heard. No carotid bruit .  No edema Pulmonary/Chest: Effort normal and breath sounds normal. No respiratory distress. No has no wheezes. No rales.  Skin: Skin is warm and dry. Not diaphoretic.  Psychiatric: Normal mood and affect. Behavior is normal.      Assessment & Plan:  See Problem List for Assessment and Plan of chronic medical problems.    This visit occurred during the SARS-CoV-2 public health emergency.  Safety protocols were in place, including screening questions prior to the visit, additional usage of staff PPE, and extensive cleaning of exam room while observing appropriate contact time as indicated for disinfecting solutions.

## 2019-10-19 NOTE — Patient Instructions (Addendum)
  Blood work was ordered.  Have it done at [facility] lab when you can.    Medications reviewed and updated.  Changes include :   none  Your prescription(s) have been submitted to your pharmacy. Please take as directed and contact our office if you believe you are having problem(s) with the medication(s).    Please followup in 6 months

## 2019-10-20 ENCOUNTER — Other Ambulatory Visit: Payer: Self-pay

## 2019-10-20 ENCOUNTER — Ambulatory Visit (INDEPENDENT_AMBULATORY_CARE_PROVIDER_SITE_OTHER): Payer: Self-pay | Admitting: Internal Medicine

## 2019-10-20 ENCOUNTER — Encounter: Payer: Self-pay | Admitting: Internal Medicine

## 2019-10-20 VITALS — BP 126/70 | HR 90 | Temp 98.0°F | Wt 237.0 lb

## 2019-10-20 DIAGNOSIS — E7849 Other hyperlipidemia: Secondary | ICD-10-CM

## 2019-10-20 DIAGNOSIS — I10 Essential (primary) hypertension: Secondary | ICD-10-CM

## 2019-10-20 DIAGNOSIS — E119 Type 2 diabetes mellitus without complications: Secondary | ICD-10-CM

## 2019-10-20 DIAGNOSIS — F411 Generalized anxiety disorder: Secondary | ICD-10-CM

## 2019-10-20 DIAGNOSIS — E039 Hypothyroidism, unspecified: Secondary | ICD-10-CM

## 2019-10-20 DIAGNOSIS — Z23 Encounter for immunization: Secondary | ICD-10-CM

## 2019-10-20 MED ORDER — SYNTHROID 112 MCG PO TABS: ORAL_TABLET | ORAL | 1 refills | Status: DC

## 2019-10-20 NOTE — Assessment & Plan Note (Signed)
Chronic Check lipid panel  Continue crestor 20 mg daily Regular exercise and healthy diet encouraged  

## 2019-10-20 NOTE — Assessment & Plan Note (Signed)
Chronic BP well controlled Continue losartan 100 mg dialy cmp

## 2019-10-20 NOTE — Assessment & Plan Note (Signed)
Chronic Controlled, stable Continue  sertraline 100 mg daily 

## 2019-10-20 NOTE — Assessment & Plan Note (Signed)
Chronic  Clinically euthyroid Check tsh  Titrate med dose if needed, currently taking synthroid 112 mcg

## 2019-10-20 NOTE — Assessment & Plan Note (Signed)
Chronic Diet controlled Check a1c Low sugar / carb diet Stressed regular exercise

## 2019-11-03 ENCOUNTER — Other Ambulatory Visit: Payer: Self-pay | Admitting: Internal Medicine

## 2019-12-17 ENCOUNTER — Other Ambulatory Visit: Payer: Self-pay | Admitting: Internal Medicine

## 2019-12-25 ENCOUNTER — Other Ambulatory Visit: Payer: Self-pay

## 2019-12-25 ENCOUNTER — Encounter: Payer: Self-pay | Admitting: Internal Medicine

## 2019-12-25 ENCOUNTER — Telehealth (INDEPENDENT_AMBULATORY_CARE_PROVIDER_SITE_OTHER): Payer: Self-pay | Admitting: Internal Medicine

## 2019-12-25 DIAGNOSIS — R059 Cough, unspecified: Secondary | ICD-10-CM | POA: Insufficient documentation

## 2019-12-25 MED ORDER — AZITHROMYCIN 250 MG PO TABS: ORAL_TABLET | ORAL | 0 refills | Status: DC

## 2019-12-25 NOTE — Progress Notes (Signed)
Virtual Visit via Video Note  I connected with [name] [name] on 12/25/19 at  1:40 PM EST by a video enabled telemedicine application and verified that I am speaking with the correct person using two identifiers.  The patient and the provider were at separate locations throughout the entire encounter. Patient location: home, Provider location: work   I discussed the limitations of evaluation and management by telemedicine and the availability of in person appointments. The patient expressed understanding and agreed to proceed. The patient and the provider were the only parties present for the visit unless noted in HPI below.  History of Present Illness: The patient is a 64 y.o. female with visit for cold. Started around Thanksgiving and grandchildren had it as well. They did have covid-19 testing which was negative. They did not find out what it was. She feels it started as cold and now is sinus infection. Has pain in sinuses and some nosebleeding. Denies fevers but chills and fatigue. She has had covid-19 and booster but cannot recall date of booster. Overall it is not improving. Has tried otc medications and allergy medications including flonase and zyrtec.  Observations/Objective: Appearance: normal, coughing during visit, breathing appears normal, casual grooming, abdomen does not appear distended, throat not well visualized, mental status is A and O times 3  Assessment and Plan: See problem oriented charting  Follow Up Instructions: rx azithromycin for potential CAP  I discussed the assessment and treatment plan with the patient. The patient was provided an opportunity to ask questions and all were answered. The patient agreed with the plan and demonstrated an understanding of the instructions.   The patient was advised to call back or seek an in-person evaluation if the symptoms worsen or if the condition fails to improve as anticipated.  Hoyt Koch, MD

## 2019-12-25 NOTE — Assessment & Plan Note (Signed)
Rx azithromycin for suspected CAP. If no improvement she will contact office for further instructions.

## 2019-12-29 ENCOUNTER — Encounter: Payer: Self-pay | Admitting: Internal Medicine

## 2019-12-29 MED ORDER — BENZONATATE 200 MG PO CAPS: 200.0000 mg | ORAL_CAPSULE | Freq: Three times a day (TID) | ORAL | 0 refills | Status: DC | PRN

## 2020-01-04 ENCOUNTER — Emergency Department (HOSPITAL_BASED_OUTPATIENT_CLINIC_OR_DEPARTMENT_OTHER)
Admission: EM | Admit: 2020-01-04 | Discharge: 2020-01-04 | Disposition: A | Discharge status: Home / Self Care | Payer: Self-pay | Attending: Emergency Medicine | Admitting: Emergency Medicine

## 2020-01-04 ENCOUNTER — Other Ambulatory Visit: Payer: Self-pay

## 2020-01-04 ENCOUNTER — Encounter (HOSPITAL_BASED_OUTPATIENT_CLINIC_OR_DEPARTMENT_OTHER): Payer: Self-pay | Admitting: Emergency Medicine

## 2020-01-04 DIAGNOSIS — Z90710 Acquired absence of both cervix and uterus: Secondary | ICD-10-CM | POA: Insufficient documentation

## 2020-01-04 DIAGNOSIS — Z85828 Personal history of other malignant neoplasm of skin: Secondary | ICD-10-CM | POA: Insufficient documentation

## 2020-01-04 DIAGNOSIS — R1013 Epigastric pain: Secondary | ICD-10-CM | POA: Insufficient documentation

## 2020-01-04 DIAGNOSIS — E1169 Type 2 diabetes mellitus with other specified complication: Secondary | ICD-10-CM | POA: Insufficient documentation

## 2020-01-04 DIAGNOSIS — R197 Diarrhea, unspecified: Secondary | ICD-10-CM | POA: Insufficient documentation

## 2020-01-04 DIAGNOSIS — I10 Essential (primary) hypertension: Secondary | ICD-10-CM | POA: Insufficient documentation

## 2020-01-04 DIAGNOSIS — E039 Hypothyroidism, unspecified: Secondary | ICD-10-CM | POA: Insufficient documentation

## 2020-01-04 DIAGNOSIS — E785 Hyperlipidemia, unspecified: Secondary | ICD-10-CM | POA: Insufficient documentation

## 2020-01-04 DIAGNOSIS — Z96652 Presence of left artificial knee joint: Secondary | ICD-10-CM | POA: Insufficient documentation

## 2020-01-04 LAB — COMPREHENSIVE METABOLIC PANEL
ALT: 20 U/L (ref 0–44)
AST: 22 U/L (ref 15–41)
Albumin: 4.4 g/dL (ref 3.5–5.0)
Alkaline Phosphatase: 58 U/L (ref 38–126)
Anion gap: 10 (ref 5–15)
BUN: 15 mg/dL (ref 8–23)
CO2: 22 mmol/L (ref 22–32)
Calcium: 9.5 mg/dL (ref 8.9–10.3)
Chloride: 105 mmol/L (ref 98–111)
Creatinine, Ser: 0.77 mg/dL (ref 0.44–1.00)
GFR, Estimated: >60 mL/min (ref >60)
Glucose, Bld: 123 mg/dL — ABNORMAL HIGH (ref 70–99)
Potassium: 4.1 mmol/L (ref 3.5–5.1)
Sodium: 137 mmol/L (ref 135–145)
Total Bilirubin: 0.4 mg/dL (ref 0.3–1.2)
Total Protein: 7.8 g/dL (ref 6.5–8.1)

## 2020-01-04 LAB — C DIFFICILE QUICK SCREEN W PCR REFLEX
C Diff antigen: NEGATIVE
C Diff interpretation: NOT DETECTED
C Diff toxin: NEGATIVE

## 2020-01-04 LAB — URINALYSIS, ROUTINE W REFLEX MICROSCOPIC
Bilirubin Urine: NEGATIVE
Glucose, UA: NEGATIVE mg/dL
Ketones, ur: NEGATIVE mg/dL
Leukocytes,Ua: NEGATIVE
Nitrite: NEGATIVE
Protein, ur: NEGATIVE mg/dL
Specific Gravity, Urine: 1.02 (ref 1.005–1.030)
pH: 5.5 (ref 5.0–8.0)

## 2020-01-04 LAB — CBC WITH DIFFERENTIAL/PLATELET
Abs Immature Granulocytes: 0.04 10*3/uL (ref 0.00–0.07)
Basophils Absolute: 0 10*3/uL (ref 0.0–0.1)
Basophils Relative: 1 %
Eosinophils Absolute: 0.1 10*3/uL (ref 0.0–0.5)
Eosinophils Relative: 2 %
HCT: 47.2 % — ABNORMAL HIGH (ref 36.0–46.0)
Hemoglobin: 16.1 g/dL — ABNORMAL HIGH (ref 12.0–15.0)
Immature Granulocytes: 1 %
Lymphocytes Relative: 17 %
Lymphs Abs: 1.1 10*3/uL (ref 0.7–4.0)
MCH: 30 pg (ref 26.0–34.0)
MCHC: 34.1 g/dL (ref 30.0–36.0)
MCV: 87.9 fL (ref 80.0–100.0)
Monocytes Absolute: 0.7 10*3/uL (ref 0.1–1.0)
Monocytes Relative: 11 %
Neutro Abs: 4.6 10*3/uL (ref 1.7–7.7)
Neutrophils Relative %: 68 %
Platelets: 381 10*3/uL (ref 150–400)
RBC: 5.37 MIL/uL — ABNORMAL HIGH (ref 3.87–5.11)
RDW: 12.5 % (ref 11.5–15.5)
WBC: 6.6 10*3/uL (ref 4.0–10.5)
nRBC: 0 % (ref 0.0–0.2)

## 2020-01-04 LAB — URINALYSIS, MICROSCOPIC (REFLEX)

## 2020-01-04 LAB — LIPASE, BLOOD: Lipase: 36 U/L (ref 11–51)

## 2020-01-04 MED ORDER — SODIUM CHLORIDE 0.9 % IV BOLUS
1000.0000 mL | Freq: Once | INTRAVENOUS | Status: AC
  Administered 2020-01-04: 1000 mL via INTRAVENOUS

## 2020-01-04 MED ORDER — ONDANSETRON HCL 4 MG/2ML IJ SOLN
4.0000 mg | Freq: Once | INTRAMUSCULAR | Status: AC
  Administered 2020-01-04: 4 mg via INTRAVENOUS
  Filled 2020-01-04: qty 2

## 2020-01-04 MED ORDER — FENTANYL CITRATE (PF) 100 MCG/2ML IJ SOLN
50.0000 ug | Freq: Once | INTRAMUSCULAR | Status: DC
  Filled 2020-01-04: qty 2

## 2020-01-04 MED ORDER — ONDANSETRON HCL 4 MG PO TABS: 4.0000 mg | ORAL_TABLET | Freq: Four times a day (QID) | ORAL | 0 refills | Status: DC

## 2020-01-04 NOTE — ED Provider Notes (Signed)
[facility] DEPART[facility] Provider Note   CSN: [id number] Arrival date & time: 01/04/20  0139     History Chief Complaint  Patient presents with  . Abdominal Pain    [name] [name] is a 64 y.o. female.  The history is provided by the patient.  Abdominal Pain Pain location:  Epigastric Pain quality: aching   Pain radiates to:  Does not radiate Pain severity:  Mild Onset quality:  Gradual Timing:  Intermittent Progression:  Waxing and waning Context: not recent illness and not suspicious food intake   Relieved by:  Nothing Worsened by:  Nothing Associated symptoms: diarrhea   Associated symptoms: no chest pain, no chills, no cough, no dysuria, no fever, no hematuria, no shortness of breath, no sore throat and no vomiting   Risk factors: has not had multiple surgeries        Past Medical History:  Diagnosis Date  . Arthritis   . Cholelithiasis   . Hepatic steatosis   . Hyperlipemia   . Hypertension   . Hypothyroidism   . PONV (postoperative nausea and vomiting)     Patient Active Problem List   Diagnosis Date Noted  . Cough 12/25/2019  . Obese 12/26/2018  . Lumbar radiculopathy 12/26/2018  . Tremor of both hands 06/26/2018  . Vitamin D deficiency 02/06/2017  . Iron deficiency 02/06/2017  . Herniated cervical disc 06/17/2015  . Diabetes mellitus without complication ([place]) 26/20/3559  . Palpitations 06/17/2015  . Anxiety state 03/28/2015  . Nonspecific abnormal electrocardiogram (ECG) (EKG) 03/20/2014  . OSA (obstructive sleep apnea) 09/06/2012  . Allergic rhinoconjunctivitis, seasonal and perennial 07/02/2012  . History of colonic polyps 02/09/2010  . METABOLIC SYNDROME X 74/16/3845  . GERD 09/11/2008  . CHOLELITHIASIS 09/11/2008  . FATTY LIVER DISEASE, HX OF 09/11/2008  . Hypothyroidism 09/11/2007  . Hyperlipidemia 09/11/2007  . Essential hypertension 09/11/2007  . Memory loss 09/11/2007  . CARCINOMA, BASAL CELL, HX OF  05/09/2006    Past Surgical History:  Procedure Laterality Date  . ABDOMINAL HYSTERECTOMY     @ age 72, due to fibroids   . COLONOSCOPY W/ POLYPECTOMY  2010    Dr.Jacobs, due 2015   . DILATION AND CURETTAGE OF UTERUS    . MOHS SURGERY  2004   Basal Cell   . TONSILLECTOMY    . TOTAL KNEE ARTHROPLASTY  09/22/2011   Dr Nicholes Stairs, Left;     OB History   No obstetric history on file.     Family History  Problem Relation Age of Onset  . Diabetes Mother   . Leukemia Mother   . Alcohol abuse Father   . Lung cancer Father        smoker  . Diabetes Brother   . Heart attack Maternal Uncle        X3 ; MIs in 44s & 64s  . Stroke Maternal Uncle        in his 53s    Social History   Tobacco Use  . Smoking status: Never Smoker  . Smokeless tobacco: Never Used  Vaping Use  . Vaping Use: Never used  Substance Use Topics  . Alcohol use: Yes    Comment: rare  . Drug use: No    Home Medications Prior to Admission medications   Medication Sig Start Date End Date Taking? Authorizing Provider  albuterol (VENTOLIN HFA) 108 (90 Base) MCG/ACT inhaler Inhale 2 puffs into the lungs every 6 (six) hours as needed for wheezing or shortness  of breath. 08/19/19   Marrian Salvage, FNP  azithromycin [facility]) 250 MG tablet Day 1 take 2 pills, days 2-5 take 1 pill daily 12/25/19   Hoyt Koch, MD  benzonatate (TESSALON) 200 MG capsule Take 1 capsule (200 mg total) by mouth 3 (three) times daily as needed. 12/29/19   Hoyt Koch, MD  diclofenac (VOLTAREN) 75 MG EC tablet Take 1 tablet (75 mg total) by mouth 2 (two) times daily. 12/26/18   Binnie Rail, MD  fluticasone (FLONASE) 50 MCG/ACT nasal spray Use 2 sprays each nostril twice daily for 3 days, then once daily. 03/23/18   Posey Boyer, MD  gabapentin (NEURONTIN) 100 MG capsule TAKE 1-2 CAPSULES BY MOUTH EVERY NIGHT AT BEDTIME 12/17/19   Binnie Rail, MD  losartan (COZAAR) 100 MG tablet TAKE ONE TABLET BY MOUTH  DAILY Annual appt w/labs due in Dec must see provider for future refills 11/04/19   Binnie Rail, MD  meloxicam (MOBIC) 15 MG tablet Take 15 mg by mouth daily as needed. 10/29/18   [provider]  rosuvastatin (CRESTOR) 20 MG tablet TAKE ONE TABLET BY MOUTH DAILY Annual appt due in Dec must see provider for future refills 10/09/19   Binnie Rail, MD  sertraline (ZOLOFT) 100 MG tablet TAKE 1 TABLET BY MOUTH DAILY 12/17/19   Binnie Rail, MD  SYNTHROID 112 MCG tablet TAKE 1 TABLET BY MOUTH  DAILY 10/20/19   Binnie Rail, MD    Allergies    Ampicillin, Doxycycline, Oxycodone, and Penicillins  Review of Systems   Review of Systems  Constitutional: Negative for chills and fever.  HENT: Negative for ear pain and sore throat.   Eyes: Negative for pain and visual disturbance.  Respiratory: Negative for cough and shortness of breath.   Cardiovascular: Negative for chest pain and palpitations.  Gastrointestinal: Positive for abdominal pain and diarrhea. Negative for vomiting.  Genitourinary: Negative for dysuria and hematuria.  Musculoskeletal: Negative for arthralgias and back pain.  Skin: Negative for color change and rash.  Neurological: Negative for seizures and syncope.  All other systems reviewed and are negative.   Physical Exam Updated Vital Signs  ED Triage Vitals  Enc Vitals Group     BP 01/04/20 0202 (!) 149/83     Pulse Rate 01/04/20 0202 (!) 114     Resp 01/04/20 0202 18     Temp 01/04/20 0202 98.4 F (36.9 C)     Temp Source 01/04/20 0202 Oral     SpO2 01/04/20 0202 99 %     Weight 01/04/20 0203 235 lb (106.6 kg)     Height 01/04/20 0203 5\' 6"  (1.676 m)     Head Circumference --      Peak Flow --      Pain Score 01/04/20 0202 7     Pain Loc --      Pain Edu? --      Excl. in [place]? --     Physical Exam Vitals and nursing note reviewed.  Constitutional:      General: She is not in acute distress.    Appearance: She is well-developed and  well-nourished. She is not ill-appearing.  HENT:     Head: Normocephalic and atraumatic.     Mouth/Throat:     Mouth: Mucous membranes are moist.  Eyes:     Extraocular Movements: Extraocular movements intact.     Conjunctiva/sclera: Conjunctivae normal.  Cardiovascular:     Rate and Rhythm:  Normal rate and regular rhythm.     Heart sounds: Normal heart sounds. No murmur heard.   Pulmonary:     Effort: Pulmonary effort is normal. No respiratory distress.     Breath sounds: Normal breath sounds.  Abdominal:     General: Abdomen is flat.     Palpations: Abdomen is soft.     Tenderness: There is abdominal tenderness in the epigastric area. There is no right CVA tenderness, left CVA tenderness, guarding or rebound. Negative signs include Murphy's sign, Rovsing's sign, McBurney's sign and psoas sign.  Musculoskeletal:        General: No edema.     Cervical back: Neck supple.  Skin:    General: Skin is warm and dry.     Capillary Refill: Capillary refill takes less than 2 seconds.  Neurological:     General: No focal deficit present.     Mental Status: She is alert.  Psychiatric:        Mood and Affect: Mood and affect and mood normal.     ED Results / Procedures / Treatments   Labs (all labs ordered are listed, but only abnormal results are displayed) Labs Reviewed  CBC WITH DIFFERENTIAL/PLATELET - Abnormal; Notable for the following components:      Result Value   RBC 5.37 (*)    Hemoglobin 16.1 (*)    HCT 47.2 (*)    All other components within normal limits  COMPREHENSIVE METABOLIC PANEL - Abnormal; Notable for the following components:   Glucose, Bld 123 (*)    All other components within normal limits  URINALYSIS, ROUTINE W REFLEX MICROSCOPIC - Abnormal; Notable for the following components:   Hgb urine dipstick TRACE (*)    All other components within normal limits  URINALYSIS, MICROSCOPIC (REFLEX) - Abnormal; Notable for the following components:   Bacteria, UA  FEW (*)    All other components within normal limits  GASTROINTESTINAL PANEL BY PCR, STOOL (REPLACES STOOL CULTURE)  C DIFFICILE QUICK SCREEN W PCR REFLEX  LIPASE, BLOOD    EKG EKG Interpretation  Date/Time:  Sunday January 04 2020 01:52:15 EST Ventricular Rate:  113 PR Interval:  182 QRS Duration: 76 QT Interval:  318 QTC Calculation: 436 R Axis:   -79 Text Interpretation: Sinus tachycardia Left axis deviation Possible Lateral infarct , age undetermined Abnormal ECG Confirmed by Lennice Sites [phone]) on 01/04/2020 2:13:05 AM   Radiology No results found.  Procedures Procedures (including critical care time)  Medications Ordered in ED Medications  fentaNYL (SUBLIMAZE) injection 50 mcg (50 mcg Intravenous Not Given 01/04/20 0304)  sodium chloride 0.9 % bolus 1,000 mL (0 mLs Intravenous Stopped 01/04/20 0404)  ondansetron (ZOFRAN) injection 4 mg (4 mg Intravenous Given 01/04/20 0245)    ED Course  I have reviewed the triage vital signs and the nursing notes.  Pertinent labs & imaging results that were available during my care of the patient were reviewed by me and considered in my medical decision making (see chart for details).    MDM Rules/Calculators/A&P                          [name] [name] is a 64 year old female who presents to the ED with abdominal pain, diarrhea.  Patient with high cholesterol, hypertension history.  Symptoms for the last 4 days.  No obvious suspicious food intake.  Did finish a course of antibiotics last week.  However no fever.  No extreme foul-smelling  diarrhea.  Overall vital signs are normal.  She has some mild epigastric abdominal pain but no signs of peritonitis on exam.  No leukocytosis, no anemia, no electrolyte abnormalities.  Urinalysis negative for infection.  EKG shows sinus rhythm.  No chest pain.  Doubt cardiac process.  Overall suspect a viral GI process given diarrhea.  Possibly foodborne process.  Stool sample was  collected for C. difficile and other pathogens.  But no fever no white count have a low suspicion for C. difficile or any complications from C. difficile.  Suspect some mild colitis which is causing her pain.  Recommend continued supportive care.  Felt better after IV fluids and IV Zofran.  Given return precautions and discharged in ED in good condition.  No concern for hepatobiliary process given unremarkable enzymes.  Lipase is normal doubt pancreatitis.  This chart was dictated using voice recognition software.  Despite best efforts to proofread,  errors can occur which can change the documentation meaning.    Final Clinical Impression(s) / ED Diagnoses Final diagnoses:  Epigastric pain  Diarrhea, unspecified type    Rx / DC Orders ED Discharge Orders    None       Lennice Sites, DO 01/04/20 [phone]

## 2020-01-04 NOTE — ED Notes (Signed)
EDP at bedside  

## 2020-01-04 NOTE — ED Triage Notes (Signed)
Reports epigastric pain for the last four days with nausea and diarrhea.

## 2020-01-05 LAB — GASTROINTESTINAL PANEL BY PCR, STOOL (REPLACES STOOL CULTURE)

## 2020-01-13 ENCOUNTER — Other Ambulatory Visit: Payer: Self-pay | Admitting: Internal Medicine

## 2020-02-03 ENCOUNTER — Other Ambulatory Visit (INDEPENDENT_AMBULATORY_CARE_PROVIDER_SITE_OTHER): Payer: 59

## 2020-02-03 DIAGNOSIS — E039 Hypothyroidism, unspecified: Secondary | ICD-10-CM

## 2020-02-03 DIAGNOSIS — E119 Type 2 diabetes mellitus without complications: Secondary | ICD-10-CM

## 2020-02-03 DIAGNOSIS — E7849 Other hyperlipidemia: Secondary | ICD-10-CM | POA: Diagnosis not present

## 2020-02-03 LAB — HEMOGLOBIN A1C: Hgb A1c MFr Bld: 6.3 % (ref 4.6–6.5)

## 2020-02-03 LAB — CBC WITH DIFFERENTIAL/PLATELET
Basophils Absolute: 0 10*3/uL (ref 0.0–0.1)
Basophils Relative: 0.8 % (ref 0.0–3.0)
Eosinophils Absolute: 0.2 10*3/uL (ref 0.0–0.7)
Eosinophils Relative: 3.6 % (ref 0.0–5.0)
HCT: 45 % (ref 36.0–46.0)
Hemoglobin: 15.5 g/dL — ABNORMAL HIGH (ref 12.0–15.0)
Lymphocytes Relative: 27.1 % (ref 12.0–46.0)
Lymphs Abs: 1.7 10*3/uL (ref 0.7–4.0)
MCHC: 34.5 g/dL (ref 30.0–36.0)
MCV: 88 fl (ref 78.0–100.0)
Monocytes Absolute: 0.5 10*3/uL (ref 0.1–1.0)
Monocytes Relative: 7.8 % (ref 3.0–12.0)
Neutro Abs: 3.7 10*3/uL (ref 1.4–7.7)
Neutrophils Relative %: 60.7 % (ref 43.0–77.0)
Platelets: 297 10*3/uL (ref 150.0–400.0)
RBC: 5.11 Mil/uL (ref 3.87–5.11)
RDW: 13.8 % (ref 11.5–15.5)
WBC: 6.1 10*3/uL (ref 4.0–10.5)

## 2020-02-03 LAB — LIPID PANEL
Cholesterol: 153 mg/dL (ref 0–200)
HDL: 34.7 mg/dL — ABNORMAL LOW (ref >39.00)
NonHDL: 118.07
Total CHOL/HDL Ratio: 4
Triglycerides: 247 mg/dL — ABNORMAL HIGH (ref 0.0–149.0)
VLDL: 49.4 mg/dL — ABNORMAL HIGH (ref 0.0–40.0)

## 2020-02-03 LAB — COMPREHENSIVE METABOLIC PANEL
ALT: 25 U/L (ref 0–35)
AST: 18 U/L (ref 0–37)
Albumin: 4.3 g/dL (ref 3.5–5.2)
Alkaline Phosphatase: 54 U/L (ref 39–117)
BUN: 16 mg/dL (ref 6–23)
CO2: 24 mEq/L (ref 19–32)
Calcium: 9.3 mg/dL (ref 8.4–10.5)
Chloride: 104 mEq/L (ref 96–112)
Creatinine, Ser: 0.87 mg/dL (ref 0.40–1.20)
GFR: 70.24 mL/min (ref >60.00)
Glucose, Bld: 124 mg/dL — ABNORMAL HIGH (ref 70–99)
Potassium: 4.1 mEq/L (ref 3.5–5.1)
Sodium: 137 mEq/L (ref 135–145)
Total Bilirubin: 0.6 mg/dL (ref 0.2–1.2)
Total Protein: 6.9 g/dL (ref 6.0–8.3)

## 2020-02-03 LAB — LDL CHOLESTEROL, DIRECT: Direct LDL: 90 mg/dL

## 2020-02-03 LAB — TSH: TSH: 1.17 u[IU]/mL (ref 0.35–4.50)

## 2020-02-03 NOTE — Addendum Note (Signed)
Addended by: Trenda Moots on: 0/51/1021 07:39 AM   Modules accepted: Orders

## 2020-02-04 ENCOUNTER — Other Ambulatory Visit: Payer: Self-pay | Admitting: Internal Medicine

## 2020-03-30 NOTE — Progress Notes (Signed)
Subjective:    Patient ID: [name] [name], female    DOB: 02-23-1955, 65 y.o.   MRN: [id number]  HPI The patient is here for follow up of their chronic medical problems, including DM, htn, hyperlipidemia, hypothyroidism, anxiety  She had blood work done in January.   Having sweating - getting worse.  Her tremor is better with cutting down on the sertraline.  She is not taking 50 mg daily.  She would like to change this to something else because of the sweating is affecting her quality of life.  She is doing well with her other medications.  Medications and allergies reviewed with patient and updated if appropriate.  Patient Active Problem List   Diagnosis Date Noted  . Cough 12/25/2019  . Obese 12/26/2018  . Lumbar radiculopathy 12/26/2018  . Tremor of both hands 06/26/2018  . Vitamin D deficiency 02/06/2017  . Iron deficiency 02/06/2017  . Herniated cervical disc 06/17/2015  . Diabetes mellitus without complication ([place]) 50/35/4656  . Palpitations 06/17/2015  . Anxiety state 03/28/2015  . Nonspecific abnormal electrocardiogram (ECG) (EKG) 03/20/2014  . OSA (obstructive sleep apnea) 09/06/2012  . Allergic rhinoconjunctivitis, seasonal and perennial 07/02/2012  . History of colonic polyps 02/09/2010  . METABOLIC SYNDROME X 81/27/5170  . GERD 09/11/2008  . CHOLELITHIASIS 09/11/2008  . FATTY LIVER DISEASE, HX OF 09/11/2008  . Hypothyroidism 09/11/2007  . Hyperlipidemia 09/11/2007  . Essential hypertension 09/11/2007  . Memory loss 09/11/2007  . CARCINOMA, BASAL CELL, HX OF 05/09/2006    Current Outpatient Medications on File Prior to Visit  Medication Sig Dispense Refill  . diclofenac (VOLTAREN) 75 MG EC tablet Take 1 tablet (75 mg total) by mouth 2 (two) times daily. 60 tablet 3  . losartan (COZAAR) 100 MG tablet TAKE ONE TABLET BY MOUTH DAILY 90 tablet 1  . rosuvastatin (CRESTOR) 20 MG tablet TAKE ONE TABLET BY MOUTH DAILY MUST SEE PROVIDER AT DECEMBER  APPOINTMENT FOR FURTHER REFILLS 90 tablet 0  . SYNTHROID 112 MCG tablet TAKE 1 TABLET BY MOUTH  DAILY 90 tablet 1   No current facility-administered medications on file prior to visit.    Past Medical History:  Diagnosis Date  . Arthritis   . Cholelithiasis   . Hepatic steatosis   . Hyperlipemia   . Hypertension   . Hypothyroidism   . PONV (postoperative nausea and vomiting)     Past Surgical History:  Procedure Laterality Date  . ABDOMINAL HYSTERECTOMY     @ age 4, due to fibroids   . COLONOSCOPY W/ POLYPECTOMY  2010    Dr.Jacobs, due 2015   . DILATION AND CURETTAGE OF UTERUS    . MOHS SURGERY  2004   Basal Cell   . TONSILLECTOMY    . TOTAL KNEE ARTHROPLASTY  09/22/2011   Dr Nicholes Stairs, Left;    Social History   Socioeconomic History  . Marital status: Married    Spouse name: Not on file  . Number of children: Not on file  . Years of education: Not on file  . Highest education level: Not on file  Occupational History  . Not on file  Tobacco Use  . Smoking status: Never Smoker  . Smokeless tobacco: Never Used  Vaping Use  . Vaping Use: Never used  Substance and Sexual Activity  . Alcohol use: Yes    Comment: rare  . Drug use: No  . Sexual activity: Not on file  Other Topics Concern  . Not on file  Social History Narrative   Not exercising regularly   Social Determinants of Health   Financial Resource Strain: Not on file  Food Insecurity: Not on file  Transportation Needs: Not on file  Physical Activity: Not on file  Stress: Not on file  Social Connections: Not on file    Family History  Problem Relation Age of Onset  . Diabetes Mother   . Leukemia Mother   . Alcohol abuse Father   . Lung cancer Father        smoker  . Diabetes Brother   . Heart attack Maternal Uncle        X3 ; MIs in 31s & 9s  . Stroke Maternal Uncle        in his 8s    Review of Systems  Constitutional: Positive for diaphoresis.  Neurological: Positive for tremors.   Psychiatric/Behavioral: Negative for dysphoric mood. The patient is nervous/anxious.        Objective:   Vitals:   03/31/20 1513  BP: 130/76  Pulse: 98  Temp: 98.4 F (36.9 C)  SpO2: 98%   BP Readings from Last 3 Encounters:  03/31/20 130/76  01/04/20 (!) 131/54  10/20/19 126/70   Wt Readings from Last 3 Encounters:  03/31/20 235 lb (106.6 kg)  01/04/20 235 lb (106.6 kg)  10/20/19 237 lb (107.5 kg)   Body mass index is 37.93 kg/m.   Physical Exam    Constitutional: Appears well-developed and well-nourished. No distress.  HENT:  Head: Normocephalic and atraumatic.  Neck: Neck supple. No tracheal deviation present. No thyromegaly present.  No cervical lymphadenopathy Cardiovascular: Normal rate, regular rhythm and normal heart sounds.   No murmur heard. No carotid bruit .  No edema Pulmonary/Chest: Effort normal and breath sounds normal. No respiratory distress. No has no wheezes. No rales.  Skin: Skin is warm and dry. Not diaphoretic.  Psychiatric: Normal mood and affect. Behavior is normal.    Lab Results  Component Value Date   WBC 6.1 02/03/2020   HGB 15.5 (H) 02/03/2020   HCT 45.0 02/03/2020   PLT 297.0 02/03/2020   GLUCOSE 124 (H) 02/03/2020   CHOL 153 02/03/2020   TRIG 247.0 (H) 02/03/2020   HDL 34.70 (L) 02/03/2020   LDLDIRECT 90.0 02/03/2020   LDLCALC 63 12/26/2018   ALT 25 02/03/2020   AST 18 02/03/2020   NA 137 02/03/2020   K 4.1 02/03/2020   CL 104 02/03/2020   CREATININE 0.87 02/03/2020   BUN 16 02/03/2020   CO2 24 02/03/2020   TSH 1.17 02/03/2020   INR 1.00 09/19/2011   HGBA1C 6.3 02/03/2020   MICROALBUR 1.0 02/27/2006     Assessment & Plan:    See Problem List for Assessment and Plan of chronic medical problems.    This visit occurred during the SARS-CoV-2 public health emergency.  Safety protocols were in place, including screening questions prior to the visit, additional usage of staff PPE, and extensive cleaning of exam room  while observing appropriate contact time as indicated for disinfecting solutions.

## 2020-03-30 NOTE — Patient Instructions (Addendum)
    Medications changes include :   Fluvoxamine 100 mg at bedtime.   Stop the sertraline.    Your prescription(s) have been submitted to your pharmacy. Please take as directed and contact our office if you believe you are having problem(s) with the medication(s).     Please followup in 6 months

## 2020-03-31 ENCOUNTER — Encounter: Payer: Self-pay | Admitting: Internal Medicine

## 2020-03-31 ENCOUNTER — Other Ambulatory Visit: Payer: Self-pay

## 2020-03-31 ENCOUNTER — Ambulatory Visit: Payer: 59 | Admitting: Internal Medicine

## 2020-03-31 VITALS — BP 130/76 | HR 98 | Temp 98.4°F | Ht 66.0 in | Wt 235.0 lb

## 2020-03-31 DIAGNOSIS — E039 Hypothyroidism, unspecified: Secondary | ICD-10-CM | POA: Diagnosis not present

## 2020-03-31 DIAGNOSIS — F411 Generalized anxiety disorder: Secondary | ICD-10-CM

## 2020-03-31 DIAGNOSIS — I10 Essential (primary) hypertension: Secondary | ICD-10-CM

## 2020-03-31 DIAGNOSIS — E119 Type 2 diabetes mellitus without complications: Secondary | ICD-10-CM

## 2020-03-31 MED ORDER — FLUVOXAMINE MALEATE 100 MG PO TABS: 100.0000 mg | ORAL_TABLET | Freq: Every day | ORAL | 5 refills | Status: DC

## 2020-03-31 MED ORDER — GABAPENTIN 100 MG PO CAPS: 100.0000 mg | ORAL_CAPSULE | Freq: Three times a day (TID) | ORAL | 1 refills | Status: DC

## 2020-03-31 NOTE — Assessment & Plan Note (Signed)
Chronic Blood pressure well controlled Continue losartan 100 mg daily Recent blood work reviewed

## 2020-03-31 NOTE — Assessment & Plan Note (Signed)
Chronic Has benefited from sertraline, but this is causing tremors and diaphoresis-she decreased her dose to 50 mg daily and we will discontinue that now Start fluvoxamine 100 mg at bedtime-this has less of a risk of hyperhidrosis She will update me via MyChart

## 2020-03-31 NOTE — Assessment & Plan Note (Signed)
Chronic Recent TSH within normal limits Continue Synthroid 112 mcg daily

## 2020-03-31 NOTE — Assessment & Plan Note (Signed)
Chronic Most recent sugars in prediabetic range-A1c 6.3%

## 2020-04-07 ENCOUNTER — Other Ambulatory Visit: Payer: Self-pay | Admitting: Internal Medicine

## 2020-04-15 ENCOUNTER — Other Ambulatory Visit: Payer: Self-pay | Admitting: Internal Medicine

## 2020-04-29 ENCOUNTER — Telehealth: Payer: 59 | Admitting: Family Medicine

## 2020-04-29 DIAGNOSIS — R059 Cough, unspecified: Secondary | ICD-10-CM | POA: Diagnosis not present

## 2020-04-29 DIAGNOSIS — R0981 Nasal congestion: Secondary | ICD-10-CM | POA: Diagnosis not present

## 2020-04-29 DIAGNOSIS — M48061 Spinal stenosis, lumbar region without neurogenic claudication: Secondary | ICD-10-CM | POA: Diagnosis not present

## 2020-04-29 MED ORDER — FLUTICASONE PROPIONATE 50 MCG/ACT NA SUSP: 2.0000 | Freq: Every day | NASAL | 1 refills | Status: DC

## 2020-04-29 MED ORDER — BENZONATATE 100 MG PO CAPS: 100.0000 mg | ORAL_CAPSULE | Freq: Three times a day (TID) | ORAL | 0 refills | Status: DC | PRN

## 2020-04-29 NOTE — Progress Notes (Signed)
Virtual Visit via Video Note  I connected with [name] [name]  on 04/29/20 at 10:20 AM EDT by a video enabled telemedicine application and verified that I am speaking with the correct person using two identifiers.  Location patient: home, [place] Location provider:work or home office Persons participating in the virtual visit: patient, provider  I discussed the limitations of evaluation and management by telemedicine and the availability of in person appointments. The patient expressed understanding and agreed to proceed.   HPI:  Acute telemedicine visit for cough and congestion: -Onset: 4-5 days ago -1 home covid test was negative -Symptoms include: nasal congestion, sinus discomfort, cough, pnd, sneezing, itchy eye -Denies: fever, NVD, CP, SOB, body aches, inability to eat/drink/get out of bed, sick contacts -Has tried: cold and flu medications over the counter -Pertinent past medical history: seasonal allergies -Pertinent medication allergies: penicillins, doxy, oxy, amp -COVID-19 vaccine status: fully vaccinated and boosted  ROS: See pertinent positives and negatives per HPI.  Past Medical History:  Diagnosis Date  . Arthritis   . Cholelithiasis   . Hepatic steatosis   . Hyperlipemia   . Hypertension   . Hypothyroidism   . PONV (postoperative nausea and vomiting)     Past Surgical History:  Procedure Laterality Date  . ABDOMINAL HYSTERECTOMY     @ age 89, due to fibroids   . COLONOSCOPY W/ POLYPECTOMY  2010    Dr.Jacobs, due 2015   . DILATION AND CURETTAGE OF UTERUS    . MOHS SURGERY  2004   Basal Cell   . TONSILLECTOMY    . TOTAL KNEE ARTHROPLASTY  09/22/2011   Dr Nicholes Stairs, Left;     Current Outpatient Medications:  .  benzonatate (TESSALON PERLES) 100 MG capsule, Take 1 capsule (100 mg total) by mouth 3 (three) times daily as needed., Disp: 20 capsule, Rfl: 0 .  fluticasone (FLONASE) 50 MCG/ACT nasal spray, Place 2 sprays into both nostrils daily., Disp: 16 g, Rfl: 1 .   diclofenac (VOLTAREN) 75 MG EC tablet, TAKE ONE TABLET BY MOUTH TWICE A DAY, Disp: 60 tablet, Rfl: 3 .  fluvoxaMINE (LUVOX) 100 MG tablet, Take 1 tablet (100 mg total) by mouth at bedtime., Disp: 30 tablet, Rfl: 5 .  gabapentin (NEURONTIN) 100 MG capsule, Take 1 capsule (100 mg total) by mouth 3 (three) times daily., Disp: 270 capsule, Rfl: 1 .  losartan (COZAAR) 100 MG tablet, TAKE ONE TABLET BY MOUTH DAILY, Disp: 90 tablet, Rfl: 1 .  rosuvastatin (CRESTOR) 20 MG tablet, TAKE ONE TABLET BY MOUTH DAILY  *MUST SEE PROVIDER AT DECEMBER APPOINTMENT FOR FURTHER REFILLS, Disp: 90 tablet, Rfl: 0 .  SYNTHROID 112 MCG tablet, TAKE 1 TABLET BY MOUTH  DAILY, Disp: 90 tablet, Rfl: 1  EXAM:  VITALS per patient if applicable:  GENERAL: alert, oriented, appears well and in no acute distress  HEENT: atraumatic, conjunttiva clear, no obvious abnormalities on inspection of external nose and ears  NECK: normal movements of the head and neck  LUNGS: on inspection no signs of respiratory distress, breathing rate appears normal, no obvious gross SOB, gasping or wheezing  CV: no obvious cyanosis  MS: moves all visible extremities without noticeable abnormality  PSYCH/NEURO: pleasant and cooperative, no obvious depression or anxiety, speech and thought processing grossly intact  ASSESSMENT AND PLAN:  Discussed the following assessment and plan:  Nasal congestion  Cough  -we discussed possible serious and likely etiologies, options for evaluation and workup, limitations of telemedicine visit vs in person visit, treatment, treatment  risks and precautions. Pt prefers to treat via telemedicine empirically rather than in person at this moment. Query Allergic rhinitis, possible VURI vs other. Opted for zyrtec, flonase and tessalon for cough. She agrees to do the second covid test at home. Potential complications and precautions discussed. Work/School slipped offered:   declined Scheduled follow up with PCP  offered: she agrees to schedule follow up if needed through her primary care office.  Advised to seek prompt in person care if worsening, new symptoms arise, or if is not improving with treatment. Discussed options for inperson care if PCP office not available. Did let this patient know that I only do telemedicine on Tuesdays and Thursdays for [facility]. Advised to schedule follow up visit with PCP or [facility] if any further questions or concerns to avoid delays in care.   I discussed the assessment and treatment plan with the patient. The patient was provided an opportunity to ask questions and all were answered. The patient agreed with the plan and demonstrated an understanding of the instructions.     Lucretia Kern, DO

## 2020-04-29 NOTE — Patient Instructions (Signed)
-  I sent the medication(s) we discussed to your pharmacy: Meds ordered this encounter  Medications  . fluticasone (FLONASE) 50 MCG/ACT nasal spray    Sig: Place 2 sprays into both nostrils daily.    Dispense:  16 g    Refill:  1  . benzonatate (TESSALON PERLES) 100 MG capsule    Sig: Take 1 capsule (100 mg total) by mouth 3 (three) times daily as needed.    Dispense:  20 capsule    Refill:  0   Continue the zyrtec daily.  Nasal saline twice daily can sometimes help as well.   Drink plenty of fluids and avoid dairy.  Do a 2nd covid test today.  I hope you are feeling better soon!  Seek in person care promptly if your symptoms worsen, new concerns arise or you are not improving with treatment.  It was nice to meet you today. I help [facility] out with telemedicine visits on Tuesdays and Thursdays and am available for visits on those days. If you have any concerns or questions following this visit please schedule a follow up visit with your Primary Care doctor or seek care at a local urgent care clinic to avoid delays in care.

## 2020-05-04 ENCOUNTER — Telehealth: Payer: Self-pay | Admitting: Internal Medicine

## 2020-05-04 NOTE — Telephone Encounter (Signed)
Patient called and said that she is not getting any better. She had a virtual on 4-7 with Dr. Maudie Mercury. She said that she gave her a cough pill and it does not seem to be helping. She can be reached at [phone]. Please advise

## 2020-05-04 NOTE — Progress Notes (Signed)
Subjective:    Patient ID: [name] [name], female    DOB: 1955/11/11, 65 y.o.   MRN: [id number]  HPI The patient is here for an acute visit.  She is here for an acute visit for cold symptoms.   Her symptoms started off as allergies.  She started taking zrytec and it did not help.  She started vicks cold and flu.  Her symptoms are getting worse.  She had an evisit with Dr Maudie Mercury and was prescribed cough pills and nasal spray.  Helped a little bit, but her symptoms continue to worsen.  She is coughing nonstop and is bringing up some sputum.  She is nasal congestion and her nasal mucus is discolored.  She states sinus pain and pressure, postnasal drip, right ear pain and a clogged feeling, occasional wheeze and headaches.  She has not had any fevers.  She did take two covid tests that were negative.       Medications and allergies reviewed with patient and updated if appropriate.  Patient Active Problem List   Diagnosis Date Noted  . Cough 12/25/2019  . Obese 12/26/2018  . Lumbar radiculopathy 12/26/2018  . Tremor of both hands 06/26/2018  . Vitamin D deficiency 02/06/2017  . Iron deficiency 02/06/2017  . Herniated cervical disc 06/17/2015  . Diabetes mellitus without complication ([place]) 79/02/4095  . Palpitations 06/17/2015  . Anxiety state 03/28/2015  . Nonspecific abnormal electrocardiogram (ECG) (EKG) 03/20/2014  . OSA (obstructive sleep apnea) 09/06/2012  . Allergic rhinoconjunctivitis, seasonal and perennial 07/02/2012  . History of colonic polyps 02/09/2010  . METABOLIC SYNDROME X 35/32/9924  . GERD 09/11/2008  . CHOLELITHIASIS 09/11/2008  . FATTY LIVER DISEASE, HX OF 09/11/2008  . Hypothyroidism 09/11/2007  . Hyperlipidemia 09/11/2007  . Essential hypertension 09/11/2007  . Memory loss 09/11/2007  . CARCINOMA, BASAL CELL, HX OF 05/09/2006    Current Outpatient Medications on File Prior to Visit  Medication Sig Dispense Refill  . benzonatate (TESSALON PERLES)  100 MG capsule Take 1 capsule (100 mg total) by mouth 3 (three) times daily as needed. 20 capsule 0  . diclofenac (VOLTAREN) 75 MG EC tablet TAKE ONE TABLET BY MOUTH TWICE A DAY 60 tablet 3  . finasteride (PROSCAR) 5 MG tablet Take by mouth.    . fluticasone (FLONASE) 50 MCG/ACT nasal spray Place 2 sprays into both nostrils daily. 16 g 1  . fluvoxaMINE (LUVOX) 100 MG tablet Take 1 tablet (100 mg total) by mouth at bedtime. 30 tablet 5  . gabapentin (NEURONTIN) 100 MG capsule Take 1 capsule (100 mg total) by mouth 3 (three) times daily. 270 capsule 1  . losartan (COZAAR) 100 MG tablet TAKE ONE TABLET BY MOUTH DAILY 90 tablet 1  . rosuvastatin (CRESTOR) 20 MG tablet TAKE ONE TABLET BY MOUTH DAILY  *MUST SEE PROVIDER AT DECEMBER APPOINTMENT FOR FURTHER REFILLS 90 tablet 0  . SYNTHROID 112 MCG tablet TAKE 1 TABLET BY MOUTH  DAILY 90 tablet 1   No current facility-administered medications on file prior to visit.    Past Medical History:  Diagnosis Date  . Arthritis   . Cholelithiasis   . Hepatic steatosis   . Hyperlipemia   . Hypertension   . Hypothyroidism   . PONV (postoperative nausea and vomiting)     Past Surgical History:  Procedure Laterality Date  . ABDOMINAL HYSTERECTOMY     @ age 34, due to fibroids   . COLONOSCOPY W/ POLYPECTOMY  2010    Dr.Jacobs, due  2015   . DILATION AND CURETTAGE OF UTERUS    . MOHS SURGERY  2004   Basal Cell   . TONSILLECTOMY    . TOTAL KNEE ARTHROPLASTY  09/22/2011   Dr Nicholes Stairs, Left;    Social History   Socioeconomic History  . Marital status: Married    Spouse name: Not on file  . Number of children: Not on file  . Years of education: Not on file  . Highest education level: Not on file  Occupational History  . Not on file  Tobacco Use  . Smoking status: Never Smoker  . Smokeless tobacco: Never Used  Vaping Use  . Vaping Use: Never used  Substance and Sexual Activity  . Alcohol use: Yes    Comment: rare  . Drug use: No  . Sexual  activity: Not on file  Other Topics Concern  . Not on file  Social History Narrative   Not exercising regularly   Social Determinants of Health   Financial Resource Strain: Not on file  Food Insecurity: Not on file  Transportation Needs: Not on file  Physical Activity: Not on file  Stress: Not on file  Social Connections: Not on file    Family History  Problem Relation Age of Onset  . Diabetes Mother   . Leukemia Mother   . Alcohol abuse Father   . Lung cancer Father        smoker  . Diabetes Brother   . Heart attack Maternal Uncle        X3 ; MIs in 38s & 59s  . Stroke Maternal Uncle        in his 40s    Review of Systems  Constitutional: Negative for fever.  HENT: Positive for ear pain (right, blocked and pain), postnasal drip, sinus pressure and sinus pain. Negative for sore throat. Congestion: green mucus.   Respiratory: Positive for cough (productive at times) and wheezing (mild). Negative for chest tightness and shortness of breath.   Gastrointestinal: Negative for diarrhea and nausea.  Musculoskeletal: Negative for myalgias.  Neurological: Positive for headaches. Negative for dizziness.       Objective:   Vitals:   05/05/20 0839  BP: (!) 142/70  Pulse: 97  Temp: 98.7 F (37.1 C)  SpO2: 99%   BP Readings from Last 3 Encounters:  05/05/20 (!) 142/70  03/31/20 130/76  01/04/20 (!) 131/54   Wt Readings from Last 3 Encounters:  05/05/20 230 lb (104.3 kg)  03/31/20 235 lb (106.6 kg)  01/04/20 235 lb (106.6 kg)   Body mass index is 37.12 kg/m.   Physical Exam    GENERAL APPEARANCE: Appears stated age, well appearing, NAD EYES: conjunctiva clear, no icterus HENT: bilateral tympanic membranes and ear canals normal, oropharynx with no erythema or exudates, trachea midline, no cervical or supraclavicular lymphadenopathy LUNGS: Unlabored breathing, good air entry bilaterally, clear to auscultation without wheeze or crackles CARDIOVASCULAR: Normal S1,S2  , no edema SKIN: Warm, dry      Assessment & Plan:    See Problem List for Assessment and Plan of chronic medical problems.    This visit occurred during the SARS-CoV-2 public health emergency.  Safety protocols were in place, including screening questions prior to the visit, additional usage of staff PPE, and extensive cleaning of exam room while observing appropriate contact time as indicated for disinfecting solutions.

## 2020-05-05 ENCOUNTER — Ambulatory Visit: Payer: 59 | Admitting: Internal Medicine

## 2020-05-05 ENCOUNTER — Other Ambulatory Visit: Payer: Self-pay

## 2020-05-05 ENCOUNTER — Encounter: Payer: Self-pay | Admitting: Internal Medicine

## 2020-05-05 DIAGNOSIS — J0100 Acute maxillary sinusitis, unspecified: Secondary | ICD-10-CM

## 2020-05-05 MED ORDER — HYDROCOD POLST-CPM POLST ER 10-8 MG/5ML PO SUER: 5.0000 mL | Freq: Two times a day (BID) | ORAL | 0 refills | Status: DC | PRN

## 2020-05-05 MED ORDER — SULFAMETHOXAZOLE-TRIMETHOPRIM 800-160 MG PO TABS: 1.0000 | ORAL_TABLET | Freq: Two times a day (BID) | ORAL | 0 refills | Status: DC

## 2020-05-05 NOTE — Assessment & Plan Note (Signed)
Acute Likely bacterial  Start Bactrim DS 800-160 mg twice daily x 10 day Tussionex cough syrup every 12 hours as needed Can use the cough pills during the day if needed Flonase otc cold medications Rest, fluid Call if no improvement

## 2020-05-05 NOTE — Patient Instructions (Signed)
Take the antibiotic as prescribed - complete the entire course.  Use the cough syrup as needed.  Continue over the counter cold medication, advil and tylenol.  Increase your fluids and rest.    Call if no improvement    

## 2020-05-17 ENCOUNTER — Encounter: Payer: Self-pay | Admitting: Internal Medicine

## 2020-05-18 MED ORDER — CLONAZEPAM 0.5 MG PO TABS: 0.5000 mg | ORAL_TABLET | Freq: Two times a day (BID) | ORAL | 2 refills | Status: DC | PRN

## 2020-05-18 NOTE — Addendum Note (Signed)
Addended by: Binnie Rail on: 05/18/2020 01:05 PM   Modules accepted: Orders

## 2020-05-21 DIAGNOSIS — H04123 Dry eye syndrome of bilateral lacrimal glands: Secondary | ICD-10-CM | POA: Diagnosis not present

## 2020-06-14 ENCOUNTER — Telehealth: Payer: Self-pay | Admitting: Internal Medicine

## 2020-06-14 ENCOUNTER — Other Ambulatory Visit: Payer: Self-pay

## 2020-06-14 MED ORDER — LOSARTAN POTASSIUM 100 MG PO TABS: ORAL_TABLET | ORAL | 1 refills | Status: DC

## 2020-06-14 NOTE — Telephone Encounter (Signed)
1.Medication Requested: rosuvastatin (CRESTOR) 20 MG tablet    2. Pharmacy (Name, [facility]): [facility], [address]  3. On Med List: yes   4. Last Visit with PCP: 05-05-20  5. Next visit date with PCP: 10-01-20  Patient is requesting a 90 day supply    Agent: Please be advised that RX refills may take up to 3 business days. We ask that you follow-up with your pharmacy.

## 2020-06-14 NOTE — Telephone Encounter (Signed)
Sent in today 

## 2020-06-17 DIAGNOSIS — Z1231 Encounter for screening mammogram for malignant neoplasm of breast: Secondary | ICD-10-CM | POA: Diagnosis not present

## 2020-06-17 DIAGNOSIS — Z7689 Persons encountering health services in other specified circumstances: Secondary | ICD-10-CM | POA: Diagnosis not present

## 2020-06-17 DIAGNOSIS — R638 Other symptoms and signs concerning food and fluid intake: Secondary | ICD-10-CM | POA: Insufficient documentation

## 2020-06-17 DIAGNOSIS — N951 Menopausal and female climacteric states: Secondary | ICD-10-CM | POA: Insufficient documentation

## 2020-06-17 HISTORY — DX: Menopausal and female climacteric states: N95.1

## 2020-06-23 ENCOUNTER — Other Ambulatory Visit: Payer: Self-pay | Admitting: Obstetrics and Gynecology

## 2020-06-23 DIAGNOSIS — E2839 Other primary ovarian failure: Secondary | ICD-10-CM

## 2020-07-12 ENCOUNTER — Other Ambulatory Visit: Payer: Self-pay | Admitting: Internal Medicine

## 2020-07-20 NOTE — Patient Instructions (Addendum)
     Medications changes include :   oxybutynin 5 mg daily.  Restart sertraline.   Your prescription(s) have been submitted to your pharmacy. Please take as directed and contact our office if you believe you are having problem(s) with the medication(s).   A referral was ordered for endocrine.       Someone from their office will call you to schedule an appointment.    Please followup in September as scheduled

## 2020-07-20 NOTE — Progress Notes (Signed)
Subjective:    Patient ID: [name] [name], female    DOB: 1955/07/05, 65 y.o.   MRN: [id number]  HPI The patient is here for follow up of their chronic medical problems, including htn  BP elevated at home - it was high - it was secondary to the effexor, which was also causing constipation.  Since stopping the medication her blood pressure adequately controlled.   She has back pain.  This limits her.   She is getting an injection in a couple of weeks with murphy wainer.      She continues to have excessive sweating for no reason.  Anytime she gets up to do something she sweats.  Any movement causes sweating - just walking slowly.  Every once in a while she will break out in a sweat when she is sitting and often in the middle of the night she will sweat.  She denies palps, sob, fatigue.   She she sweats it is mostly her head up but always from her waist up.  She has always been hot natured.  This has been going on for years and it has gotten worse.   Nocturia at least twice.  During day no excessive urination.  No incontinence.   Medications and allergies reviewed with patient and updated if appropriate.  Patient Active Problem List   Diagnosis Date Noted   Cough 12/25/2019   Obese 12/26/2018   Lumbar radiculopathy 12/26/2018   Tremor of both hands 06/26/2018   Vitamin D deficiency 02/06/2017   Iron deficiency 02/06/2017   Acute sinus infection 03/16/2016   Herniated cervical disc 06/17/2015   Diabetes mellitus without complication ([place]) 66/29/4765   Palpitations 06/17/2015   Anxiety state 03/28/2015   Nonspecific abnormal electrocardiogram (ECG) (EKG) 03/20/2014   OSA (obstructive sleep apnea) 09/06/2012   Allergic rhinoconjunctivitis, seasonal and perennial 07/02/2012   History of colonic polyps 46/50/3546   METABOLIC SYNDROME X 56/81/2751   GERD 09/11/2008   CHOLELITHIASIS 09/11/2008   FATTY LIVER DISEASE, HX OF 09/11/2008   Hypothyroidism 09/11/2007    Hyperlipidemia 09/11/2007   Essential hypertension 09/11/2007   Memory loss 09/11/2007   CARCINOMA, BASAL CELL, HX OF 05/09/2006    Current Outpatient Medications on File Prior to Visit  Medication Sig Dispense Refill   venlafaxine (EFFEXOR) 75 MG tablet Take 75 mg by mouth 2 (two) times daily.     clonazePAM (KLONOPIN) 0.5 MG tablet Take 1 tablet (0.5 mg total) by mouth 2 (two) times daily as needed for anxiety. 60 tablet 2   diclofenac (VOLTAREN) 75 MG EC tablet TAKE ONE TABLET BY MOUTH TWICE A DAY 60 tablet 3   finasteride (PROSCAR) 5 MG tablet Take by mouth.     fluticasone (FLONASE) 50 MCG/ACT nasal spray Place 2 sprays into both nostrils daily. 16 g 1   gabapentin (NEURONTIN) 100 MG capsule Take 1 capsule (100 mg total) by mouth 3 (three) times daily. 270 capsule 1   losartan (COZAAR) 100 MG tablet TAKE ONE TABLET BY MOUTH DAILY 90 tablet 1   rosuvastatin (CRESTOR) 20 MG tablet TAKE ONE TABLET BY MOUTH DAILY  *MUST HAVE APPOINTMENT FOR FURTHER REFILLS* 90 tablet 0   SYNTHROID 112 MCG tablet TAKE 1 TABLET BY MOUTH  DAILY 90 tablet 1   No current facility-administered medications on file prior to visit.    Past Medical History:  Diagnosis Date   Arthritis    Cholelithiasis    Hepatic steatosis    Hyperlipemia  Hypertension    Hypothyroidism    PONV (postoperative nausea and vomiting)     Past Surgical History:  Procedure Laterality Date   ABDOMINAL HYSTERECTOMY     @ age 88, due to fibroids    COLONOSCOPY W/ POLYPECTOMY  2010    Dr.Jacobs, due 2015    DILATION AND CURETTAGE OF UTERUS     MOHS SURGERY  2004   Basal Cell    TONSILLECTOMY     TOTAL KNEE ARTHROPLASTY  09/22/2011   Dr Nicholes Stairs, Left;    Social History   Socioeconomic History   Marital status: Married    Spouse name: Not on file   Number of children: Not on file   Years of education: Not on file   Highest education level: Not on file  Occupational History   Not on file  Tobacco Use   Smoking  status: Never   Smokeless tobacco: Never  Vaping Use   Vaping Use: Never used  Substance and Sexual Activity   Alcohol use: Yes    Comment: rare   Drug use: No   Sexual activity: Not on file  Other Topics Concern   Not on file  Social History Narrative   Not exercising regularly   Social Determinants of Health   Financial Resource Strain: Not on file  Food Insecurity: Not on file  Transportation Needs: Not on file  Physical Activity: Not on file  Stress: Not on file  Social Connections: Not on file    Family History  Problem Relation Age of Onset   Diabetes Mother    Leukemia Mother    Alcohol abuse Father    Lung cancer Father        smoker   Diabetes Brother    Heart attack Maternal Uncle        X3 ; MIs in 77s & 55s   Stroke Maternal Uncle        in his 35s    Review of Systems  Constitutional:  Positive for diaphoresis. Negative for fever.  Eyes:  Negative for visual disturbance.  Respiratory:  Negative for cough, shortness of breath and wheezing.   Cardiovascular:  Negative for chest pain, palpitations and leg swelling.  Gastrointestinal:  Negative for abdominal pain, constipation, diarrhea and nausea.  Neurological:  Negative for dizziness, light-headedness and headaches.      Objective:   Vitals:   07/21/20 0811  BP: 132/78  Pulse: 92  Temp: 98.9 F (37.2 C)  SpO2: 97%   BP Readings from Last 3 Encounters:  07/21/20 132/78  05/05/20 (!) 142/70  03/31/20 130/76   Wt Readings from Last 3 Encounters:  07/21/20 235 lb (106.6 kg)  05/05/20 230 lb (104.3 kg)  03/31/20 235 lb (106.6 kg)   Body mass index is 37.93 kg/m.   Physical Exam    Constitutional: Appears well-developed and well-nourished. No distress.  HENT:  Head: Normocephalic and atraumatic.  Neck: Neck supple. No tracheal deviation present. No thyromegaly present.  No cervical lymphadenopathy Cardiovascular: Normal rate, regular rhythm and normal heart sounds.   No murmur heard.  No carotid bruit .  No edema Pulmonary/Chest: Effort normal and breath sounds normal. No respiratory distress. No has no wheezes. No rales.  Skin: Skin is warm and dry. Not diaphoretic.  Psychiatric: Normal mood and affect. Behavior is normal.      Assessment & Plan:    See Problem List for Assessment and Plan of chronic medical problems.    This visit occurred  during the SARS-CoV-2 public health emergency.  Safety protocols were in place, including screening questions prior to the visit, additional usage of staff PPE, and extensive cleaning of exam room while observing appropriate contact time as indicated for disinfecting solutions.

## 2020-07-21 ENCOUNTER — Encounter: Payer: Self-pay | Admitting: Internal Medicine

## 2020-07-21 ENCOUNTER — Other Ambulatory Visit: Payer: Self-pay

## 2020-07-21 ENCOUNTER — Ambulatory Visit (INDEPENDENT_AMBULATORY_CARE_PROVIDER_SITE_OTHER): Payer: Medicare Other | Admitting: Internal Medicine

## 2020-07-21 VITALS — BP 132/78 | HR 92 | Temp 98.9°F | Ht 66.0 in | Wt 235.0 lb

## 2020-07-21 DIAGNOSIS — R61 Generalized hyperhidrosis: Secondary | ICD-10-CM | POA: Diagnosis not present

## 2020-07-21 DIAGNOSIS — F411 Generalized anxiety disorder: Secondary | ICD-10-CM | POA: Diagnosis not present

## 2020-07-21 DIAGNOSIS — I10 Essential (primary) hypertension: Secondary | ICD-10-CM

## 2020-07-21 HISTORY — DX: Generalized hyperhidrosis: R61

## 2020-07-21 MED ORDER — OXYBUTYNIN CHLORIDE ER 5 MG PO TB24: 5.0000 mg | ORAL_TABLET | Freq: Every day | ORAL | 5 refills | Status: DC

## 2020-07-21 MED ORDER — SERTRALINE HCL 50 MG PO TABS
50.0000 mg | ORAL_TABLET | Freq: Every day | ORAL | 1 refills | Status: DC
Start: 2020-07-21 — End: 2021-07-03

## 2020-07-21 NOTE — Assessment & Plan Note (Signed)
Chronic Years of excessive sweating from waist up, mostly from head up.  Even walking in a store looking at clothing she will sweat excessively to the point that everyone is looking at her Any activity can cause sweating, occasionally will have it at rest.  Does experience it in the middle of the night Has gotten worse Did not tolerate gabapentin or Effexor-while on these things did not help Did not improve when she was taken off sertraline No associated symptoms ?  Primary hyperhidrosis versus hormonal Will get endocrines opinion to see if they think there is any hormonal or serious cause We will consider trying oxybutynin to see if that helps since this significantly impacts the quality of her life

## 2020-07-21 NOTE — Assessment & Plan Note (Signed)
Chronic BP well controlled now since stopping effexor Continue losartan 100 mg daily

## 2020-07-21 NOTE — Assessment & Plan Note (Addendum)
Chronic Restart sertraline 50 mg daily-this was mainly stopped because of her hyperhidrosis but there was no improvement with weaning off the medication Having increased anxiety so she will restart sertraline 50 mg daily

## 2020-07-27 DIAGNOSIS — M47816 Spondylosis without myelopathy or radiculopathy, lumbar region: Secondary | ICD-10-CM | POA: Diagnosis not present

## 2020-07-27 DIAGNOSIS — M48061 Spinal stenosis, lumbar region without neurogenic claudication: Secondary | ICD-10-CM | POA: Diagnosis not present

## 2020-08-02 ENCOUNTER — Other Ambulatory Visit: Payer: Self-pay | Admitting: Internal Medicine

## 2020-08-05 ENCOUNTER — Other Ambulatory Visit: Payer: 59

## 2020-09-01 DIAGNOSIS — M48061 Spinal stenosis, lumbar region without neurogenic claudication: Secondary | ICD-10-CM | POA: Diagnosis not present

## 2020-09-06 ENCOUNTER — Ambulatory Visit (INDEPENDENT_AMBULATORY_CARE_PROVIDER_SITE_OTHER): Payer: Medicare Other | Admitting: Endocrinology

## 2020-09-06 ENCOUNTER — Other Ambulatory Visit: Payer: Self-pay

## 2020-09-06 ENCOUNTER — Encounter: Payer: Self-pay | Admitting: Endocrinology

## 2020-09-06 VITALS — BP 142/90 | HR 85 | Ht 67.0 in | Wt 239.6 lb

## 2020-09-06 DIAGNOSIS — R61 Generalized hyperhidrosis: Secondary | ICD-10-CM

## 2020-09-06 NOTE — Patient Instructions (Signed)
Blood tests, and a 24-HR urine collection, are requested for you today.  We'll let you know about the results.

## 2020-09-06 NOTE — Progress Notes (Signed)
Subjective:    Patient ID: [name] [name], female    DOB: 03/30/1955, 65 y.o.   MRN: [id number]  HPI Pt is referred by Dr Quay Burow, for possible pheochromocytoma.  she reports many years of excessive sweating, palpitations, and flushing.  She gets this daily.  It happens with minimal exertion.  Ditropan helps somewhat.  she has h/o HTN.  she has no h/o adrenal disease, diabetes, migraine headache, MTC, alcohol withdrawal, neurofibromas, hemangiomas, brain aneurysm, cocaine use, and menopause.  she has never had adrenal imaging.   Past Medical History:  Diagnosis Date   Arthritis    Cholelithiasis    Hepatic steatosis    Hyperlipemia    Hypertension    Hypothyroidism    PONV (postoperative nausea and vomiting)     Past Surgical History:  Procedure Laterality Date   ABDOMINAL HYSTERECTOMY     @ age 52, due to fibroids    COLONOSCOPY W/ POLYPECTOMY  2010    Dr.Jacobs, due 2015    DILATION AND CURETTAGE OF UTERUS     MOHS SURGERY  2004   Basal Cell    TONSILLECTOMY     TOTAL KNEE ARTHROPLASTY  09/22/2011   Dr Nicholes Stairs, Left;    Social History   Socioeconomic History   Marital status: Married    Spouse name: Not on file   Number of children: Not on file   Years of education: Not on file   Highest education level: Not on file  Occupational History   Not on file  Tobacco Use   Smoking status: Never   Smokeless tobacco: Never  Vaping Use   Vaping Use: Never used  Substance and Sexual Activity   Alcohol use: Yes    Comment: rare   Drug use: No   Sexual activity: Not on file  Other Topics Concern   Not on file  Social History Narrative   Not exercising regularly   Social Determinants of Health   Financial Resource Strain: Not on file  Food Insecurity: Not on file  Transportation Needs: Not on file  Physical Activity: Not on file  Stress: Not on file  Social Connections: Not on file  Intimate Partner Violence: Not on file    Current Outpatient Medications on  File Prior to Visit  Medication Sig Dispense Refill   diclofenac (VOLTAREN) 75 MG EC tablet TAKE ONE TABLET BY MOUTH TWICE A DAY 60 tablet 3   finasteride (PROSCAR) 5 MG tablet Take by mouth.     fluticasone (FLONASE) 50 MCG/ACT nasal spray Place 2 sprays into both nostrils daily. 16 g 1   losartan (COZAAR) 100 MG tablet TAKE ONE TABLET BY MOUTH DAILY 90 tablet 1   oxybutynin (DITROPAN-XL) 5 MG 24 hr tablet Take 1 tablet (5 mg total) by mouth at bedtime. 30 tablet 5   rosuvastatin (CRESTOR) 20 MG tablet TAKE ONE TABLET BY MOUTH DAILY  *MUST HAVE APPOINTMENT FOR FURTHER REFILLS* 90 tablet 0   sertraline (ZOLOFT) 50 MG tablet Take 1 tablet (50 mg total) by mouth daily. 90 tablet 1   SYNTHROID 112 MCG tablet TAKE ONE TABLET BY MOUTH DAILY 90 tablet 1   clonazePAM (KLONOPIN) 0.5 MG tablet Take 1 tablet (0.5 mg total) by mouth 2 (two) times daily as needed for anxiety. 60 tablet 2   No current facility-administered medications on file prior to visit.    Allergies  Allergen Reactions   Ampicillin Rash    rash   Doxycycline Other (See Comments)  Joint symptoms w/o rash or fever   Effexor [Venlafaxine] Other (See Comments)    Constipation, elevated BP   Gabapentin Other (See Comments)    Made her "not there"   Oxycodone Nausea Only   Penicillins Rash    Family History  Problem Relation Age of Onset   Diabetes Mother    Leukemia Mother    Alcohol abuse Father    Lung cancer Father        smoker   Diabetes Brother    Heart attack Maternal Uncle        X3 ; MIs in 82s & 63s   Stroke Maternal Uncle        in his 47s   Adrenal disorder Neg Hx     BP (!) 142/90 (BP Location: Right Arm, Patient Position: Sitting, Cuff Size: Large)   Pulse 85   Ht '5\' 7"'$  (1.702 m)   Wt 239 lb 9.6 oz (108.7 kg)   SpO2 96%   BMI 37.53 kg/m   Review of Systems denies headache, pallor, n/v, syncope, diarrhea, weight loss, sob, and fever.  She has chronic anxiety.    Objective:   Physical  Exam VS: see vs page GEN: no distress HEAD: head: no deformity eyes: no periorbital swelling, no proptosis.   external nose and ears are normal NECK: supple, thyroid is not enlarged CHEST WALL: no deformity LUNGS: clear to auscultation CV: reg rate and rhythm, no murmur.  MUSCULOSKELETAL: gait is normal and steady EXTEMITIES: no deformity.  no leg edema NEURO:  readily moves all 4's.  sensation is intact to touch on all 4's.  No tremor. SKIN:  Normal texture and temperature.  No rash or suspicious lesion is visible.  Not diaphoretic now.   NODES:  None palpable at the neck PSYCH: alert, well-oriented.  Does not appear anxious nor depressed.  Lab Results  Component Value Date   TSH 1.17 02/03/2020   I have reviewed outside records, and summarized: Pt was noted to have sweating, and referred here.  Sertraline was resumed, because sweating had not stopped off it.     Assessment & Plan:  Excessive diaphoresis, new to me, uncertain etiology and prognosis.    Patient Instructions  Blood tests, and a 24-HR urine collection, are requested for you today.  We'll let you know about the results.

## 2020-09-09 ENCOUNTER — Other Ambulatory Visit: Payer: Self-pay | Admitting: Endocrinology

## 2020-09-09 ENCOUNTER — Other Ambulatory Visit: Payer: Medicare Other

## 2020-09-09 ENCOUNTER — Other Ambulatory Visit: Payer: Self-pay

## 2020-09-09 DIAGNOSIS — R61 Generalized hyperhidrosis: Secondary | ICD-10-CM | POA: Diagnosis not present

## 2020-09-11 LAB — CATECHOLAMINES, FRACTIONATED, PLASMA
Dopamine: <10 pg/mL
Epinephrine: 34 pg/mL
Norepinephrine: 471 pg/mL
Total Catecholamines: 505 pg/mL

## 2020-09-11 LAB — METANEPHRINES, PLASMA
Metanephrine, Free: <25 pg/mL (ref <=57)
Normetanephrine, Free: 42 pg/mL (ref <=148)
Total Metanephrines-Plasma: 42 pg/mL (ref <=205)

## 2020-09-13 DIAGNOSIS — M5416 Radiculopathy, lumbar region: Secondary | ICD-10-CM | POA: Diagnosis not present

## 2020-09-14 LAB — 5 HIAA, QUANTITATIVE, URINE, 24 HOUR
5 HIAA, 24 Hour Urine: 3.7 mg/24 h (ref <=6.0)
Total Volume: 1600 mL

## 2020-09-14 LAB — CATECHOLAMINES, FRACTIONATED, URINE, 24 HOUR
Calc Total (E+NE): 41 mcg/24 h (ref 26–121)
Creatinine, Urine mg/day-CATEUR: 0.97 g/(24.h) (ref 0.50–2.15)
Dopamine 24 Hr Urine: 140 mcg/24 h (ref 52–480)
Epinephrine, 24H, Ur: 8 mcg/24 h (ref 2–24)
Norepinephrine, 24H, Ur: 33 mcg/24 h (ref 15–100)
Total Volume: 1600 mL

## 2020-09-14 LAB — METANEPHRINES, URINE, 24 HOUR
METANEPHRINE: 40 mcg/24 h — ABNORMAL LOW (ref 90–315)
METANEPHRINES, TOTAL: 190 mcg/24 h — ABNORMAL LOW (ref 224–832)
NORMETANEPHRINE: 150 mcg/24 h (ref 122–676)
Total Volume: 1600 mL

## 2020-09-21 ENCOUNTER — Ambulatory Visit
Admission: RE | Admit: 2020-09-21 | Discharge: 2020-09-21 | Disposition: A | Discharge status: Home / Self Care | Payer: Medicare Other | Source: Ambulatory Visit | Attending: Obstetrics and Gynecology | Admitting: Obstetrics and Gynecology

## 2020-09-21 ENCOUNTER — Other Ambulatory Visit: Payer: Self-pay

## 2020-09-21 ENCOUNTER — Other Ambulatory Visit: Payer: Medicare Other

## 2020-09-21 DIAGNOSIS — E2839 Other primary ovarian failure: Secondary | ICD-10-CM

## 2020-09-21 DIAGNOSIS — Z780 Asymptomatic menopausal state: Secondary | ICD-10-CM | POA: Diagnosis not present

## 2020-09-28 ENCOUNTER — Telehealth: Payer: Self-pay

## 2020-09-28 DIAGNOSIS — E119 Type 2 diabetes mellitus without complications: Secondary | ICD-10-CM

## 2020-09-28 DIAGNOSIS — E039 Hypothyroidism, unspecified: Secondary | ICD-10-CM

## 2020-09-28 DIAGNOSIS — E7849 Other hyperlipidemia: Secondary | ICD-10-CM

## 2020-09-28 DIAGNOSIS — I10 Essential (primary) hypertension: Secondary | ICD-10-CM

## 2020-09-28 NOTE — Telephone Encounter (Signed)
Message left for patient

## 2020-09-28 NOTE — Telephone Encounter (Signed)
Blood work ordered-can come to the lab that morning.

## 2020-09-28 NOTE — Telephone Encounter (Signed)
Please advise as the pt has stated can her labs be ordered so she can have them done the morning of her physical as fasting until 1pm is to long to be fasting for.

## 2020-09-29 DIAGNOSIS — M5416 Radiculopathy, lumbar region: Secondary | ICD-10-CM | POA: Diagnosis not present

## 2020-09-29 NOTE — Telephone Encounter (Signed)
Pt states she will come in the morning tomorrow to get labs done.

## 2020-09-30 ENCOUNTER — Other Ambulatory Visit (INDEPENDENT_AMBULATORY_CARE_PROVIDER_SITE_OTHER): Payer: Medicare Other

## 2020-09-30 ENCOUNTER — Other Ambulatory Visit: Payer: Self-pay

## 2020-09-30 DIAGNOSIS — I10 Essential (primary) hypertension: Secondary | ICD-10-CM | POA: Diagnosis not present

## 2020-09-30 DIAGNOSIS — E039 Hypothyroidism, unspecified: Secondary | ICD-10-CM

## 2020-09-30 DIAGNOSIS — E119 Type 2 diabetes mellitus without complications: Secondary | ICD-10-CM | POA: Diagnosis not present

## 2020-09-30 DIAGNOSIS — E7849 Other hyperlipidemia: Secondary | ICD-10-CM

## 2020-09-30 LAB — COMPREHENSIVE METABOLIC PANEL
ALT: 26 U/L (ref 0–35)
AST: 17 U/L (ref 0–37)
Albumin: 3.9 g/dL (ref 3.5–5.2)
Alkaline Phosphatase: 57 U/L (ref 39–117)
BUN: 14 mg/dL (ref 6–23)
CO2: 30 mEq/L (ref 19–32)
Calcium: 9 mg/dL (ref 8.4–10.5)
Chloride: 103 mEq/L (ref 96–112)
Creatinine, Ser: 0.89 mg/dL (ref 0.40–1.20)
GFR: 68.04 mL/min (ref >60.00)
Glucose, Bld: 118 mg/dL — ABNORMAL HIGH (ref 70–99)
Potassium: 4.3 mEq/L (ref 3.5–5.1)
Sodium: 140 mEq/L (ref 135–145)
Total Bilirubin: 0.4 mg/dL (ref 0.2–1.2)
Total Protein: 6.4 g/dL (ref 6.0–8.3)

## 2020-09-30 LAB — TSH: TSH: 1.39 u[IU]/mL (ref 0.35–5.50)

## 2020-09-30 LAB — CBC WITH DIFFERENTIAL/PLATELET
Basophils Absolute: 0.1 10*3/uL (ref 0.0–0.1)
Basophils Relative: 0.9 % (ref 0.0–3.0)
Eosinophils Absolute: 0.1 10*3/uL (ref 0.0–0.7)
Eosinophils Relative: 2.2 % (ref 0.0–5.0)
HCT: 45.7 % (ref 36.0–46.0)
Hemoglobin: 15.5 g/dL — ABNORMAL HIGH (ref 12.0–15.0)
Lymphocytes Relative: 20.8 % (ref 12.0–46.0)
Lymphs Abs: 1.3 10*3/uL (ref 0.7–4.0)
MCHC: 33.8 g/dL (ref 30.0–36.0)
MCV: 91.2 fl (ref 78.0–100.0)
Monocytes Absolute: 0.4 10*3/uL (ref 0.1–1.0)
Monocytes Relative: 6.3 % (ref 3.0–12.0)
Neutro Abs: 4.4 10*3/uL (ref 1.4–7.7)
Neutrophils Relative %: 69.8 % (ref 43.0–77.0)
Platelets: 275 10*3/uL (ref 150.0–400.0)
RBC: 5.01 Mil/uL (ref 3.87–5.11)
RDW: 13.7 % (ref 11.5–15.5)
WBC: 6.3 10*3/uL (ref 4.0–10.5)

## 2020-09-30 LAB — LIPID PANEL
Cholesterol: 166 mg/dL (ref 0–200)
HDL: 37.4 mg/dL — ABNORMAL LOW (ref >39.00)
LDL Cholesterol: 88 mg/dL (ref 0–99)
NonHDL: 128.18
Total CHOL/HDL Ratio: 4
Triglycerides: 200 mg/dL — ABNORMAL HIGH (ref 0.0–149.0)
VLDL: 40 mg/dL (ref 0.0–40.0)

## 2020-09-30 LAB — HEMOGLOBIN A1C: Hgb A1c MFr Bld: 6.7 % — ABNORMAL HIGH (ref 4.6–6.5)

## 2020-09-30 NOTE — Progress Notes (Signed)
Subjective:    Patient ID: [name] [name], female    DOB: 08-Jun-1955, 65 y.o.   MRN: [id number]   This visit occurred during the SARS-CoV-2 public health emergency.  Safety protocols were in place, including screening questions prior to the visit, additional usage of staff PPE, and extensive cleaning of exam room while observing appropriate contact time as indicated for disinfecting solutions.    HPI She is here for a physical exam.   ? OAB  Medications and allergies reviewed with patient and updated if appropriate.  Patient Active Problem List   Diagnosis Date Noted   Hyperhidrosis 07/21/2020   Cough 12/25/2019   Obese 12/26/2018   Lumbar radiculopathy 12/26/2018   Tremor of both hands 06/26/2018   Vitamin D deficiency 02/06/2017   Iron deficiency 02/06/2017   Herniated cervical disc 06/17/2015   Diabetes mellitus without complication ([place]) 0000000   Palpitations 06/17/2015   Anxiety state 03/28/2015   Nonspecific abnormal electrocardiogram (ECG) (EKG) 03/20/2014   OSA (obstructive sleep apnea) 09/06/2012   Allergic rhinoconjunctivitis, seasonal and perennial 07/02/2012   History of colonic polyps [id number]   METABOLIC SYNDROME X [id number]   GERD 09/11/2008   CHOLELITHIASIS 09/11/2008   FATTY LIVER DISEASE, HX OF 09/11/2008   Hypothyroidism 09/11/2007   Hyperlipidemia 09/11/2007   Essential hypertension 09/11/2007   Memory loss 09/11/2007   CARCINOMA, BASAL CELL, HX OF 05/09/2006    Current Outpatient Medications on File Prior to Visit  Medication Sig Dispense Refill   diclofenac (VOLTAREN) 75 MG EC tablet TAKE ONE TABLET BY MOUTH TWICE A DAY 60 tablet 3   finasteride (PROSCAR) 5 MG tablet Take by mouth.     fluticasone (FLONASE) 50 MCG/ACT nasal spray Place 2 sprays into both nostrils daily. 16 g 1   gabapentin (NEURONTIN) 300 MG capsule Take 300 mg by mouth 3 (three) times daily.     losartan (COZAAR) 100 MG tablet TAKE ONE TABLET BY MOUTH DAILY 90  tablet 1   oxybutynin (DITROPAN-XL) 5 MG 24 hr tablet Take 1 tablet (5 mg total) by mouth at bedtime. 30 tablet 5   rosuvastatin (CRESTOR) 20 MG tablet TAKE ONE TABLET BY MOUTH DAILY  *MUST HAVE APPOINTMENT FOR FURTHER REFILLS* 90 tablet 0   sertraline (ZOLOFT) 50 MG tablet Take 1 tablet (50 mg total) by mouth daily. 90 tablet 1   SYNTHROID 112 MCG tablet TAKE ONE TABLET BY MOUTH DAILY 90 tablet 1   No current facility-administered medications on file prior to visit.    Past Medical History:  Diagnosis Date   Arthritis    Cholelithiasis    Hepatic steatosis    Hyperlipemia    Hypertension    Hypothyroidism    PONV (postoperative nausea and vomiting)     Past Surgical History:  Procedure Laterality Date   ABDOMINAL HYSTERECTOMY     @ age 60, due to fibroids    COLONOSCOPY W/ POLYPECTOMY  2010    Dr.Jacobs, due 2015    DILATION AND CURETTAGE OF UTERUS     MOHS SURGERY  2004   Basal Cell    TONSILLECTOMY     TOTAL KNEE ARTHROPLASTY  09/22/2011   Dr Nicholes Stairs, Left;    Social History   Socioeconomic History   Marital status: Married    Spouse name: Not on file   Number of children: Not on file   Years of education: Not on file   Highest education level: Not on file  Occupational History   Not  on file  Tobacco Use   Smoking status: Never   Smokeless tobacco: Never  Vaping Use   Vaping Use: Never used  Substance and Sexual Activity   Alcohol use: Yes    Comment: rare   Drug use: No   Sexual activity: Not on file  Other Topics Concern   Not on file  Social History Narrative   Not exercising regularly   Social Determinants of Health   Financial Resource Strain: Not on file  Food Insecurity: Not on file  Transportation Needs: Not on file  Physical Activity: Not on file  Stress: Not on file  Social Connections: Not on file    Family History  Problem Relation Age of Onset   Diabetes Mother    Leukemia Mother    Alcohol abuse Father    Lung cancer Father         smoker   Diabetes Brother    Heart attack Maternal Uncle        X3 ; MIs in 21s & 72s   Stroke Maternal Uncle        in his 92s   Adrenal disorder Neg Hx     Review of Systems  Constitutional:  Positive for diaphoresis (improved with oxybutynin). Negative for chills and fever.  Eyes:  Negative for visual disturbance.  Respiratory:  Negative for cough, shortness of breath and wheezing.   Cardiovascular:  Negative for chest pain, palpitations and leg swelling.  Gastrointestinal:  Negative for abdominal pain, blood in stool, constipation, diarrhea and nausea.       Gerd  Genitourinary:  Negative for dysuria.  Musculoskeletal:  Positive for back pain. Negative for arthralgias.  Skin:  Negative for color change and rash.  Neurological:  Positive for headaches (sinus HAs). Negative for light-headedness.  Psychiatric/Behavioral:  Negative for dysphoric mood. The patient is nervous/anxious.       Objective:   Vitals:   10/01/20 1258  BP: 130/78  Pulse: 91  Temp: 98.1 F (36.7 C)  SpO2: 98%   Filed Weights   10/01/20 1258  Weight: 245 lb (111.1 kg)   Body mass index is 38.37 kg/m.  BP Readings from Last 3 Encounters:  10/01/20 130/78  09/06/20 (!) 142/90  07/21/20 132/78    Wt Readings from Last 3 Encounters:  10/01/20 245 lb (111.1 kg)  09/06/20 239 lb 9.6 oz (108.7 kg)  07/21/20 235 lb (106.6 kg)     Physical Exam Constitutional: She appears well-developed and well-nourished. No distress.  HENT:  Head: Normocephalic and atraumatic.  Right Ear: External ear normal. Normal ear canal and TM Left Ear: External ear normal.  Normal ear canal and TM Mouth/Throat: Oropharynx is clear and moist.  Eyes: Conjunctivae and EOM are normal.  Neck: Neck supple. No tracheal deviation present. No thyromegaly present.  No carotid bruit  Cardiovascular: Normal rate, regular rhythm and normal heart sounds.   No murmur heard.  No edema. Pulmonary/Chest: Effort normal and breath  sounds normal. No respiratory distress. She has no wheezes. She has no rales.  Breast: deferred   Abdominal: Soft. She exhibits no distension. There is no tenderness.  Lymphadenopathy: She has no cervical adenopathy.  Skin: Skin is warm and dry. She is not diaphoretic.  Psychiatric: She has a normal mood and affect. Her behavior is normal.     Lab Results  Component Value Date   WBC 6.3 09/30/2020   HGB 15.5 (H) 09/30/2020   HCT 45.7 09/30/2020   PLT 275.0 09/30/2020  GLUCOSE 118 (H) 09/30/2020   CHOL 166 09/30/2020   TRIG 200.0 (H) 09/30/2020   HDL 37.40 (L) 09/30/2020   LDLDIRECT 90.0 02/03/2020   LDLCALC 88 09/30/2020   ALT 26 09/30/2020   AST 17 09/30/2020   NA 140 09/30/2020   K 4.3 09/30/2020   CL 103 09/30/2020   CREATININE 0.89 09/30/2020   BUN 14 09/30/2020   CO2 30 09/30/2020   TSH 1.39 09/30/2020   INR 1.00 09/19/2011   HGBA1C 6.7 (H) 09/30/2020   MICROALBUR 1.0 02/27/2006         Assessment & Plan:   Physical exam: Screening blood work  ordered Exercise  walking Weight  advised weight loss Substance abuse  none  Will get colonoscopy next year  Eye exam up to date  Reviewed recommended immunizations.  Flu and pneumonia vaccine today   Health Maintenance  Topic Date Due   FOOT EXAM  Never done   OPHTHALMOLOGY EXAM  Never done   Zoster Vaccines- Shingrix (1 of 2) Never done   COLONOSCOPY (Pts 45-34yr Insurance coverage will need to be confirmed)  05/06/2019   COVID-19 Vaccine (3 - Booster for Moderna series) 10/19/2019   PNA vac Low Risk Adult (1 of 2 - PCV13) Never done   INFLUENZA VACCINE  08/23/2020   HEMOGLOBIN A1C  03/30/2021   MAMMOGRAM  05/29/2022   TETANUS/TDAP  03/14/2025   DEXA SCAN  09/21/2025   Hepatitis C Screening  Completed   HIV Screening  Completed   HPV VACCINES  Aged Out          See Problem List for Assessment and Plan of chronic medical problems.

## 2020-09-30 NOTE — Patient Instructions (Addendum)
Medications changes include :   none   Flu immunization administered today.     Please followup in 6 months    Health Maintenance, Female Adopting a healthy lifestyle and getting preventive care are important in promoting health and wellness. Ask your health care provider about: The right schedule for you to have regular tests and exams. Things you can do on your own to prevent diseases and keep yourself healthy. What should I know about diet, weight, and exercise? Eat a healthy diet  Eat a diet that includes plenty of vegetables, fruits, low-fat dairy products, and lean protein. Do not eat a lot of foods that are high in solid fats, added sugars, or sodium. Maintain a healthy weight Body mass index (BMI) is used to identify weight problems. It estimates body fat based on height and weight. Your health care provider can help determine your BMI and help you achieve or maintain a healthy weight. Get regular exercise Get regular exercise. This is one of the most important things you can do for your health. Most adults should: Exercise for at least 150 minutes each week. The exercise should increase your heart rate and make you sweat (moderate-intensity exercise). Do strengthening exercises at least twice a week. This is in addition to the moderate-intensity exercise. Spend less time sitting. Even light physical activity can be beneficial. Watch cholesterol and blood lipids Have your blood tested for lipids and cholesterol at 65 years of age, then have this test every 5 years. Have your cholesterol levels checked more often if: Your lipid or cholesterol levels are high. You are older than 65 years of age. You are at high risk for heart disease. What should I know about cancer screening? Depending on your health history and family history, you may need to have cancer screening at various ages. This may include screening for: Breast cancer. Cervical cancer. Colorectal cancer. Skin  cancer. Lung cancer. What should I know about heart disease, diabetes, and high blood pressure? Blood pressure and heart disease High blood pressure causes heart disease and increases the risk of stroke. This is more likely to develop in people who have high blood pressure readings, are of African descent, or are overweight. Have your blood pressure checked: Every 3-5 years if you are 56-16 years of age. Every year if you are 9 years old or older. Diabetes Have regular diabetes screenings. This checks your fasting blood sugar level. Have the screening done: Once every three years after age 33 if you are at a normal weight and have a low risk for diabetes. More often and at a younger age if you are overweight or have a high risk for diabetes. What should I know about preventing infection? Hepatitis B If you have a higher risk for hepatitis B, you should be screened for this virus. Talk with your health care provider to find out if you are at risk for hepatitis B infection. Hepatitis C Testing is recommended for: Everyone born from [birth year] through 1965. Anyone with known risk factors for hepatitis C. Sexually transmitted infections (STIs) Get screened for STIs, including gonorrhea and chlamydia, if: You are sexually active and are younger than 65 years of age. You are older than 65 years of age and your health care provider tells you that you are at risk for this type of infection. Your sexual activity has changed since you were last screened, and you are at increased risk for chlamydia or gonorrhea. Ask your health care provider if you  are at risk. Ask your health care provider about whether you are at high risk for HIV. Your health care provider may recommend a prescription medicine to help prevent HIV infection. If you choose to take medicine to prevent HIV, you should first get tested for HIV. You should then be tested every 3 months for as long as you are taking the medicine. Pregnancy If  you are about to stop having your period (premenopausal) and you may become pregnant, seek counseling before you get pregnant. Take 400 to 800 micrograms (mcg) of folic acid every day if you become pregnant. Ask for birth control (contraception) if you want to prevent pregnancy. Osteoporosis and menopause Osteoporosis is a disease in which the bones lose minerals and strength with aging. This can result in bone fractures. If you are 61 years old or older, or if you are at risk for osteoporosis and fractures, ask your health care provider if you should: Be screened for bone loss. Take a calcium or vitamin D supplement to lower your risk of fractures. Be given hormone replacement therapy (HRT) to treat symptoms of menopause. Follow these instructions at home: Lifestyle Do not use any products that contain nicotine or tobacco, such as cigarettes, e-cigarettes, and chewing tobacco. If you need help quitting, ask your health care provider. Do not use street drugs. Do not share needles. Ask your health care provider for help if you need support or information about quitting drugs. Alcohol use Do not drink alcohol if: Your health care provider tells you not to drink. You are pregnant, may be pregnant, or are planning to become pregnant. If you drink alcohol: Limit how much you use to 0-1 drink a day. Limit intake if you are breastfeeding. Be aware of how much alcohol is in your drink. In the [place] one drink equals one 12 oz bottle of beer (355 mL), one 5 oz glass of wine (148 mL), or one 1 oz glass of hard liquor (44 mL). General instructions Schedule regular health, dental, and eye exams. Stay current with your vaccines. Tell your health care provider if: You often feel depressed. You have ever been abused or do not feel safe at home. Summary Adopting a healthy lifestyle and getting preventive care are important in promoting health and wellness. Follow your health care provider's  instructions about healthy diet, exercising, and getting tested or screened for diseases. Follow your health care provider's instructions on monitoring your cholesterol and blood pressure. This information is not intended to replace advice given to you by your health care provider. Make sure you discuss any questions you have with your health care provider. Document Revised: 03/19/2020 Document Reviewed: 01/02/2018 Elsevier Patient Education  2022 [employer].

## 2020-10-01 ENCOUNTER — Ambulatory Visit (INDEPENDENT_AMBULATORY_CARE_PROVIDER_SITE_OTHER): Payer: Medicare Other | Admitting: Internal Medicine

## 2020-10-01 ENCOUNTER — Encounter: Payer: Self-pay | Admitting: Internal Medicine

## 2020-10-01 VITALS — BP 130/78 | HR 91 | Temp 98.1°F | Ht 67.0 in | Wt 245.0 lb

## 2020-10-01 DIAGNOSIS — Z23 Encounter for immunization: Secondary | ICD-10-CM

## 2020-10-01 DIAGNOSIS — M5416 Radiculopathy, lumbar region: Secondary | ICD-10-CM | POA: Diagnosis not present

## 2020-10-01 DIAGNOSIS — E7849 Other hyperlipidemia: Secondary | ICD-10-CM

## 2020-10-01 DIAGNOSIS — E039 Hypothyroidism, unspecified: Secondary | ICD-10-CM

## 2020-10-01 DIAGNOSIS — R61 Generalized hyperhidrosis: Secondary | ICD-10-CM | POA: Diagnosis not present

## 2020-10-01 DIAGNOSIS — F411 Generalized anxiety disorder: Secondary | ICD-10-CM

## 2020-10-01 DIAGNOSIS — I10 Essential (primary) hypertension: Secondary | ICD-10-CM

## 2020-10-01 DIAGNOSIS — E119 Type 2 diabetes mellitus without complications: Secondary | ICD-10-CM

## 2020-10-01 DIAGNOSIS — Z0000 Encounter for general adult medical examination without abnormal findings: Secondary | ICD-10-CM | POA: Diagnosis not present

## 2020-10-01 DIAGNOSIS — R251 Tremor, unspecified: Secondary | ICD-10-CM

## 2020-10-01 NOTE — Assessment & Plan Note (Signed)
Chronic Management per Dr Thedore Mins - ortho Taking gabapentin, diclofenac Pain tolerated May need surgery

## 2020-10-01 NOTE — Assessment & Plan Note (Signed)
Chronic Controlled Continue sertraline 50 mg daily - causing tremors, but does not want to change now

## 2020-10-01 NOTE — Addendum Note (Signed)
Addended by: Marcina Millard on: 10/01/2020 02:13 PM   Modules accepted: Orders

## 2020-10-01 NOTE — Assessment & Plan Note (Signed)
Chronic Improved with oxybutynin 5 mg daily - will continue- has improved her quality of life Work up negative w/ endocrine Not related to SSRI

## 2020-10-01 NOTE — Assessment & Plan Note (Signed)
Chronic LDL well controlled work on exercise, diet for trigs Continue crestor 20 mg daily

## 2020-10-01 NOTE — Assessment & Plan Note (Signed)
Chronic BP well controlled Continue losartan 100 mg daily cmp  

## 2020-10-01 NOTE — Assessment & Plan Note (Signed)
Chronic  Clinically euthyroid tsh normal  Continue synthroid 112 mcg daily

## 2020-10-01 NOTE — Assessment & Plan Note (Signed)
chronic Related to sertraline - did resolve when she stopped the medication

## 2020-10-01 NOTE — Assessment & Plan Note (Signed)
Chronic Well controlled with diet Stressed diabetic diet and exercise

## 2020-10-04 DIAGNOSIS — L570 Actinic keratosis: Secondary | ICD-10-CM | POA: Diagnosis not present

## 2020-10-04 DIAGNOSIS — L820 Inflamed seborrheic keratosis: Secondary | ICD-10-CM | POA: Diagnosis not present

## 2020-10-04 DIAGNOSIS — Z85828 Personal history of other malignant neoplasm of skin: Secondary | ICD-10-CM | POA: Diagnosis not present

## 2020-10-04 DIAGNOSIS — L648 Other androgenic alopecia: Secondary | ICD-10-CM | POA: Diagnosis not present

## 2020-10-04 DIAGNOSIS — L578 Other skin changes due to chronic exposure to nonionizing radiation: Secondary | ICD-10-CM | POA: Diagnosis not present

## 2020-10-05 DIAGNOSIS — M5416 Radiculopathy, lumbar region: Secondary | ICD-10-CM | POA: Diagnosis not present

## 2020-10-08 ENCOUNTER — Other Ambulatory Visit: Payer: Self-pay | Admitting: Internal Medicine

## 2020-10-22 DIAGNOSIS — M5416 Radiculopathy, lumbar region: Secondary | ICD-10-CM | POA: Diagnosis not present

## 2020-10-28 DIAGNOSIS — M4316 Spondylolisthesis, lumbar region: Secondary | ICD-10-CM | POA: Diagnosis not present

## 2020-12-08 ENCOUNTER — Other Ambulatory Visit: Payer: Self-pay | Admitting: Neurological Surgery

## 2020-12-21 ENCOUNTER — Telehealth (INDEPENDENT_AMBULATORY_CARE_PROVIDER_SITE_OTHER): Payer: Medicare Other | Admitting: Family Medicine

## 2020-12-21 ENCOUNTER — Encounter: Payer: Self-pay | Admitting: Family Medicine

## 2020-12-21 DIAGNOSIS — R519 Headache, unspecified: Secondary | ICD-10-CM

## 2020-12-21 DIAGNOSIS — R0981 Nasal congestion: Secondary | ICD-10-CM | POA: Diagnosis not present

## 2020-12-21 MED ORDER — AZITHROMYCIN 250 MG PO TABS: ORAL_TABLET | ORAL | 0 refills | Status: DC

## 2020-12-21 NOTE — Progress Notes (Signed)
Virtual Visit via Video Note  I connected with Tzivia  on 12/21/20 at 10:00 AM EST by a video enabled telemedicine application and verified that I am speaking with the correct person using two identifiers.  Location patient: home, [place] Location provider:work or home office Persons participating in the virtual visit: patient, provider  I discussed the limitations of evaluation and management by telemedicine and the availability of in person appointments. The patient expressed understanding and agreed to proceed.   HPI:  Acute telemedicine visit for sinus congestion: -Onset: has had sinus issues for a few weeks, but much worse the last 4 days -had one negative covid test -Symptoms include:sore throat, nasal congestion, cough, sinus discomfort - maxillary, some discolored thick nasal congestion -Denies: fever, CP, SOB, NVD, body aches, flu/covid/rsv exposures -Has tried: vicks cold and flu -Pertinent past medical history:see below, allergies, hx of sinus infections - feels this is a sinus infection -Pertinent medication allergies:  Allergies  Allergen Reactions   Ampicillin Rash    rash   Doxycycline Other (See Comments)    Joint symptoms w/o rash or fever   Effexor [Venlafaxine] Other (See Comments)    Constipation, elevated BP   Gabapentin Other (See Comments)    Made her "not there"   Oxycodone Nausea Only   Penicillins Rash  -COVID-19 vaccine status:  Immunization History  Administered Date(s) Administered   Fluad Quad(high Dose 65+) 10/01/2020   Influenza Whole 10/24/2011   Influenza,inj,Quad PF,6+ Mos 11/30/2016, 10/16/2017, 10/26/2018, 10/20/2019   Influenza-Unspecified 10/24/2014   Moderna Sars-Covid-2 Vaccination 04/21/2019, 05/19/2019, 10/24/2019, 10/23/2020   PNEUMOCOCCAL CONJUGATE-20 10/01/2020   Td 12/30/2008   Tdap 03/15/2015    ROS: See pertinent positives and negatives per HPI.  Past Medical History:  Diagnosis Date   Arthritis    Cholelithiasis     Hepatic steatosis    Hyperlipemia    Hypertension    Hypothyroidism    PONV (postoperative nausea and vomiting)     Past Surgical History:  Procedure Laterality Date   ABDOMINAL HYSTERECTOMY     @ age 12, due to fibroids    COLONOSCOPY W/ POLYPECTOMY  2010    Dr.Jacobs, due 2015    DILATION AND CURETTAGE OF UTERUS     MOHS SURGERY  2004   Basal Cell    TONSILLECTOMY     TOTAL KNEE ARTHROPLASTY  09/22/2011   Dr Nicholes Stairs, Left;     Current Outpatient Medications:    azithromycin (ZITHROMAX) 250 MG tablet, 2 tabs day 1, then one tab daily, Disp: 6 tablet, Rfl: 0   diclofenac (VOLTAREN) 75 MG EC tablet, TAKE ONE TABLET BY MOUTH TWICE A DAY, Disp: 60 tablet, Rfl: 3   finasteride (PROSCAR) 5 MG tablet, Take by mouth., Disp: , Rfl:    fluticasone (FLONASE) 50 MCG/ACT nasal spray, Place 2 sprays into both nostrils daily. (Patient taking differently: Place 2 sprays into both nostrils as needed.), Disp: 16 g, Rfl: 1   gabapentin (NEURONTIN) 300 MG capsule, Take 300 mg by mouth daily., Disp: , Rfl:    losartan (COZAAR) 100 MG tablet, TAKE ONE TABLET BY MOUTH DAILY, Disp: 90 tablet, Rfl: 1   oxybutynin (DITROPAN-XL) 5 MG 24 hr tablet, Take 1 tablet (5 mg total) by mouth at bedtime., Disp: 30 tablet, Rfl: 5   rosuvastatin (CRESTOR) 20 MG tablet, TAKE ONE TABLET BY MOUTH DAILY **MUST CALL MD FOR APPOINTMENT, Disp: 90 tablet, Rfl: 2   sertraline (ZOLOFT) 50 MG tablet, Take 1 tablet (50 mg total) by  mouth daily., Disp: 90 tablet, Rfl: 1   SYNTHROID 112 MCG tablet, TAKE ONE TABLET BY MOUTH DAILY, Disp: 90 tablet, Rfl: 1  EXAM:  VITALS per patient if applicable:  GENERAL: alert, oriented, appears well and in no acute distress  HEENT: atraumatic, conjunttiva clear, no obvious abnormalities on inspection of external nose and ears  NECK: normal movements of the head and neck  LUNGS: on inspection no signs of respiratory distress, breathing rate appears normal, no obvious gross SOB, gasping or  wheezing  CV: no obvious cyanosis  MS: moves all visible extremities without noticeable abnormality  PSYCH/NEURO: pleasant and cooperative, no obvious depression or anxiety, speech and thought processing grossly intact  ASSESSMENT AND PLAN:  Discussed the following assessment and plan:  Facial discomfort  Nasal sinus congestion  -we discussed possible serious and likely etiologies, options for evaluation and workup, limitations of telemedicine visit vs in person visit, treatment, treatment risks and precautions. Pt is agreeable to treatment via telemedicine at this moment. Given duration of symptoms and worsening possible bacterial sinusitis vs new acute viral sinusitis vs other. She had a negative covid test and agrees to test once more. She wants to start azithromycin which she reports PCP provides when she has these symptoms. She has a trip coming up. Discussed risks/benefits/proper use. Rx sent.  Advised to seek prompt follow up or in person care if worsening, new symptoms arise, or if is not improving with treatment. Discussed options for inperson care if PCP office not available. Did let this patient know that I only do telemedicine on Tuesdays and Thursdays for [place]. Advised to schedule follow up visit with PCP or [facility] if any further questions or concerns to avoid delays in care.   I discussed the assessment and treatment plan with the patient. The patient was provided an opportunity to ask questions and all were answered. The patient agreed with the plan and demonstrated an understanding of the instructions.     Lucretia Kern, DO

## 2020-12-21 NOTE — Patient Instructions (Signed)
-  I sent the medication(s) we discussed to your pharmacy: Meds ordered this encounter  Medications   azithromycin (ZITHROMAX) 250 MG tablet    Sig: 2 tabs day 1, then one tab daily    Dispense:  6 tablet    Refill:  0    I hope you are feeling better soon!  Seek in person care promptly if your symptoms worsen, new concerns arise or you are not improving with treatment.  It was nice to meet you today. I help [facility] out with telemedicine visits on Tuesdays and Thursdays and am available for visits on those days. If you have any concerns or questions following this visit please schedule a follow up visit with your Primary Care doctor or seek care at a local urgent care clinic to avoid delays in care.

## 2021-01-13 ENCOUNTER — Other Ambulatory Visit: Payer: Self-pay | Admitting: Internal Medicine

## 2021-01-20 DIAGNOSIS — I10 Essential (primary) hypertension: Secondary | ICD-10-CM | POA: Diagnosis not present

## 2021-01-20 DIAGNOSIS — M4316 Spondylolisthesis, lumbar region: Secondary | ICD-10-CM | POA: Diagnosis not present

## 2021-01-23 ENCOUNTER — Other Ambulatory Visit: Payer: Self-pay | Admitting: Internal Medicine

## 2021-01-26 ENCOUNTER — Other Ambulatory Visit: Payer: Self-pay | Admitting: Internal Medicine

## 2021-01-28 NOTE — Progress Notes (Signed)
Surgical Instructions    Your procedure is scheduled on 02/02/21.  Report to [facility] Main Entrance "A" at 10:30 A.M., then check in with the Admitting office.  Call this number if you have problems the morning of surgery:  [phone]   If you have any questions prior to your surgery date call [phone]: Open Monday-Friday 8am-4pm    Remember:  Do not eat after midnight the night before your surgery  You may drink clear liquids until 9:30 the morning of your surgery.   Clear liquids allowed are: Water, Non-Citrus Juices (without pulp), Carbonated Beverages, Clear Tea, Black Coffee ONLY (NO MILK, CREAM OR POWDERED CREAMER of any kind), and Gatorade    Take these medicines the morning of surgery with A SIP OF WATER:  finasteride (PROSCAR) gabapentin (NEURONTIN) rosuvastatin (CRESTOR) sertraline (ZOLOFT)  SYNTHROID  IF NEEDED: fluticasone (FLONASE) REFRESH EYE DROPS   As of today, STOP taking any Aspirin (unless otherwise instructed by your surgeon), diclofenac (VOLTAREN), Aleve, Naproxen, Ibuprofen, Motrin, Advil, Goody's, BC's, all herbal medications, fish oil, and all vitamins.     After your COVID test   You are not required to quarantine however you are required to wear a well-fitting mask when you are out and around people not in your household.  If your mask becomes wet or soiled, replace with a new one.  Wash your hands often with soap and water for 20 seconds or clean your hands with an alcohol-based hand sanitizer that contains at least 60% alcohol.  Do not share personal items.  Notify your provider: if you are in close contact with someone who has COVID  or if you develop a fever of 100.4 or greater, sneezing, cough, sore throat, shortness of breath or body aches.           Do not wear jewelry or makeup Do not wear lotions, powders, perfumes or deodorant. Do not shave 48 hours prior to surgery.   Do not bring valuables to the hospital. DO Not wear  nail polish, gel polish, artificial nails, or any other type of covering on natural nails including finger and toenails. If patients have artificial nails, gel coating, etc. that need to be removed by a nail salon, please have this removed prior to surgery or surgery may need to be canceled/delayed if the surgeon/ anesthesia feels like the patient is unable to be adequately monitored.             [place] is not responsible for any belongings or valuables.  Do NOT Smoke (Tobacco/Vaping)  24 hours prior to your procedure  If you use a CPAP at night, you may bring your mask for your overnight stay.   Contacts, glasses, hearing aids, dentures or partials may not be worn into surgery, please bring cases for these belongings   For patients admitted to the hospital, discharge time will be determined by your treatment team.   Patients discharged the day of surgery will not be allowed to drive home, and someone needs to stay with them for 24 hours.  NO VISITORS WILL BE ALLOWED IN PRE-OP WHERE PATIENTS ARE PREPPED FOR SURGERY.  ONLY 1 SUPPORT PERSON MAY BE PRESENT IN THE WAITING ROOM WHILE YOU ARE IN SURGERY.  IF YOU ARE TO BE ADMITTED, ONCE YOU ARE IN YOUR ROOM YOU WILL BE ALLOWED TWO (2) VISITORS. 1 (ONE) VISITOR MAY STAY OVERNIGHT BUT MUST ARRIVE TO THE ROOM BY 8pm.  Minor children may have two parents present. Special consideration for  safety and communication needs will be reviewed on a case by case basis.  Special instructions:    Oral Hygiene is also important to reduce your risk of infection.  Remember - BRUSH YOUR TEETH THE MORNING OF SURGERY WITH YOUR REGULAR TOOTHPASTE   [facility]- Preparing For Surgery  Before surgery, you can play an important role. Because skin is not sterile, your skin needs to be as free of germs as possible. You can reduce the number of germs on your skin by washing with CHG (chlorahexidine gluconate) Soap before surgery.  CHG is an antiseptic cleaner which kills  germs and bonds with the skin to continue killing germs even after washing.     Please do not use if you have an allergy to CHG or antibacterial soaps. If your skin becomes reddened/irritated stop using the CHG.  Do not shave (including legs and underarms) for at least 48 hours prior to first CHG shower. It is OK to shave your face.  Please follow these instructions carefully.     Shower the NIGHT BEFORE SURGERY and the MORNING OF SURGERY with CHG Soap.   If you chose to wash your hair, wash your hair first as usual with your normal shampoo. After you shampoo, rinse your hair and body thoroughly to remove the shampoo.  Then [employer] and genitals (private parts) with your normal soap and rinse thoroughly to remove soap.  After that Use CHG Soap as you would any other liquid soap. You can apply CHG directly to the skin and wash gently with a scrungie or a clean washcloth.   Apply the CHG Soap to your body ONLY FROM THE NECK DOWN.  Do not use on open wounds or open sores. Avoid contact with your eyes, ears, mouth and genitals (private parts). Wash Face and genitals (private parts)  with your normal soap.   Wash thoroughly, paying special attention to the area where your surgery will be performed.  Thoroughly rinse your body with warm water from the neck down.  DO NOT shower/wash with your normal soap after using and rinsing off the CHG Soap.  Pat yourself dry with a CLEAN TOWEL.  Wear CLEAN PAJAMAS to bed the night before surgery  Place CLEAN SHEETS on your bed the night before your surgery  DO NOT SLEEP WITH PETS.   Day of Surgery:  Take a shower with CHG soap. Wear Clean/Comfortable clothing the morning of surgery Do not apply any deodorants/lotions.   Remember to brush your teeth WITH YOUR REGULAR TOOTHPASTE.   Please read over the following fact sheets that you were given.

## 2021-01-31 ENCOUNTER — Encounter (HOSPITAL_COMMUNITY)
Admission: RE | Admit: 2021-01-31 | Discharge: 2021-01-31 | Disposition: A | Discharge status: Home / Self Care | Payer: Medicare Other | Source: Ambulatory Visit | Attending: Neurological Surgery | Admitting: Neurological Surgery

## 2021-01-31 ENCOUNTER — Encounter (HOSPITAL_COMMUNITY): Payer: Self-pay

## 2021-01-31 ENCOUNTER — Other Ambulatory Visit: Payer: Self-pay

## 2021-01-31 VITALS — BP 151/77 | HR 83 | Temp 98.1°F | Resp 18 | Ht 66.0 in | Wt 241.0 lb

## 2021-01-31 DIAGNOSIS — Z01818 Encounter for other preprocedural examination: Secondary | ICD-10-CM

## 2021-01-31 DIAGNOSIS — Z01812 Encounter for preprocedural laboratory examination: Secondary | ICD-10-CM | POA: Insufficient documentation

## 2021-01-31 DIAGNOSIS — E669 Obesity, unspecified: Secondary | ICD-10-CM | POA: Insufficient documentation

## 2021-01-31 DIAGNOSIS — K760 Fatty (change of) liver, not elsewhere classified: Secondary | ICD-10-CM | POA: Insufficient documentation

## 2021-01-31 DIAGNOSIS — Z880 Allergy status to penicillin: Secondary | ICD-10-CM | POA: Diagnosis not present

## 2021-01-31 DIAGNOSIS — M4316 Spondylolisthesis, lumbar region: Secondary | ICD-10-CM | POA: Insufficient documentation

## 2021-01-31 DIAGNOSIS — Z96652 Presence of left artificial knee joint: Secondary | ICD-10-CM | POA: Diagnosis not present

## 2021-01-31 DIAGNOSIS — G4733 Obstructive sleep apnea (adult) (pediatric): Secondary | ICD-10-CM | POA: Insufficient documentation

## 2021-01-31 DIAGNOSIS — Z20822 Contact with and (suspected) exposure to covid-19: Secondary | ICD-10-CM | POA: Insufficient documentation

## 2021-01-31 DIAGNOSIS — R7303 Prediabetes: Secondary | ICD-10-CM | POA: Insufficient documentation

## 2021-01-31 DIAGNOSIS — E039 Hypothyroidism, unspecified: Secondary | ICD-10-CM | POA: Insufficient documentation

## 2021-01-31 DIAGNOSIS — Z806 Family history of leukemia: Secondary | ICD-10-CM | POA: Diagnosis not present

## 2021-01-31 DIAGNOSIS — Z79890 Hormone replacement therapy (postmenopausal): Secondary | ICD-10-CM | POA: Diagnosis not present

## 2021-01-31 DIAGNOSIS — Z6838 Body mass index (BMI) 38.0-38.9, adult: Secondary | ICD-10-CM | POA: Diagnosis not present

## 2021-01-31 DIAGNOSIS — Z8249 Family history of ischemic heart disease and other diseases of the circulatory system: Secondary | ICD-10-CM | POA: Diagnosis not present

## 2021-01-31 DIAGNOSIS — M48062 Spinal stenosis, lumbar region with neurogenic claudication: Secondary | ICD-10-CM | POA: Diagnosis not present

## 2021-01-31 DIAGNOSIS — Z823 Family history of stroke: Secondary | ICD-10-CM | POA: Diagnosis not present

## 2021-01-31 DIAGNOSIS — I10 Essential (primary) hypertension: Secondary | ICD-10-CM | POA: Insufficient documentation

## 2021-01-31 DIAGNOSIS — M5416 Radiculopathy, lumbar region: Secondary | ICD-10-CM | POA: Diagnosis not present

## 2021-01-31 DIAGNOSIS — Z90710 Acquired absence of both cervix and uterus: Secondary | ICD-10-CM | POA: Diagnosis not present

## 2021-01-31 DIAGNOSIS — Z833 Family history of diabetes mellitus: Secondary | ICD-10-CM | POA: Diagnosis not present

## 2021-01-31 DIAGNOSIS — Z79899 Other long term (current) drug therapy: Secondary | ICD-10-CM | POA: Diagnosis not present

## 2021-01-31 DIAGNOSIS — Z801 Family history of malignant neoplasm of trachea, bronchus and lung: Secondary | ICD-10-CM | POA: Diagnosis not present

## 2021-01-31 DIAGNOSIS — E785 Hyperlipidemia, unspecified: Secondary | ICD-10-CM | POA: Insufficient documentation

## 2021-01-31 DIAGNOSIS — K219 Gastro-esophageal reflux disease without esophagitis: Secondary | ICD-10-CM | POA: Diagnosis not present

## 2021-01-31 DIAGNOSIS — Z9889 Other specified postprocedural states: Secondary | ICD-10-CM | POA: Diagnosis not present

## 2021-01-31 HISTORY — DX: Cardiac arrhythmia, unspecified: I49.9

## 2021-01-31 HISTORY — DX: Cardiac murmur, unspecified: R01.1

## 2021-01-31 HISTORY — DX: Prediabetes: R73.03

## 2021-01-31 HISTORY — DX: Personal history of other specified conditions: Z87.898

## 2021-01-31 LAB — BASIC METABOLIC PANEL WITH GFR
Anion gap: 7 (ref 5–15)
BUN: 14 mg/dL (ref 8–23)
CO2: 27 mmol/L (ref 22–32)
Calcium: 8.9 mg/dL (ref 8.9–10.3)
Chloride: 105 mmol/L (ref 98–111)
Creatinine, Ser: 0.92 mg/dL (ref 0.44–1.00)
GFR, Estimated: >60 mL/min
Glucose, Bld: 115 mg/dL — ABNORMAL HIGH (ref 70–99)
Potassium: 3.8 mmol/L (ref 3.5–5.1)
Sodium: 139 mmol/L (ref 135–145)

## 2021-01-31 LAB — CBC
HCT: 47.5 % — ABNORMAL HIGH (ref 36.0–46.0)
Hemoglobin: 15.5 g/dL — ABNORMAL HIGH (ref 12.0–15.0)
MCH: 30 pg (ref 26.0–34.0)
MCHC: 32.6 g/dL (ref 30.0–36.0)
MCV: 92.1 fL (ref 80.0–100.0)
Platelets: 354 K/uL (ref 150–400)
RBC: 5.16 MIL/uL — ABNORMAL HIGH (ref 3.87–5.11)
RDW: 12.8 % (ref 11.5–15.5)
WBC: 6.4 K/uL (ref 4.0–10.5)
nRBC: 0 % (ref 0.0–0.2)

## 2021-01-31 LAB — SURGICAL PCR SCREEN
MRSA, PCR: NEGATIVE
Staphylococcus aureus: NEGATIVE

## 2021-01-31 LAB — SARS CORONAVIRUS 2 (TAT 6-24 HRS): SARS Coronavirus 2: NEGATIVE

## 2021-01-31 LAB — TYPE AND SCREEN
ABO/RH(D): O POS
Antibody Screen: NEGATIVE

## 2021-01-31 LAB — GLUCOSE, CAPILLARY: Glucose-Capillary: 120 mg/dL — ABNORMAL HIGH (ref 70–99)

## 2021-01-31 NOTE — Progress Notes (Signed)
PCP - Billey Gosling, MD Cardiologist - denies  PPM/ICD - denies Device Orders - n/a Rep Notified - n/a  Chest x-ray - n/a EKG - 01/31/2021 Stress Test - 06/30/2014 ECHO - 06/30/2014 Cardiac Cath - denies  Sleep Study - denies CPAP - n/a  Fasting Blood Sugar - n/a CBG today 120 - pre-diabetes; patient is not checking CBG at home  Blood Thinner Instructions: n/a  Aspirin Instructions: Patient was instructed: As of today, STOP taking any Aspirin (unless otherwise instructed by your surgeon), diclofenac (VOLTAREN), Aleve, Naproxen, Ibuprofen, Motrin, Advil, Goody's, BC's, all herbal medications, fish oil, and all vitamins.    ERAS Protcol - yes PRE-SURGERY Ensure or G2- n/a  COVID TEST- done in PAT on 01/31/2021   Anesthesia review: yes, cardiac history. Patient has history of heart murmur but she denies any symptoms at this time. Patient verbalized that she is not seeing any cardiologist since 2016.  Patient denies shortness of breath, fever, cough and chest pain at PAT appointment   All instructions explained to the patient, with a verbal understanding of the material. Patient agrees to go over the instructions while at home for a better understanding. Patient also instructed to self quarantine after being tested for COVID-19. The opportunity to ask questions was provided.

## 2021-02-01 ENCOUNTER — Encounter (HOSPITAL_COMMUNITY): Payer: Self-pay

## 2021-02-01 NOTE — Progress Notes (Signed)
Anesthesia Chart Review:  Case: [id number] Date/Time: 02/02/21 1215   Procedure: L3-4 MIS TLIF   Anesthesia type: General   Pre-op diagnosis: Spondylolisthesis, Lumbar region   Location: [facility] OR ROOM 67 / [place] OR   Surgeons: [name], MD       DISCUSSION: Patient is a 66 year old female scheduled for the above procedure.  History includes never smoker, post-operative N/V, HLD, HTN, murmur (aortic valve sclerosis without stenosis 06/2014 echo), dysrhythmia (palpitations 2016, 2018), hypothyroidism, hepatic steatosis, prediabetes (A1c 09/30/20 was elevated at 6.7%, previously 6.3% 02/03/20), arthritis (s/p left TKA 09/22/11), OSA (mild OSA with AHI 8, mild oxygen desaturation 2014).  BMI is consistent with obesity.   01/31/2021 presurgical COVID-19 test negative.  Anesthesia team to evaluate on the day of surgery.   VS: BP (!) 151/77    Pulse 83    Temp 36.7 C (Oral)    Resp 18    Ht 5\' 6"  (1.676 m)    Wt 109.3 kg    SpO2 98%    BMI 38.90 kg/m    PROVIDERS: Binnie Rail, MD is PCP  - Renato Shin, MD is endocrinologist. Evaluation on 09/06/20 for excessive diaphoresis. She had labs to evaluate for pheochromocytoma, and results were low or normal (considered "normal" by Dr. Loanne Drilling).  - She is not routinely followed by cardiology but had an evaluation in 2016 by Croitoru, Dani Gobble, MD for palpitations and LAD/LAFB on EKG. She had a normal ETT and echo showed LVEF 55-60%, moderate LVH, grade 1 diastolic dysfunction, AV sclerosis without stenosis, normal LA size. Last visit 03/06/16 for recurrent palpitations in the setting of work stressors, and they improved after she quit her job. Suspected palpitations were likely adrenergically driven PACs and/or PVCs. He offered to change her medication to a b-blocker is she desired, but she preferred to continue to monitor since her work-related stressors were resolving. Weight loss encouraged. As needed cardiology follow-up planned.Marland Kitchen    LABS: Labs reviewed:  Acceptable for surgery. Normal LFTs and TSH with A1c 6.7% 09/30/20. (all labs ordered are listed, but only abnormal results are displayed)  Labs Reviewed  GLUCOSE, CAPILLARY - Abnormal; Notable for the following components:      Result Value   Glucose-Capillary 120 (*)    All other components within normal limits  BASIC METABOLIC PANEL - Abnormal; Notable for the following components:   Glucose, Bld 115 (*)    All other components within normal limits  CBC - Abnormal; Notable for the following components:   RBC 5.16 (*)    Hemoglobin 15.5 (*)    HCT 47.5 (*)    All other components within normal limits  SURGICAL PCR SCREEN  SARS CORONAVIRUS 2 (TAT 6-24 HRS)  TYPE AND SCREEN    Home Sleep Study 09/14/12: Impression: 1.  Mild obstructive sleep apnea/hypopnea syndrome, with an AHI of 8 events per hour and desaturation as low as 82%.  The patient also had numerous other episodes of airflow reduction that did not meet criteria for obstructive hypopnea because of a lack of oxygen desaturation. 2.  Mild bradycardia noted at times during the night. Recommendations: Treatment for this degree of sleep apnea can include a trial of weight loss alone, upper airway surgery, dental appliance, and CPAP.   IMAGES: MRI L-spine 02/26/20 (Canopy/PACS): IMPRESSION: 1. Advanced facet arthrosis from L3-4 to L5-S1. 2. Grade 1 anterolisthesis at L3-4 with severe spinal stenosis.    EKG: 01/31/21: Normal sinus rhythm Left axis deviation Abnormal ECG When compared  with ECG of 04-Jan-2020 01:52, No significant change was found Confirmed by Buford Dresser [id number]) on 02/01/2021 8:39:35 AM - She had EKG findings of LAD dating back to at least 05/21/14 and also septal infarct (age undetermined) on 12/30/14 tracing. She had cardiac work-up in June 2016 (see below).    CV: Echo 06/30/14: Study Conclusions  - Left ventricle: The cavity size was normal. There was moderate    concentric hypertrophy. Systolic  function was normal. The    estimated ejection fraction was in the range of 55% to 60%. Wall    motion was normal; there were no regional wall motion    abnormalities. Doppler parameters are consistent with abnormal    left ventricular relaxation (grade 1 diastolic dysfunction). The    E/e&' ratio is between 8-15, suggesting indeterminate LV filling    pressure.  - Aortic valve: Sclerosis without stenosis. There was no    regurgitation.  - Mitral valve: Calcified annulus.  - Left atrium: The atrium was normal in size.   Impressions:  - LVEF 55-60%, moderate LVH, diastolic dysfunction, indeterminate    LV filling pressure, aortic valve sclerosis, normal LA Size.    ETT 06/30/14: There was no ST segment deviation noted during stress. No T wave inversion was noted during stress.   Negative, adequate GXT with the patient exercising to 101^ APMHR and a workload of 7 mets. No chest pain or ECG changes of ischemia. Rare isolated PVC's in early recovery   Past Medical History:  Diagnosis Date   Arthritis    Cholelithiasis    Dysrhythmia    Heart murmur    Hepatic steatosis    History of prediabetes    Hyperlipemia    Hypertension    Hypothyroidism    PONV (postoperative nausea and vomiting)    Pre-diabetes     Past Surgical History:  Procedure Laterality Date   ABDOMINAL HYSTERECTOMY     @ age 75, due to fibroids    COLONOSCOPY W/ POLYPECTOMY  01/24/2008   Dr.Jacobs, due 2015    DILATION AND CURETTAGE OF UTERUS     JOINT REPLACEMENT     left knee   MOHS SURGERY  01/23/2002   Basal Cell    TONSILLECTOMY     TOTAL KNEE ARTHROPLASTY  09/22/2011   Dr Nicholes Stairs, Left;    MEDICATIONS:  azithromycin (ZITHROMAX) 250 MG tablet   cholecalciferol (VITAMIN D3) 25 MCG (1000 UNIT) tablet   diclofenac (VOLTAREN) 75 MG EC tablet   ferrous sulfate 325 (65 FE) MG tablet   finasteride (PROSCAR) 5 MG tablet   fluticasone (FLONASE) 50 MCG/ACT nasal spray   gabapentin (NEURONTIN) 100 MG  capsule   losartan (COZAAR) 100 MG tablet   metroNIDAZOLE (METROGEL) 1 % gel   Multiple Vitamins-Minerals (HAIR/SKIN/NAILS/BIOTIN PO)   oxybutynin (DITROPAN-XL) 5 MG 24 hr tablet   Polyvinyl Alcohol-Povidone PF (REFRESH) 1.4-0.6 % SOLN   rosuvastatin (CRESTOR) 20 MG tablet   sertraline (ZOLOFT) 50 MG tablet   SYNTHROID 112 MCG tablet   vitamin B-12 (CYANOCOBALAMIN) 1000 MCG tablet   No current facility-administered medications for this encounter.    Myra Gianotti, PA-C Surgical Short Stay/Anesthesiology [facility] Phone [phone] [facility] Phone [phone] 02/01/2021 10:30 AM

## 2021-02-01 NOTE — Anesthesia Preprocedure Evaluation (Addendum)
Anesthesia Evaluation  Patient identified by MRN, date of birth, ID band Patient awake    Reviewed: Allergy & Precautions, NPO status , Patient's Chart, lab work & pertinent test results  History of Anesthesia Complications (+) PONV and history of anesthetic complications (did well with knee surgery 2013- zofran, decadron and scop)  Airway Mallampati: II  TM Distance: >3 FB Neck ROM: Full    Dental  (+) Teeth Intact, Dental Advisory Given   Pulmonary sleep apnea (was never given a CPAP) ,  Home Sleep Study 09/14/12: Impression: 1.  Mild obstructive sleep apnea/hypopnea syndrome, with an AHI of 8 events per hour and desaturation as low as 82%.  The patient also had numerous other episodes of airflow reduction that did not meet criteria for obstructive hypopnea because of a lack of oxygen desaturation. 2.  Mild bradycardia noted at times during the night. Recommendations: Treatment for this degree of sleep apnea can include a trial of weight loss alone, upper airway surgery, dental appliance, and CPAP.   Pulmonary exam normal breath sounds clear to auscultation       Cardiovascular hypertension, Pt. on medications Normal cardiovascular exam Rhythm:Regular Rate:Normal  Echo 2016:   LVEF 55-60%, moderate LVH, diastolic dysfunction, indeterminate  LV filling pressure, aortic valve sclerosis, normal LA Size.    Neuro/Psych PSYCHIATRIC DISORDERS Anxiety negative neurological ROS     GI/Hepatic Neg liver ROS, GERD  Controlled,  Endo/Other  diabetes, Well Controlled, Type 2Hypothyroidism BMI 39 a1c 6.7  Renal/GU negative Renal ROS  negative genitourinary   Musculoskeletal  (+) Arthritis , Osteoarthritis,    Abdominal   Peds  Hematology hct 47.5   Anesthesia Other Findings   Reproductive/Obstetrics negative OB ROS                          Anesthesia Physical Anesthesia Plan  ASA: 2  Anesthesia  Plan: General   Post-op Pain Management: Tylenol PO (pre-op)   Induction: Intravenous  PONV Risk Score and Plan: 4 or greater and Ondansetron, Dexamethasone, Midazolam, Treatment may vary due to age or medical condition and Scopolamine patch - Pre-op  Airway Management Planned: Oral ETT  Additional Equipment: None  Intra-op Plan:   Post-operative Plan: Extubation in OR  Informed Consent: I have reviewed the patients History and Physical, chart, labs and discussed the procedure including the risks, benefits and alternatives for the proposed anesthesia with the patient or authorized representative who has indicated his/her understanding and acceptance.     Dental advisory given  Plan Discussed with: CRNA  Anesthesia Plan Comments:       Anesthesia Quick Evaluation

## 2021-02-02 ENCOUNTER — Inpatient Hospital Stay (HOSPITAL_COMMUNITY): Payer: Medicare Other | Admitting: Vascular Surgery

## 2021-02-02 ENCOUNTER — Inpatient Hospital Stay (HOSPITAL_COMMUNITY)
Admission: RE | Admit: 2021-02-02 | Discharge: 2021-02-03 | DRG: 460 | Disposition: A | Discharge status: Home / Self Care | Payer: Medicare Other | Attending: Neurological Surgery | Admitting: Neurological Surgery

## 2021-02-02 ENCOUNTER — Encounter (HOSPITAL_COMMUNITY): Payer: Self-pay | Admitting: Neurological Surgery

## 2021-02-02 ENCOUNTER — Other Ambulatory Visit: Payer: Self-pay

## 2021-02-02 ENCOUNTER — Encounter (HOSPITAL_COMMUNITY)
Admission: RE | Disposition: A | Discharge status: Home / Self Care | Payer: Self-pay | Source: Home / Self Care | Attending: Neurological Surgery

## 2021-02-02 ENCOUNTER — Inpatient Hospital Stay (HOSPITAL_COMMUNITY): Payer: Medicare Other | Admitting: Anesthesiology

## 2021-02-02 ENCOUNTER — Inpatient Hospital Stay (HOSPITAL_COMMUNITY): Payer: Medicare Other

## 2021-02-02 DIAGNOSIS — Z96652 Presence of left artificial knee joint: Secondary | ICD-10-CM | POA: Diagnosis present

## 2021-02-02 DIAGNOSIS — I10 Essential (primary) hypertension: Secondary | ICD-10-CM | POA: Diagnosis present

## 2021-02-02 DIAGNOSIS — M4316 Spondylolisthesis, lumbar region: Secondary | ICD-10-CM | POA: Diagnosis present

## 2021-02-02 DIAGNOSIS — Z20822 Contact with and (suspected) exposure to covid-19: Secondary | ICD-10-CM | POA: Diagnosis present

## 2021-02-02 DIAGNOSIS — R7303 Prediabetes: Secondary | ICD-10-CM | POA: Diagnosis present

## 2021-02-02 DIAGNOSIS — Z79899 Other long term (current) drug therapy: Secondary | ICD-10-CM | POA: Diagnosis not present

## 2021-02-02 DIAGNOSIS — K219 Gastro-esophageal reflux disease without esophagitis: Secondary | ICD-10-CM | POA: Diagnosis present

## 2021-02-02 DIAGNOSIS — Z8249 Family history of ischemic heart disease and other diseases of the circulatory system: Secondary | ICD-10-CM

## 2021-02-02 DIAGNOSIS — E669 Obesity, unspecified: Secondary | ICD-10-CM | POA: Diagnosis present

## 2021-02-02 DIAGNOSIS — Z6838 Body mass index (BMI) 38.0-38.9, adult: Secondary | ICD-10-CM

## 2021-02-02 DIAGNOSIS — E785 Hyperlipidemia, unspecified: Secondary | ICD-10-CM | POA: Diagnosis present

## 2021-02-02 DIAGNOSIS — Z806 Family history of leukemia: Secondary | ICD-10-CM | POA: Diagnosis not present

## 2021-02-02 DIAGNOSIS — Z823 Family history of stroke: Secondary | ICD-10-CM

## 2021-02-02 DIAGNOSIS — Z880 Allergy status to penicillin: Secondary | ICD-10-CM | POA: Diagnosis not present

## 2021-02-02 DIAGNOSIS — M48062 Spinal stenosis, lumbar region with neurogenic claudication: Secondary | ICD-10-CM | POA: Diagnosis present

## 2021-02-02 DIAGNOSIS — Z419 Encounter for procedure for purposes other than remedying health state, unspecified: Secondary | ICD-10-CM

## 2021-02-02 DIAGNOSIS — E039 Hypothyroidism, unspecified: Secondary | ICD-10-CM | POA: Diagnosis present

## 2021-02-02 DIAGNOSIS — Z90710 Acquired absence of both cervix and uterus: Secondary | ICD-10-CM | POA: Diagnosis not present

## 2021-02-02 DIAGNOSIS — Z801 Family history of malignant neoplasm of trachea, bronchus and lung: Secondary | ICD-10-CM

## 2021-02-02 DIAGNOSIS — Z79890 Hormone replacement therapy (postmenopausal): Secondary | ICD-10-CM

## 2021-02-02 DIAGNOSIS — M5416 Radiculopathy, lumbar region: Secondary | ICD-10-CM | POA: Diagnosis present

## 2021-02-02 DIAGNOSIS — K760 Fatty (change of) liver, not elsewhere classified: Secondary | ICD-10-CM | POA: Diagnosis present

## 2021-02-02 DIAGNOSIS — Z833 Family history of diabetes mellitus: Secondary | ICD-10-CM | POA: Diagnosis not present

## 2021-02-02 HISTORY — PX: TRANSFORAMINAL LUMBAR INTERBODY FUSION W/ MIS 1 LEVEL: SHX6145

## 2021-02-02 LAB — GLUCOSE, CAPILLARY: Glucose-Capillary: 112 mg/dL — ABNORMAL HIGH (ref 70–99)

## 2021-02-02 SURGERY — MINIMALLY INVASIVE (MIS) TRANSFORAMINAL LUMBAR INTERBODY FUSION (TLIF) 1 LEVEL
Anesthesia: General | Site: Back

## 2021-02-02 MED ORDER — ACETAMINOPHEN 10 MG/ML IV SOLN
INTRAVENOUS | Status: AC
  Filled 2021-02-02: qty 100

## 2021-02-02 MED ORDER — PHENOL 1.4 % MT LIQD: 1.0000 | OROMUCOSAL | Status: DC | PRN

## 2021-02-02 MED ORDER — DOCUSATE SODIUM 100 MG PO CAPS
100.0000 mg | ORAL_CAPSULE | Freq: Two times a day (BID) | ORAL | Status: DC
  Administered 2021-02-02 – 2021-02-03 (×2): 100 mg via ORAL
  Filled 2021-02-02 (×2): qty 1

## 2021-02-02 MED ORDER — HYDROMORPHONE HCL 1 MG/ML IJ SOLN
INTRAMUSCULAR | Status: AC
  Filled 2021-02-02: qty 1

## 2021-02-02 MED ORDER — CEFAZOLIN SODIUM-DEXTROSE 2-4 GM/100ML-% IV SOLN
2.0000 g | Freq: Three times a day (TID) | INTRAVENOUS | Status: AC
  Administered 2021-02-02 – 2021-02-03 (×2): 2 g via INTRAVENOUS
  Filled 2021-02-02 (×2): qty 100

## 2021-02-02 MED ORDER — ROSUVASTATIN CALCIUM 20 MG PO TABS
20.0000 mg | ORAL_TABLET | Freq: Every day | ORAL | Status: DC
Start: 2021-02-02 — End: 2021-02-03
  Administered 2021-02-02 – 2021-02-03 (×2): 20 mg via ORAL
  Filled 2021-02-02 (×2): qty 1

## 2021-02-02 MED ORDER — HYDROCODONE-ACETAMINOPHEN 7.5-325 MG PO TABS
ORAL_TABLET | ORAL | Status: AC
  Filled 2021-02-02: qty 1

## 2021-02-02 MED ORDER — SCOPOLAMINE 1 MG/3DAYS TD PT72
MEDICATED_PATCH | TRANSDERMAL | Status: DC | PRN
  Administered 2021-02-02: 1 via TRANSDERMAL

## 2021-02-02 MED ORDER — POLYETHYLENE GLYCOL 3350 17 G PO PACK: 17.0000 g | PACK | Freq: Every day | ORAL | Status: DC | PRN

## 2021-02-02 MED ORDER — SODIUM CHLORIDE 0.9% FLUSH: 3.0000 mL | INTRAVENOUS | Status: DC | PRN

## 2021-02-02 MED ORDER — GABAPENTIN 100 MG PO CAPS
100.0000 mg | ORAL_CAPSULE | Freq: Every day | ORAL | Status: DC
  Administered 2021-02-02 – 2021-02-03 (×2): 100 mg via ORAL
  Filled 2021-02-02 (×2): qty 1

## 2021-02-02 MED ORDER — ACETAMINOPHEN 325 MG PO TABS: 650.0000 mg | ORAL_TABLET | ORAL | Status: DC | PRN

## 2021-02-02 MED ORDER — MIDAZOLAM HCL 2 MG/2ML IJ SOLN
INTRAMUSCULAR | Status: AC
  Filled 2021-02-02: qty 2

## 2021-02-02 MED ORDER — LOSARTAN POTASSIUM 50 MG PO TABS
100.0000 mg | ORAL_TABLET | Freq: Every day | ORAL | Status: DC
  Administered 2021-02-02 – 2021-02-03 (×2): 100 mg via ORAL
  Filled 2021-02-02 (×2): qty 2

## 2021-02-02 MED ORDER — FENTANYL CITRATE (PF) 250 MCG/5ML IJ SOLN
INTRAMUSCULAR | Status: DC | PRN
  Administered 2021-02-02 (×2): 50 ug via INTRAVENOUS
  Administered 2021-02-02: 100 ug via INTRAVENOUS
  Administered 2021-02-02: 50 ug via INTRAVENOUS

## 2021-02-02 MED ORDER — DEXAMETHASONE SODIUM PHOSPHATE 10 MG/ML IJ SOLN
INTRAMUSCULAR | Status: DC | PRN
  Administered 2021-02-02: 10 mg via INTRAVENOUS

## 2021-02-02 MED ORDER — FINASTERIDE 5 MG PO TABS
5.0000 mg | ORAL_TABLET | Freq: Every day | ORAL | Status: DC
  Administered 2021-02-02 – 2021-02-03 (×2): 5 mg via ORAL
  Filled 2021-02-02 (×2): qty 1

## 2021-02-02 MED ORDER — KETAMINE HCL 50 MG/5ML IJ SOSY
PREFILLED_SYRINGE | INTRAMUSCULAR | Status: AC
  Filled 2021-02-02: qty 5

## 2021-02-02 MED ORDER — POLYVINYL ALCOHOL 1.4 % OP SOLN: 1.0000 [drp] | Freq: Three times a day (TID) | OPHTHALMIC | Status: DC | PRN

## 2021-02-02 MED ORDER — THROMBIN 5000 UNITS EX SOLR: OROMUCOSAL | Status: DC | PRN

## 2021-02-02 MED ORDER — 0.9 % SODIUM CHLORIDE (POUR BTL) OPTIME
TOPICAL | Status: DC | PRN
  Administered 2021-02-02: 1000 mL

## 2021-02-02 MED ORDER — SUGAMMADEX SODIUM 200 MG/2ML IV SOLN
INTRAVENOUS | Status: DC | PRN
Start: 2021-02-02 — End: 2021-02-02
  Administered 2021-02-02: 220 mg via INTRAVENOUS

## 2021-02-02 MED ORDER — LACTATED RINGERS IV SOLN: INTRAVENOUS | Status: DC

## 2021-02-02 MED ORDER — PROPOFOL 10 MG/ML IV BOLUS
INTRAVENOUS | Status: AC
  Filled 2021-02-02: qty 20

## 2021-02-02 MED ORDER — CHLORHEXIDINE GLUCONATE CLOTH 2 % EX PADS: 6.0000 | MEDICATED_PAD | Freq: Once | CUTANEOUS | Status: DC

## 2021-02-02 MED ORDER — ONDANSETRON HCL 4 MG/2ML IJ SOLN
INTRAMUSCULAR | Status: AC
  Filled 2021-02-02: qty 2

## 2021-02-02 MED ORDER — ONDANSETRON HCL 4 MG PO TABS: 4.0000 mg | ORAL_TABLET | Freq: Four times a day (QID) | ORAL | Status: DC | PRN

## 2021-02-02 MED ORDER — FENTANYL CITRATE (PF) 250 MCG/5ML IJ SOLN
INTRAMUSCULAR | Status: AC
  Filled 2021-02-02: qty 5

## 2021-02-02 MED ORDER — POLYVINYL ALCOHOL-POVIDONE PF 1.4-0.6 % OP SOLN: 1.0000 [drp] | Freq: Three times a day (TID) | OPHTHALMIC | Status: DC | PRN

## 2021-02-02 MED ORDER — LIDOCAINE-EPINEPHRINE 1 %-1:100000 IJ SOLN
INTRAMUSCULAR | Status: AC
  Filled 2021-02-02: qty 1

## 2021-02-02 MED ORDER — OXYCODONE HCL 5 MG PO TABS: 10.0000 mg | ORAL_TABLET | ORAL | Status: DC | PRN

## 2021-02-02 MED ORDER — VANCOMYCIN HCL IN DEXTROSE 1-5 GM/200ML-% IV SOLN: 1000.0000 mg | INTRAVENOUS | Status: AC

## 2021-02-02 MED ORDER — SODIUM CHLORIDE 0.9% FLUSH: 3.0000 mL | Freq: Two times a day (BID) | INTRAVENOUS | Status: DC

## 2021-02-02 MED ORDER — VANCOMYCIN HCL IN DEXTROSE 1-5 GM/200ML-% IV SOLN
INTRAVENOUS | Status: AC
  Administered 2021-02-02: 1000 mg via INTRAVENOUS
  Filled 2021-02-02: qty 200

## 2021-02-02 MED ORDER — HYDROMORPHONE HCL 1 MG/ML IJ SOLN
0.2500 mg | INTRAMUSCULAR | Status: DC | PRN
  Administered 2021-02-02 (×4): 0.5 mg via INTRAVENOUS

## 2021-02-02 MED ORDER — CHLORHEXIDINE GLUCONATE 0.12 % MT SOLN
OROMUCOSAL | Status: AC
  Administered 2021-02-02: 15 mL via OROMUCOSAL
  Filled 2021-02-02: qty 15

## 2021-02-02 MED ORDER — PROPOFOL 500 MG/50ML IV EMUL
INTRAVENOUS | Status: DC | PRN
  Administered 2021-02-02: 20 ug/kg/min via INTRAVENOUS

## 2021-02-02 MED ORDER — KETAMINE HCL 10 MG/ML IJ SOLN
INTRAMUSCULAR | Status: DC | PRN
Start: 2021-02-02 — End: 2021-02-02
  Administered 2021-02-02: 30 mg via INTRAVENOUS
  Administered 2021-02-02: 20 mg via INTRAVENOUS

## 2021-02-02 MED ORDER — MENTHOL 3 MG MT LOZG: 1.0000 | LOZENGE | OROMUCOSAL | Status: DC | PRN

## 2021-02-02 MED ORDER — ONDANSETRON HCL 4 MG/2ML IJ SOLN: 4.0000 mg | Freq: Four times a day (QID) | INTRAMUSCULAR | Status: DC | PRN

## 2021-02-02 MED ORDER — THROMBIN 5000 UNITS EX SOLR
CUTANEOUS | Status: AC
  Filled 2021-02-02: qty 5000

## 2021-02-02 MED ORDER — ROCURONIUM BROMIDE 10 MG/ML (PF) SYRINGE
PREFILLED_SYRINGE | INTRAVENOUS | Status: DC | PRN
Start: 2021-02-02 — End: 2021-02-02
  Administered 2021-02-02: 100 mg via INTRAVENOUS
  Administered 2021-02-02 (×2): 20 mg via INTRAVENOUS

## 2021-02-02 MED ORDER — VITAMIN B-12 1000 MCG PO TABS
1000.0000 ug | ORAL_TABLET | Freq: Every day | ORAL | Status: DC
  Administered 2021-02-02 – 2021-02-03 (×2): 1000 ug via ORAL
  Filled 2021-02-02 (×2): qty 1

## 2021-02-02 MED ORDER — MIDAZOLAM HCL 5 MG/5ML IJ SOLN
INTRAMUSCULAR | Status: DC | PRN
  Administered 2021-02-02: 2 mg via INTRAVENOUS

## 2021-02-02 MED ORDER — LIDOCAINE-EPINEPHRINE 1 %-1:100000 IJ SOLN
INTRAMUSCULAR | Status: DC | PRN
  Administered 2021-02-02: 10 mL

## 2021-02-02 MED ORDER — ONDANSETRON HCL 4 MG/2ML IJ SOLN
INTRAMUSCULAR | Status: DC | PRN
Start: 2021-02-02 — End: 2021-02-02
  Administered 2021-02-02: 4 mg via INTRAVENOUS

## 2021-02-02 MED ORDER — CYCLOBENZAPRINE HCL 10 MG PO TABS
10.0000 mg | ORAL_TABLET | Freq: Three times a day (TID) | ORAL | Status: DC | PRN
  Administered 2021-02-03: 10 mg via ORAL
  Filled 2021-02-02: qty 1

## 2021-02-02 MED ORDER — METRONIDAZOLE 0.75 % EX GEL
1.0000 "application " | Freq: Every day | CUTANEOUS | Status: DC
  Filled 2021-02-02: qty 45

## 2021-02-02 MED ORDER — PHENYLEPHRINE HCL-NACL 20-0.9 MG/250ML-% IV SOLN
INTRAVENOUS | Status: DC | PRN
  Administered 2021-02-02: 20 ug/min via INTRAVENOUS

## 2021-02-02 MED ORDER — HYDROCODONE-ACETAMINOPHEN 7.5-325 MG PO TABS
1.0000 | ORAL_TABLET | Freq: Once | ORAL | Status: AC | PRN
  Administered 2021-02-02: 1 via ORAL

## 2021-02-02 MED ORDER — HYDROMORPHONE HCL 1 MG/ML IJ SOLN
1.0000 mg | INTRAMUSCULAR | Status: DC | PRN
  Administered 2021-02-03: 1 mg via INTRAVENOUS
  Filled 2021-02-02: qty 1

## 2021-02-02 MED ORDER — HYDROCODONE-ACETAMINOPHEN 5-325 MG PO TABS
1.0000 | ORAL_TABLET | ORAL | Status: DC | PRN
  Administered 2021-02-02 – 2021-02-03 (×4): 2 via ORAL
  Filled 2021-02-02 (×4): qty 2

## 2021-02-02 MED ORDER — SERTRALINE HCL 50 MG PO TABS
50.0000 mg | ORAL_TABLET | Freq: Every day | ORAL | Status: DC
  Administered 2021-02-02 – 2021-02-03 (×2): 50 mg via ORAL
  Filled 2021-02-02 (×2): qty 1

## 2021-02-02 MED ORDER — PROPOFOL 10 MG/ML IV BOLUS
INTRAVENOUS | Status: DC | PRN
Start: 2021-02-02 — End: 2021-02-02
  Administered 2021-02-02: 200 mg via INTRAVENOUS

## 2021-02-02 MED ORDER — FLUTICASONE PROPIONATE 50 MCG/ACT NA SUSP: 2.0000 | Freq: Every day | NASAL | Status: DC | PRN

## 2021-02-02 MED ORDER — LEVOTHYROXINE SODIUM 112 MCG PO TABS
112.0000 ug | ORAL_TABLET | Freq: Every day | ORAL | Status: DC
  Administered 2021-02-03: 112 ug via ORAL
  Filled 2021-02-02: qty 1

## 2021-02-02 MED ORDER — FERROUS SULFATE 325 (65 FE) MG PO TABS
325.0000 mg | ORAL_TABLET | Freq: Every day | ORAL | Status: DC
  Administered 2021-02-03: 325 mg via ORAL
  Filled 2021-02-02: qty 1

## 2021-02-02 MED ORDER — LIDOCAINE 2% (20 MG/ML) 5 ML SYRINGE
INTRAMUSCULAR | Status: DC | PRN
  Administered 2021-02-02: 60 mg via INTRAVENOUS

## 2021-02-02 MED ORDER — ORAL CARE MOUTH RINSE: 15.0000 mL | Freq: Once | OROMUCOSAL | Status: AC

## 2021-02-02 MED ORDER — SODIUM CHLORIDE 0.9 % IV SOLN: 250.0000 mL | INTRAVENOUS | Status: DC

## 2021-02-02 MED ORDER — ACETAMINOPHEN 650 MG RE SUPP: 650.0000 mg | RECTAL | Status: DC | PRN

## 2021-02-02 MED ORDER — OXYBUTYNIN CHLORIDE ER 5 MG PO TB24
5.0000 mg | ORAL_TABLET | Freq: Every day | ORAL | Status: DC
  Administered 2021-02-02: 5 mg via ORAL
  Filled 2021-02-02: qty 1

## 2021-02-02 MED ORDER — GLYCOPYRROLATE PF 0.2 MG/ML IJ SOSY
PREFILLED_SYRINGE | INTRAMUSCULAR | Status: DC | PRN
  Administered 2021-02-02 (×2): .1 mg via INTRAVENOUS

## 2021-02-02 MED ORDER — ACETAMINOPHEN 500 MG PO TABS
1000.0000 mg | ORAL_TABLET | Freq: Once | ORAL | Status: AC
  Administered 2021-02-02: 1000 mg via ORAL
  Filled 2021-02-02: qty 2

## 2021-02-02 MED ORDER — CHLORHEXIDINE GLUCONATE 0.12 % MT SOLN: 15.0000 mL | Freq: Once | OROMUCOSAL | Status: AC

## 2021-02-02 MED ORDER — SCOPOLAMINE 1 MG/3DAYS TD PT72
MEDICATED_PATCH | TRANSDERMAL | Status: AC
  Filled 2021-02-02: qty 1

## 2021-02-02 MED ORDER — ONDANSETRON HCL 4 MG/2ML IJ SOLN: 4.0000 mg | Freq: Once | INTRAMUSCULAR | Status: DC | PRN

## 2021-02-02 MED ORDER — OXYCODONE HCL 5 MG PO TABS: 5.0000 mg | ORAL_TABLET | ORAL | Status: DC | PRN

## 2021-02-02 SURGICAL SUPPLY — 63 items
BAG COUNTER SPONGE SURGICOUNT (BAG) ×2 IMPLANT
BAND RUBBER #18 3X1/16 STRL (MISCELLANEOUS) ×4 IMPLANT
BASKET BONE COLLECTION (BASKET) ×2 IMPLANT
BLADE CLIPPER SURG (BLADE) IMPLANT
BLADE SURG 11 STRL SS (BLADE) ×2 IMPLANT
BUR MATCHSTICK NEURO 3.0 LAGG (BURR) IMPLANT
BUR PRECISION MATCH 3.0 13 (BURR) IMPLANT
BUR ROUND PRECISION 4.0 (BURR) ×2 IMPLANT
CAGE EXP CATALYFT 9 (Plate) ×1 IMPLANT
CNTNR URN SCR LID CUP LEK RST (MISCELLANEOUS) ×1 IMPLANT
CONT SPEC 4OZ STRL OR WHT (MISCELLANEOUS) ×2
COVER BACK TABLE 60X90IN (DRAPES) ×2 IMPLANT
DECANTER SPIKE VIAL GLASS SM (MISCELLANEOUS) ×1 IMPLANT
DERMABOND ADVANCED (GAUZE/BANDAGES/DRESSINGS) ×1
DERMABOND ADVANCED .7 DNX12 (GAUZE/BANDAGES/DRESSINGS) ×1 IMPLANT
DRAPE C-ARM 42X72 X-RAY (DRAPES) ×2 IMPLANT
DRAPE C-ARMOR (DRAPES) ×2 IMPLANT
DRAPE LAPAROTOMY 100X72X124 (DRAPES) ×2 IMPLANT
DRAPE MICROSCOPE LEICA (MISCELLANEOUS) ×2 IMPLANT
DRAPE SURG 17X23 STRL (DRAPES) ×2 IMPLANT
ELECT BLADE 6.5 EXT (BLADE) ×2 IMPLANT
ELECT REM PT RETURN 9FT ADLT (ELECTROSURGICAL) ×2
ELECTRODE REM PT RTRN 9FT ADLT (ELECTROSURGICAL) ×1 IMPLANT
EXTENDER TAB GUIDE SV 5.5/6.0 (INSTRUMENTS) ×8 IMPLANT
GAUZE 4X4 16PLY ~~LOC~~+RFID DBL (SPONGE) ×1 IMPLANT
GAUZE SPONGE 4X4 12PLY STRL (GAUZE/BANDAGES/DRESSINGS) ×1 IMPLANT
GLOVE SURG LTX SZ7.5 (GLOVE) ×2 IMPLANT
GLOVE SURG UNDER POLY LF SZ7.5 (GLOVE) ×2 IMPLANT
GOWN STRL REUS W/ TWL LRG LVL3 (GOWN DISPOSABLE) ×1 IMPLANT
GOWN STRL REUS W/ TWL XL LVL3 (GOWN DISPOSABLE) IMPLANT
GOWN STRL REUS W/TWL 2XL LVL3 (GOWN DISPOSABLE) IMPLANT
GOWN STRL REUS W/TWL LRG LVL3 (GOWN DISPOSABLE) ×8
GOWN STRL REUS W/TWL XL LVL3 (GOWN DISPOSABLE)
GUIDEWIRE BLUNT NT 450 (WIRE) ×4 IMPLANT
HEMOSTAT POWDER KIT SURGIFOAM (HEMOSTASIS) ×2 IMPLANT
KIT BASIN OR (CUSTOM PROCEDURE TRAY) ×2 IMPLANT
KIT POSITION SURG JACKSON T1 (MISCELLANEOUS) ×2 IMPLANT
KIT TURNOVER KIT B (KITS) ×1 IMPLANT
NDL BEVEL TWO-PAK W/1PK (NEEDLE) IMPLANT
NDL HYPO 18GX1.5 BLUNT FILL (NEEDLE) IMPLANT
NDL SPNL 18GX3.5 QUINCKE PK (NEEDLE) IMPLANT
NEEDLE BEVEL TWO-PAK W/1PK (NEEDLE) ×2 IMPLANT
NEEDLE HYPO 18GX1.5 BLUNT FILL (NEEDLE) IMPLANT
NEEDLE HYPO 22GX1.5 SAFETY (NEEDLE) ×2 IMPLANT
NEEDLE SPNL 18GX3.5 QUINCKE PK (NEEDLE) ×2 IMPLANT
NS IRRIG 1000ML POUR BTL (IV SOLUTION) ×2 IMPLANT
PACK LAMINECTOMY NEURO (CUSTOM PROCEDURE TRAY) ×2 IMPLANT
PAD ARMBOARD 7.5X6 YLW CONV (MISCELLANEOUS) ×4 IMPLANT
ROD 5.5X45MM SOLERA VOYAGER (Rod) ×1 IMPLANT
ROD PERC CCM 5.5X35 (Rod) ×1 IMPLANT
SCREW 5.5X40MM SOLERA VOYAGER (Screw) ×2 IMPLANT
SCREW MAS VOYAGER 6.5X40 (Screw) ×2 IMPLANT
SCREW SET 5.5/6.0MM SOLERA (Screw) ×4 IMPLANT
SPONGE T-LAP 4X18 ~~LOC~~+RFID (SPONGE) ×1 IMPLANT
SUT MNCRL AB 3-0 PS2 18 (SUTURE) ×2 IMPLANT
SUT VIC AB 0 CT1 18XCR BRD8 (SUTURE) IMPLANT
SUT VIC AB 0 CT1 8-18 (SUTURE)
SUT VIC AB 2-0 CP2 18 (SUTURE) ×3 IMPLANT
SYR 3ML LL SCALE MARK (SYRINGE) IMPLANT
TOWEL GREEN STERILE (TOWEL DISPOSABLE) ×2 IMPLANT
TOWEL GREEN STERILE FF (TOWEL DISPOSABLE) ×2 IMPLANT
TRAY FOLEY MTR SLVR 16FR STAT (SET/KITS/TRAYS/PACK) ×1 IMPLANT
WATER STERILE IRR 1000ML POUR (IV SOLUTION) ×2 IMPLANT

## 2021-02-02 NOTE — H&P (Signed)
Surgical H&P Update  HPI: 66 y.o. woman with a h/o back and BLE pain, workup showed a mobile spondylolisthesis with canal stenosis at L3-4. No changes in health since she was last seen. Still having pain and wishes to proceed with surgery.  PMHx:  Past Medical History:  Diagnosis Date   Arthritis    Cholelithiasis    Dysrhythmia    Heart murmur    Hepatic steatosis    History of prediabetes    Hyperlipemia    Hypertension    Hypothyroidism    PONV (postoperative nausea and vomiting)    Pre-diabetes    FamHx:  Family History  Problem Relation Age of Onset   Diabetes Mother    Leukemia Mother    Alcohol abuse Father    Lung cancer Father        smoker   Diabetes Brother    Heart attack Maternal Uncle        X3 ; MIs in 48s & 80s   Stroke Maternal Uncle        in his 56s   Adrenal disorder Neg Hx    SocHx:  reports that she has never smoked. She has never used smokeless tobacco. She reports current alcohol use. She reports that she does not use drugs.  Physical Exam: Strength 5/5 x4, SILTx4  Assesment/Plan: 66 y.o. woman with L3-4 degenerative spondylolisthesis and neurogenic claudication 2/2 lumbar spinal stenosis, here for L3-4 MIS decompression and TLIF. Risks, benefits, and alternatives discussed and the patient would like to continue with surgery.  -OR today -3C post-op  [name], MD 02/02/21 1:21 PM

## 2021-02-02 NOTE — Op Note (Signed)
PATIENT: [name] [name]  DAY OF SURGERY: 02/02/21   PRE-OPERATIVE DIAGNOSIS:  Lumbar stenosis with neurogenic claudication, lumbar spondylolisthesis   POST-OPERATIVE DIAGNOSIS:  Same   PROCEDURE:  L3-L4 minimally invasive transforaminal lumbar interbody fusion with bilateral L3-L4 pedicle screw placement   SURGEON:  Surgeon(s) and Role:    [name] Part, MD - Primary   ANESTHESIA: ETGA   BRIEF HISTORY: This is a 66 year old woman who presented with bilateral lower extremity radicular pain and low back pain. The patient was found to have a mobile spondylolisthesis with severe canal stenosis. I therefore recommended MIS decompression and TLIF at L3-4. This was discussed with the patient as well as risks, benefits, and alternatives and wished to proceed with surgery.   OPERATIVE DETAIL:  The patient was taken to the operating room and placed on the OR table in the prone position. A formal time out was performed with two patient identifiers and confirmed the operative site. Anesthesia was induced by the anesthesia team. The operative site was marked, hair was clipped with surgical clippers, the area was then prepped and draped in a sterile fashion.   Fluoroscopy was used to localize the surgical level. The pedicles were marked and used to create skin incisions bilaterally. With fluoro guidance, Jamshidi needles were used to guide K-wires into the bilateral L3 and L4 pedicles. The K wires were then secured with hemostats and attention turned to the TLIF.  A [employer] tube was then docked to the left L3-4 facet through the same incision using fluoroscopy. A left L3-4 facetectomy was performed and the left L3 nerve root was decompressed along its entire course. The tube was wanded medially and the decompression was continued medially until reaching the contralateral foramen. An L3-4 laminectomy was completed, which was obviously more than what was needed for instrumentation alone. The tube was  wanded back to the disc space. The disc space was identified, incised, and a discectomy was performed in the standard fashion. The endplates were prepped, bone graft was packed into the disc space, and an expandable cage ([employer]) was packed with autograft and placed into the disc space with fluoroscopic confirmation. The tube was removed and hemostasis was obtained during its removal.   Using the previously placed K wires, a tap and then screw with tower were placed bilaterally at L3 and L4. A rod was sized and introduced on both sides, confirmed with fluoroscopy, then final tightened. Hemostasis was again confirmed for both incisions, they were copiously irrigated, and then closed in layers.    EBL:  134mL   DRAINS: none   SPECIMENS: none   [name] Part, MD 02/02/21 1:55 PM

## 2021-02-02 NOTE — Anesthesia Procedure Notes (Signed)
Procedure Name: Intubation Date/Time: 02/02/2021 1:44 PM Performed by: Oletta Lamas, CRNA Pre-anesthesia Checklist: Patient identified, Emergency Drugs available, Suction available and Patient being monitored Patient Re-evaluated:Patient Re-evaluated prior to induction Oxygen Delivery Method: Circle system utilized Preoxygenation: Pre-oxygenation with 100% oxygen Induction Type: IV induction Ventilation: Mask ventilation without difficulty Laryngoscope Size: Mac and 3 Grade View: Grade II Tube type: Oral Tube size: 7.0 mm Number of attempts: 2 Airway Equipment and Method: Stylet and Oral airway Placement Confirmation: ETT inserted through vocal cords under direct vision, positive ETCO2 and breath sounds checked- equal and bilateral Tube secured with: Tape Dental Injury: Teeth and Oropharynx as per pre-operative assessment  Comments: Grade 3 with miller, grade 2b with mac and significant cricoid pressure- recc glidescope - FinucaneDO

## 2021-02-02 NOTE — Transfer of Care (Signed)
Immediate Anesthesia Transfer of Care Note  Patient: [name] [name]  Procedure(s) Performed: Lumbar three-four Minimally Invasive Transforaminal Lumbar Interbody Fusion (Back)  Patient Location: PACU  Anesthesia Type:General  Level of Consciousness: drowsy  Airway & Oxygen Therapy: Patient Spontanous Breathing and Patient connected to face mask oxygen  Post-op Assessment: Report given to RN and Post -op Vital signs reviewed and stable  Post vital signs: Reviewed and stable  Last Vitals:  Vitals Value Taken Time  BP 121/84 02/02/21 1743  Temp    Pulse 90 02/02/21 1745  Resp 14 02/02/21 1747  SpO2 100 % 02/02/21 1745  Vitals shown include unvalidated device data.  Last Pain:  Vitals:   02/02/21 1048  TempSrc:   PainSc: 0-No pain         Complications: No notable events documented.

## 2021-02-03 MED ORDER — CYCLOBENZAPRINE HCL 10 MG PO TABS: 10.0000 mg | ORAL_TABLET | Freq: Three times a day (TID) | ORAL | 0 refills | Status: DC | PRN

## 2021-02-03 MED ORDER — ALUM & MAG HYDROXIDE-SIMETH 200-200-20 MG/5ML PO SUSP: 15.0000 mL | ORAL | Status: DC | PRN

## 2021-02-03 MED ORDER — HYDROCODONE-ACETAMINOPHEN 5-325 MG PO TABS: 1.0000 | ORAL_TABLET | ORAL | 0 refills | Status: DC | PRN

## 2021-02-03 NOTE — Progress Notes (Signed)
Neurosurgery Service Progress Note  Subjective: No acute events overnight, radicular pain completely gone, she's overjoyed   Objective: Vitals:   02/02/21 2012 02/02/21 2300 02/03/21 0429 02/03/21 0807  BP: 132/71 (!) 146/82 117/63 116/82  Pulse: 91 100 94 98  Resp: 18 18 18 16   Temp: 97.9 F (36.6 C) 97.7 F (36.5 C) 99.9 F (37.7 C) 98.9 F (37.2 C)  TempSrc: Oral Oral Oral Oral  SpO2: 98% 95% 94% 94%  Weight:      Height:        Physical Exam: Strength 5/5 x4, SILTx4  Assessment & Plan: 66 y.o. woman s/p MIS TLIF, recovering well.  -discharge home today  [name]  02/03/21 9:55 AM

## 2021-02-03 NOTE — Progress Notes (Signed)
PT Cancellation Note and Discharge  Patient Details Name: [name] [name] MRN: [id number] DOB: 04-14-1955   Cancelled Treatment:    Reason Eval/Treat Not Completed: PT screened, no needs identified, will sign off.   Discussed pt case with OT who reports pt is independent with functional mobility and has completed education. PT will sign off at this time. If needs change, please reconsult.    Thelma Comp 02/03/2021, 8:34 AM  Rolinda Roan, PT, DPT Acute Rehabilitation Services Pager: [phone] Office: [phone]

## 2021-02-03 NOTE — Evaluation (Signed)
Occupational Therapy Evaluation Patient Details Name: [name] [name] MRN: [id number] DOB: [birth year] Today's Date: 02/03/2021   History of Present Illness  66 yo female s/p L3-4 fusion on 02/02/21. PMH including arthritis, HTN, and K THA.    Clinical Impression   PTA, pt was living with her husband and was independent. Currently, pt performing ADLs and functional mobility at Mod I - independent level. Provided education and handout on back precautions, bed mobility, grooming, LB ADLs, toileting, shower transfer, and stair management; pt demonstrated understanding. Answered all pt questions. Recommend dc home once medically stable per physician. All acute OT needs met and will sign off. Thank you.      Recommendations for follow up therapy are one component of a multi-disciplinary discharge planning process, led by the attending physician.  Recommendations may be updated based on patient status, additional functional criteria and insurance authorization.   Follow Up Recommendations  No OT follow up    Assistance Recommended at Discharge None  Patient can return home with the following      Functional Status Assessment  Patient has had a recent decline in their functional status and demonstrates the ability to make significant improvements in function in a reasonable and predictable amount of time.  Equipment Recommendations  None recommended by OT    Recommendations for Other Services       Precautions / Restrictions Precautions Precautions: Back Precaution Booklet Issued: Yes (comment) Precaution Comments: Reviewing back precautions Required Braces or Orthoses: Other Brace Other Brace: No brace per MD Restrictions Weight Bearing Restrictions: No      Mobility Bed Mobility Overal bed mobility: Independent             General bed mobility comments: log roll    Transfers Overall transfer level: Independent                        Balance Overall  balance assessment: No apparent balance deficits (not formally assessed)                                         ADL either performed or assessed with clinical judgement   ADL Overall ADL's : Modified independent                                       General ADL Comments: Increased time due to ROM limitations. Providing education on back precautions, bed mobility, grooming, LB ADLs, toielting, shower transfer, and stair training     Vision         Perception     Praxis      Pertinent Vitals/Pain Pain Assessment: Faces Faces Pain Scale: Hurts little more Pain Location: Lower back Pain Descriptors / Indicators: Discomfort;Grimacing Pain Intervention(s): Monitored during session;Repositioned     Hand Dominance Right   Extremity/Trunk Assessment Upper Extremity Assessment Upper Extremity Assessment: Overall WFL for tasks assessed   Lower Extremity Assessment Lower Extremity Assessment: Overall WFL for tasks assessed   Cervical / Trunk Assessment Cervical / Trunk Assessment: Back Surgery   Communication Communication Communication: No difficulties   Cognition Arousal/Alertness: Awake/alert Behavior During Therapy: WFL for tasks assessed/performed Overall Cognitive Status: Within Functional Limits for tasks assessed  General Comments    ° °  °Exercises   °  °Shoulder Instructions    ° ° °Home Living Family/patient expects to be discharged to:: Private residence °Living Arrangements: Spouse/significant other °Available Help at Discharge: Family °Type of Home: House °Home Access: Stairs to enter °Entrance Stairs-Number of Steps: 1 °  °Home Layout: Multi-level;Able to live on main level with bedroom/bathroom °  °  °Bathroom Shower/Tub: Walk-in shower °  °Bathroom Toilet: Standard °  °  °Home Equipment: BSC/3in1;Rolling Walker (2 wheels);Adaptive equipment °Adaptive Equipment: Reacher °  °   ° °  °Prior Functioning/Environment Prior Level of Function : Independent/Modified Independent °  °  °  °  °  °  °  °ADLs Comments: Currently inbetween jobs °  ° °  °  °OT Problem List: Decreased range of motion;Decreased activity tolerance;Decreased knowledge of precautions °  °   °OT Treatment/Interventions:    °  °OT Goals(Current goals can be found in the care plan section) Acute Rehab OT Goals °Patient Stated Goal: Go home °OT Goal Formulation: All assessment and education complete, DC therapy  °OT Frequency:   °  ° °Co-evaluation   °  °  °  °  ° °  °AM-PAC OT "6 Clicks" Daily Activity     °Outcome Measure Help from another person eating meals?: None °Help from another person taking care of personal grooming?: None °Help from another person toileting, which includes using toliet, bedpan, or urinal?: None °Help from another person bathing (including washing, rinsing, drying)?: None °Help from another person to put on and taking off regular upper body clothing?: None °Help from another person to put on and taking off regular lower body clothing?: None °6 Click Score: 24 °  °End of Session Nurse Communication: Mobility status ° °Activity Tolerance: Patient tolerated treatment well °Patient left: in chair;with call bell/phone within reach ° °OT Visit Diagnosis: Other abnormalities of gait and mobility (R26.89);Muscle weakness (generalized) (M62.81)  °              °Time: 0735-0755 °OT Time Calculation (min): 20 min °Charges:  OT General Charges °$OT Visit: 1 Visit °OT Evaluation °$OT Eval Low Complexity: 1 Low ° °  MSOT, OTR/L °Acute Rehab °Pager: [phone] °Office: [phone] ° ° M  °02/03/2021, 8:11 AM °

## 2021-02-03 NOTE — Plan of Care (Signed)
Pt doing well. Pt and husband given D/C instructions with verbal understanding. Rx's were sent to the pharmacy by MD. Pt's incision has a minimal amount of drainage but no sign of infection. Pt's IV was removed prior to D/C. Pt D/C'd home via wheelchair per MD order. Pt is stable @ D/C and has no other needs at this time. Holli Humbles, RN

## 2021-02-03 NOTE — Discharge Summary (Signed)
Discharge Summary  Date of Admission: 02/02/2021  Date of Discharge: 02/03/21  Attending Physician: Emelda Brothers, MD  Hospital Course: Patient was admitted following an uncomplicated A4-4 MIS TLIF. She was recovered in PACU and transferred to [facility]. Her radicular pain was completely resolved immediately post-op, her hospital course was uncomplicated and the patient was discharged home on 02/03/21. She will follow up in clinic with me in 2 weeks.  Neurologic exam at discharge:  Strength 5/5 x4, SILTx4, no drift  Discharge diagnosis: Lumbar spondylolisthesis, lumbar stenosis with neurogenic claudication  [name], MD 02/03/21 9:55 AM

## 2021-02-03 NOTE — Care Management (Signed)
The Transition of Care Department [facility]) has reviewed patient and no TOC needs have been identified at this time. We will continue to monitor patient advancement through interdisciplinary progression rounds. If new patient transition needs arise, please place a TOC consult

## 2021-02-03 NOTE — Anesthesia Postprocedure Evaluation (Signed)
Anesthesia Post Note  Patient: [name] [name]  Procedure(s) Performed: Lumbar three-four Minimally Invasive Transforaminal Lumbar Interbody Fusion (Back)     Patient location during evaluation: PACU Anesthesia Type: General Level of consciousness: awake and alert and oriented Pain management: pain level controlled Vital Signs Assessment: post-procedure vital signs reviewed and stable Respiratory status: spontaneous breathing, nonlabored ventilation and respiratory function stable Cardiovascular status: blood pressure returned to baseline Postop Assessment: no apparent nausea or vomiting Anesthetic complications: no   No notable events documented.            Marthenia Rolling

## 2021-02-04 ENCOUNTER — Encounter (HOSPITAL_COMMUNITY): Payer: Self-pay | Admitting: Neurological Surgery

## 2021-03-02 DIAGNOSIS — M4316 Spondylolisthesis, lumbar region: Secondary | ICD-10-CM | POA: Diagnosis not present

## 2021-03-02 DIAGNOSIS — M7989 Other specified soft tissue disorders: Secondary | ICD-10-CM | POA: Diagnosis not present

## 2021-03-04 ENCOUNTER — Ambulatory Visit (HOSPITAL_COMMUNITY)
Admission: RE | Admit: 2021-03-04 | Discharge: 2021-03-04 | Disposition: A | Discharge status: Home / Self Care | Payer: Medicare Other | Source: Ambulatory Visit | Attending: Neurological Surgery | Admitting: Neurological Surgery

## 2021-03-04 ENCOUNTER — Other Ambulatory Visit: Payer: Self-pay

## 2021-03-04 ENCOUNTER — Other Ambulatory Visit (HOSPITAL_COMMUNITY): Payer: Self-pay | Admitting: Neurological Surgery

## 2021-03-04 DIAGNOSIS — M7989 Other specified soft tissue disorders: Secondary | ICD-10-CM

## 2021-03-14 ENCOUNTER — Other Ambulatory Visit: Payer: Self-pay | Admitting: Internal Medicine

## 2021-03-18 ENCOUNTER — Telehealth (INDEPENDENT_AMBULATORY_CARE_PROVIDER_SITE_OTHER): Payer: Medicare Other | Admitting: Internal Medicine

## 2021-03-18 DIAGNOSIS — R062 Wheezing: Secondary | ICD-10-CM | POA: Diagnosis not present

## 2021-03-18 DIAGNOSIS — E119 Type 2 diabetes mellitus without complications: Secondary | ICD-10-CM | POA: Diagnosis not present

## 2021-03-18 DIAGNOSIS — E559 Vitamin D deficiency, unspecified: Secondary | ICD-10-CM | POA: Diagnosis not present

## 2021-03-18 DIAGNOSIS — R051 Acute cough: Secondary | ICD-10-CM | POA: Diagnosis not present

## 2021-03-18 MED ORDER — METHYLPREDNISOLONE 4 MG PO TBPK: ORAL_TABLET | ORAL | 0 refills | Status: DC

## 2021-03-18 MED ORDER — AZITHROMYCIN 250 MG PO TABS: ORAL_TABLET | ORAL | 1 refills | Status: AC

## 2021-03-18 MED ORDER — BENZONATATE 100 MG PO CAPS: ORAL_CAPSULE | ORAL | 1 refills | Status: DC

## 2021-03-18 NOTE — Patient Instructions (Signed)
Please take all new medication as prescribed 

## 2021-03-19 ENCOUNTER — Encounter: Payer: Self-pay | Admitting: Internal Medicine

## 2021-03-19 DIAGNOSIS — R062 Wheezing: Secondary | ICD-10-CM | POA: Insufficient documentation

## 2021-03-19 NOTE — Progress Notes (Signed)
Patient ID: [name] [name], female   DOB: 01/02/1956, 66 y.o.   MRN: [id number]  Virtual Visit via Video Note  I connected with [name] [name] on Mar 18, 2021 at  3:00 PM EST by a video enabled telemedicine application and verified that I am speaking with the correct person using two identifiers.  Location of all partiicipants today Patient: at home Provider: at office   I discussed the limitations of evaluation and management by telemedicine and the availability of in person appointments. The patient expressed understanding and agreed to proceed.  History of Present Illness: Here with acute onset mild to mod 2-3 days ST, HA, general weakness and malaise, with prod cough greenish sputum, but Pt denies chest pain, increased sob or doe, wheezing, orthopnea, PND, increased LE swelling, palpitations, dizziness or syncope, and mild wheezing onset last pm.  Does have several wks ongoing nasal allergy symptoms with clearish congestion, itch and sneezing, without fever, pain, ST, cough, swelling or wheezing.  COVID neg by home testing yesterday   Pt denies polydipsia, polyuria, or new focal neuro s/s.   Past Medical History:  Diagnosis Date   Arthritis    Cholelithiasis    Dysrhythmia    Heart murmur    Hepatic steatosis    History of prediabetes    Hyperlipemia    Hypertension    Hypothyroidism    PONV (postoperative nausea and vomiting)    Pre-diabetes    Past Surgical History:  Procedure Laterality Date   ABDOMINAL HYSTERECTOMY     @ age 59, due to fibroids    COLONOSCOPY W/ POLYPECTOMY  01/24/2008   Dr.Jacobs, due 2015    DILATION AND CURETTAGE OF UTERUS     JOINT REPLACEMENT     left knee   MOHS SURGERY  01/23/2002   Basal Cell    TONSILLECTOMY     TOTAL KNEE ARTHROPLASTY  09/22/2011   Dr Nicholes Stairs, Left;   TRANSFORAMINAL LUMBAR INTERBODY FUSION W/ MIS 1 LEVEL N/A 02/02/2021   Procedure: Lumbar three-four Minimally Invasive Transforaminal Lumbar Interbody Fusion;   Surgeon: [name] Part, MD;  Location: [place];  Service: Neurosurgery;  Laterality: N/A;    reports that she has never smoked. She has never used smokeless tobacco. She reports current alcohol use. She reports that she does not use drugs. family history includes Alcohol abuse in her father; Diabetes in her brother and mother; Heart attack in her maternal uncle; Leukemia in her mother; Lung cancer in her father; Stroke in her maternal uncle. Allergies  Allergen Reactions   Ampicillin Rash    rash   Doxycycline Other (See Comments)    Joint symptoms w/o rash or fever   Effexor [Venlafaxine] Other (See Comments)    Constipation, elevated BP   Oxycodone Nausea Only   Penicillins Rash   Current Outpatient Medications on File Prior to Visit  Medication Sig Dispense Refill   cholecalciferol (VITAMIN D3) 25 MCG (1000 UNIT) tablet Take 1,000 Units by mouth daily.     cyclobenzaprine (FLEXERIL) 10 MG tablet Take 1 tablet (10 mg total) by mouth 3 (three) times daily as needed for muscle spasms. 30 tablet 0   diclofenac (VOLTAREN) 75 MG EC tablet TAKE ONE TABLET BY MOUTH TWICE A DAY 60 tablet 3   ferrous sulfate 325 (65 FE) MG tablet Take 325 mg by mouth daily.     finasteride (PROSCAR) 5 MG tablet Take 5 mg by mouth daily.     fluticasone (FLONASE) 50 MCG/ACT nasal  spray Place 2 sprays into both nostrils daily. (Patient taking differently: Place 2 sprays into both nostrils daily as needed for allergies.) 16 g 1   gabapentin (NEURONTIN) 100 MG capsule Take 100 mg by mouth daily.     HYDROcodone-acetaminophen (NORCO/VICODIN) 5-325 MG tablet Take 1 tablet by mouth every 4 (four) hours as needed (pain). 30 tablet 0   losartan (COZAAR) 100 MG tablet TAKE ONE TABLET BY MOUTH DAILY 90 tablet 1   metroNIDAZOLE (METROGEL) 1 % gel Apply 1 application topically in the morning and at bedtime.     Multiple Vitamins-Minerals (HAIR/SKIN/NAILS/BIOTIN PO) Take 1 tablet by mouth daily.     oxybutynin  (DITROPAN-XL) 5 MG 24 hr tablet TAKE ONE TABLET BY MOUTH EVERY NIGHT AT BEDTIME 90 tablet 0   Polyvinyl Alcohol-Povidone PF (REFRESH) 1.4-0.6 % SOLN Place 1 drop into both eyes 3 (three) times daily as needed (dry eyes).     rosuvastatin (CRESTOR) 20 MG tablet TAKE ONE TABLET BY MOUTH DAILY **MUST CALL MD FOR APPOINTMENT 90 tablet 2   sertraline (ZOLOFT) 100 MG tablet TAKE 1 TABLET BY MOUTH DAILY 90 tablet 0   sertraline (ZOLOFT) 50 MG tablet Take 1 tablet (50 mg total) by mouth daily. 90 tablet 1   SYNTHROID 112 MCG tablet TAKE ONE TABLET BY MOUTH DAILY 90 tablet 1   vitamin B-12 (CYANOCOBALAMIN) 1000 MCG tablet Take 1,000 mcg by mouth daily.     No current facility-administered medications on file prior to visit.    Observations/Objective: Alert, NAD, appropriate mood and affect, resps normal, cn 2-12 intact, moves all 4s, no visible rash or swelling Lab Results  Component Value Date   WBC 6.4 01/31/2021   HGB 15.5 (H) 01/31/2021   HCT 47.5 (H) 01/31/2021   PLT 354 01/31/2021   GLUCOSE 115 (H) 01/31/2021   CHOL 166 09/30/2020   TRIG 200.0 (H) 09/30/2020   HDL 37.40 (L) 09/30/2020   LDLDIRECT 90.0 02/03/2020   LDLCALC 88 09/30/2020   ALT 26 09/30/2020   AST 17 09/30/2020   NA 139 01/31/2021   K 3.8 01/31/2021   CL 105 01/31/2021   CREATININE 0.92 01/31/2021   BUN 14 01/31/2021   CO2 27 01/31/2021   TSH 1.39 09/30/2020   INR 1.00 09/19/2011   HGBA1C 6.7 (H) 09/30/2020   MICROALBUR 1.0 02/27/2006   Assessment and Plan: See notes  Follow Up Instructions: See notes   I discussed the assessment and treatment plan with the patient. The patient was provided an opportunity to ask questions and all were answered. The patient agreed with the plan and demonstrated an understanding of the instructions.   The patient was advised to call back or seek an in-person evaluation if the symptoms worsen or if the condition fails to improve as anticipated.  Cathlean Cower, MD

## 2021-03-19 NOTE — Assessment & Plan Note (Signed)
Mild to mod, for antibx course,  Cough med prn,to f/u any worsening symptoms or concerns 

## 2021-03-19 NOTE — Assessment & Plan Note (Signed)
Last vitamin D Lab Results  Component Value Date   VD25OH 39.36 12/26/2018   Low, reminded to start oral replacement

## 2021-03-19 NOTE — Assessment & Plan Note (Addendum)
Lab Results  Component Value Date   HGBA1C 6.7 (H) 09/30/2020   Stable, pt to continue current medical treatment - diet

## 2021-03-19 NOTE — Assessment & Plan Note (Signed)
Mid, for medrol pack,  to f/u any worsening symptoms or concerns

## 2021-04-04 DIAGNOSIS — L578 Other skin changes due to chronic exposure to nonionizing radiation: Secondary | ICD-10-CM | POA: Diagnosis not present

## 2021-04-04 DIAGNOSIS — L648 Other androgenic alopecia: Secondary | ICD-10-CM | POA: Diagnosis not present

## 2021-04-04 DIAGNOSIS — Z85828 Personal history of other malignant neoplasm of skin: Secondary | ICD-10-CM | POA: Diagnosis not present

## 2021-04-04 DIAGNOSIS — X32XXXS Exposure to sunlight, sequela: Secondary | ICD-10-CM | POA: Diagnosis not present

## 2021-04-04 DIAGNOSIS — L814 Other melanin hyperpigmentation: Secondary | ICD-10-CM | POA: Diagnosis not present

## 2021-04-04 DIAGNOSIS — L570 Actinic keratosis: Secondary | ICD-10-CM | POA: Diagnosis not present

## 2021-04-06 ENCOUNTER — Telehealth: Payer: Self-pay

## 2021-04-06 NOTE — Telephone Encounter (Signed)
Pt has been having diarrhea since 3/10. Have been taking Kaopectate but its not helped at all. ?Pt was offered an appt and she decline wanting a message to be sent to see what the recommendations of Dr. Quay Burow was. ? ?Please advise pt [phone] ? ? ?

## 2021-04-06 NOTE — Telephone Encounter (Signed)
Spoke with patient and recommended Immodium AD. If not better after taking she has been advised to make an appointment for further eval. ?

## 2021-04-10 ENCOUNTER — Other Ambulatory Visit: Payer: Self-pay | Admitting: Internal Medicine

## 2021-05-02 DIAGNOSIS — B309 Viral conjunctivitis, unspecified: Secondary | ICD-10-CM | POA: Diagnosis not present

## 2021-05-03 ENCOUNTER — Telehealth (INDEPENDENT_AMBULATORY_CARE_PROVIDER_SITE_OTHER): Payer: Medicare Other | Admitting: Family Medicine

## 2021-05-03 ENCOUNTER — Encounter: Payer: Self-pay | Admitting: Family Medicine

## 2021-05-03 VITALS — Ht 66.0 in | Wt 231.0 lb

## 2021-05-03 DIAGNOSIS — J329 Chronic sinusitis, unspecified: Secondary | ICD-10-CM

## 2021-05-03 MED ORDER — AZITHROMYCIN 250 MG PO TABS: ORAL_TABLET | ORAL | 0 refills | Status: DC

## 2021-05-03 MED ORDER — AZELASTINE HCL 0.1 % NA SOLN: 2.0000 | Freq: Two times a day (BID) | NASAL | 12 refills | Status: DC

## 2021-05-03 NOTE — Progress Notes (Signed)
? ?  [name] [name] is a 66 y.o. female who presents today for a virtual office visit. ? ?Assessment/Plan:  ?Sinusitis ?No red flags.  We will start azithromycin and Astelin.  Encouraged hydration.  She can continue over-the-counter meds.  She will let us know if not improving.  We discussed reasons to return to care. ? ?  ?Subjective:  ?HPI: ? ?Patient here with sinus infection symptoms. Started about 3 weeks ago. Tried taking OTC medications without much improvement. Symptoms include headache, pressure, cough, and green/yellow mucus production. She has also facial pain. No sick contacts. No fevers or chills.  Feels like sinus infections that she has had in the past.  ? ?   ?  ?Objective/Observations  ?Physical Exam: ?Gen: NAD, resting comfortably ?Pulm: Normal work of breathing ?Neuro: Grossly normal, moves all extremities ?Psych: Normal affect and thought content ? ?Virtual Visit via Video  ? ?I connected with [name] on 05/03/21 at 10:40 AM EDT by a video enabled telemedicine application and verified that I am speaking with the correct person using two identifiers. The limitations of evaluation and management by telemedicine and the availability of in person appointments were discussed. The patient expressed understanding and agreed to proceed.  ? ?Patient location: Home ?Provider location: [place] ?Persons participating in the virtual visit: Myself and Patient ?   ? ?Algis Greenhouse. Jerline Pain, MD ?05/03/2021 10:29 AM  ?

## 2021-05-25 DIAGNOSIS — M4316 Spondylolisthesis, lumbar region: Secondary | ICD-10-CM | POA: Diagnosis not present

## 2021-06-01 ENCOUNTER — Encounter: Payer: Self-pay | Admitting: Gastroenterology

## 2021-06-15 ENCOUNTER — Other Ambulatory Visit: Payer: Self-pay | Admitting: Internal Medicine

## 2021-06-18 ENCOUNTER — Other Ambulatory Visit: Payer: Self-pay | Admitting: Internal Medicine

## 2021-07-03 NOTE — Patient Instructions (Addendum)
     Blood work was ordered.     Medications changes include :   buspar 5 mg twice a day prn for anxiety    A referral was ordered for neurology.     Someone from that office will call you to schedule an appointment.    Return in about 6 months (around 01/03/2022) for Physical Exam.

## 2021-07-03 NOTE — Progress Notes (Unsigned)
Subjective:    Patient ID: [name] [name], female    DOB: 1955-12-08, 66 y.o.   MRN: [id number]     HPI [name] [name] is here for follow up of her chronic medical problems, including htn,  DM, hypothyroid, anxiety, excessive sweating.  Had surgery 01/2021 - L3-4 minimally invasive transforaminal fusion.  She is having increased pain surgery.  No longer has leg pain.  Has increased pain lower back pain - she can not walk far w/o pain.  She has another follow-up with the surgeon soon.   Has joined gym - can do recumbent bike, weights - it has improved her energy level.  She goes 3 times a week.    Anxiety is getting better, but still has some anxiety.  She still has the excessive sweating primarily only in her head-wonders if this is related to anxiety.  It only happens when she is out, but she cannot predict when this will happen.     Medications and allergies reviewed with patient and updated if appropriate.  Current Outpatient Medications on File Prior to Visit  Medication Sig Dispense Refill   azelastine (ASTELIN) 0.1 % nasal spray Place 2 sprays into both nostrils 2 (two) times daily. 30 mL 12   cholecalciferol (VITAMIN D3) 25 MCG (1000 UNIT) tablet Take 1,000 Units by mouth daily.     diclofenac (VOLTAREN) 75 MG EC tablet TAKE ONE TABLET BY MOUTH TWICE A DAY 60 tablet 3   ferrous sulfate 325 (65 FE) MG tablet Take 325 mg by mouth daily.     finasteride (PROSCAR) 5 MG tablet Take 5 mg by mouth daily.     fluticasone (FLONASE) 50 MCG/ACT nasal spray Place 2 sprays into both nostrils daily. (Patient taking differently: Place 2 sprays into both nostrils daily as needed for allergies.) 16 g 1   losartan (COZAAR) 100 MG tablet TAKE ONE TABLET BY MOUTH DAILY 90 tablet 1   metroNIDAZOLE (METROGEL) 1 % gel Apply 1 application topically in the morning and at bedtime.     Multiple Vitamins-Minerals (HAIR/SKIN/NAILS/BIOTIN PO) Take 1 tablet by mouth daily.     oxybutynin (DITROPAN-XL) 5  MG 24 hr tablet TAKE ONE TABLET BY MOUTH EVERY NIGHT AT BEDTIME 90 tablet 0   Polyvinyl Alcohol-Povidone PF (REFRESH) 1.4-0.6 % SOLN Place 1 drop into both eyes 3 (three) times daily as needed (dry eyes).     rosuvastatin (CRESTOR) 20 MG tablet TAKE ONE TABLET BY MOUTH DAILY **MUST CALL MD FOR APPOINTMENT 90 tablet 2   sertraline (ZOLOFT) 100 MG tablet TAKE 1 TABLET BY MOUTH DAILY 90 tablet 1   SYNTHROID 112 MCG tablet TAKE ONE TABLET BY MOUTH DAILY 90 tablet 1   vitamin B-12 (CYANOCOBALAMIN) 1000 MCG tablet Take 1,000 mcg by mouth daily.     No current facility-administered medications on file prior to visit.     Review of Systems  Constitutional:  Positive for diaphoresis. Negative for fever.  Respiratory:  Negative for cough, shortness of breath and wheezing.   Cardiovascular:  Negative for chest pain, palpitations and leg swelling.  Neurological:  Negative for light-headedness and headaches.       Objective:   Vitals:   07/04/21 1033  BP: 140/80  Pulse: 97  Temp: 98.1 F (36.7 C)  SpO2: 99%   BP Readings from Last 3 Encounters:  07/04/21 140/80  02/03/21 116/82  01/31/21 (!) 151/77   Wt Readings from Last 3 Encounters:  07/04/21 230 lb (104.3 kg)  05/03/21 231 lb (104.8 kg)  02/02/21 241 lb (109.3 kg)   Body mass index is 37.12 kg/m.    Physical Exam Constitutional:      General: She is not in acute distress.    Appearance: Normal appearance.  HENT:     Head: Normocephalic and atraumatic.  Eyes:     Conjunctiva/sclera: Conjunctivae normal.  Cardiovascular:     Rate and Rhythm: Normal rate and regular rhythm.     Heart sounds: Normal heart sounds. No murmur heard. Pulmonary:     Effort: Pulmonary effort is normal. No respiratory distress.     Breath sounds: Normal breath sounds. No wheezing.  Musculoskeletal:     Cervical back: Neck supple.     Right lower leg: No edema.     Left lower leg: No edema.  Lymphadenopathy:     Cervical: No cervical  adenopathy.  Skin:    General: Skin is warm and dry.     Findings: No rash.  Neurological:     Mental Status: She is alert. Mental status is at baseline.  Psychiatric:        Mood and Affect: Mood normal.        Behavior: Behavior normal.        Lab Results  Component Value Date   WBC 6.4 01/31/2021   HGB 15.5 (H) 01/31/2021   HCT 47.5 (H) 01/31/2021   PLT 354 01/31/2021   GLUCOSE 115 (H) 01/31/2021   CHOL 166 09/30/2020   TRIG 200.0 (H) 09/30/2020   HDL 37.40 (L) 09/30/2020   LDLDIRECT 90.0 02/03/2020   LDLCALC 88 09/30/2020   ALT 26 09/30/2020   AST 17 09/30/2020   NA 139 01/31/2021   K 3.8 01/31/2021   CL 105 01/31/2021   CREATININE 0.92 01/31/2021   BUN 14 01/31/2021   CO2 27 01/31/2021   TSH 1.39 09/30/2020   INR 1.00 09/19/2011   HGBA1C 6.7 (H) 09/30/2020   MICROALBUR 1.0 02/27/2006     Assessment & Plan:    See Problem List for Assessment and Plan of chronic medical problems.

## 2021-07-04 ENCOUNTER — Encounter: Payer: Self-pay | Admitting: Internal Medicine

## 2021-07-04 ENCOUNTER — Ambulatory Visit (INDEPENDENT_AMBULATORY_CARE_PROVIDER_SITE_OTHER): Payer: Medicare Other | Admitting: Internal Medicine

## 2021-07-04 VITALS — BP 140/80 | HR 97 | Temp 98.1°F | Ht 66.0 in | Wt 230.0 lb

## 2021-07-04 DIAGNOSIS — E119 Type 2 diabetes mellitus without complications: Secondary | ICD-10-CM

## 2021-07-04 DIAGNOSIS — R61 Generalized hyperhidrosis: Secondary | ICD-10-CM

## 2021-07-04 DIAGNOSIS — I10 Essential (primary) hypertension: Secondary | ICD-10-CM

## 2021-07-04 DIAGNOSIS — R413 Other amnesia: Secondary | ICD-10-CM

## 2021-07-04 DIAGNOSIS — E7849 Other hyperlipidemia: Secondary | ICD-10-CM | POA: Diagnosis not present

## 2021-07-04 DIAGNOSIS — E039 Hypothyroidism, unspecified: Secondary | ICD-10-CM

## 2021-07-04 DIAGNOSIS — F411 Generalized anxiety disorder: Secondary | ICD-10-CM

## 2021-07-04 LAB — CBC WITH DIFFERENTIAL/PLATELET
Basophils Absolute: 0.1 10*3/uL (ref 0.0–0.1)
Basophils Relative: 0.7 % (ref 0.0–3.0)
Eosinophils Absolute: 0.2 10*3/uL (ref 0.0–0.7)
Eosinophils Relative: 2 % (ref 0.0–5.0)
HCT: 46.1 % — ABNORMAL HIGH (ref 36.0–46.0)
Hemoglobin: 15.3 g/dL — ABNORMAL HIGH (ref 12.0–15.0)
Lymphocytes Relative: 15.2 % (ref 12.0–46.0)
Lymphs Abs: 1.3 10*3/uL (ref 0.7–4.0)
MCHC: 33.3 g/dL (ref 30.0–36.0)
MCV: 88.3 fl (ref 78.0–100.0)
Monocytes Absolute: 0.5 10*3/uL (ref 0.1–1.0)
Monocytes Relative: 6.2 % (ref 3.0–12.0)
Neutro Abs: 6.6 10*3/uL (ref 1.4–7.7)
Neutrophils Relative %: 75.9 % (ref 43.0–77.0)
Platelets: 306 10*3/uL (ref 150.0–400.0)
RBC: 5.22 Mil/uL — ABNORMAL HIGH (ref 3.87–5.11)
RDW: 13.4 % (ref 11.5–15.5)
WBC: 8.6 10*3/uL (ref 4.0–10.5)

## 2021-07-04 LAB — COMPREHENSIVE METABOLIC PANEL
ALT: 19 U/L (ref 0–35)
AST: 19 U/L (ref 0–37)
Albumin: 4.4 g/dL (ref 3.5–5.2)
Alkaline Phosphatase: 64 U/L (ref 39–117)
BUN: 14 mg/dL (ref 6–23)
CO2: 29 mEq/L (ref 19–32)
Calcium: 9.6 mg/dL (ref 8.4–10.5)
Chloride: 100 mEq/L (ref 96–112)
Creatinine, Ser: 0.85 mg/dL (ref 0.40–1.20)
GFR: 71.52 mL/min (ref >60.00)
Glucose, Bld: 102 mg/dL — ABNORMAL HIGH (ref 70–99)
Potassium: 4.2 mEq/L (ref 3.5–5.1)
Sodium: 137 mEq/L (ref 135–145)
Total Bilirubin: 0.5 mg/dL (ref 0.2–1.2)
Total Protein: 7 g/dL (ref 6.0–8.3)

## 2021-07-04 LAB — LDL CHOLESTEROL, DIRECT: Direct LDL: 108 mg/dL

## 2021-07-04 LAB — HEMOGLOBIN A1C: Hgb A1c MFr Bld: 6.5 % (ref 4.6–6.5)

## 2021-07-04 LAB — LIPID PANEL
Cholesterol: 184 mg/dL (ref 0–200)
HDL: 39.3 mg/dL (ref >39.00)
NonHDL: 144.91
Total CHOL/HDL Ratio: 5
Triglycerides: 340 mg/dL — ABNORMAL HIGH (ref 0.0–149.0)
VLDL: 68 mg/dL — ABNORMAL HIGH (ref 0.0–40.0)

## 2021-07-04 LAB — MICROALBUMIN / CREATININE URINE RATIO
Creatinine,U: 124.8 mg/dL
Microalb Creat Ratio: 0.6 mg/g (ref 0.0–30.0)
Microalb, Ur: <0.7 mg/dL (ref 0.0–1.9)

## 2021-07-04 LAB — VITAMIN B12: Vitamin B-12: >1504 pg/mL — ABNORMAL HIGH (ref 211–911)

## 2021-07-04 LAB — TSH: TSH: 0.97 u[IU]/mL (ref 0.35–5.50)

## 2021-07-04 MED ORDER — BUSPIRONE HCL 5 MG PO TABS: 5.0000 mg | ORAL_TABLET | Freq: Three times a day (TID) | ORAL | 2 refills | Status: DC

## 2021-07-04 MED ORDER — LEVOTHYROXINE SODIUM 112 MCG PO TABS: 112.0000 ug | ORAL_TABLET | Freq: Every day | ORAL | 3 refills | Status: DC

## 2021-07-04 NOTE — Assessment & Plan Note (Addendum)
Chronic Controlled, Stable Retired and had back surgery - had increased anxiety -- she increased the sertraline from 50 mg to 100 mg Continue sertraline 100 mg daily She hopes to be able to this return to the 50 mg daily at some point.  I still wonder if her anxiety is completely controlled.  She may have an element of social anxiety that causes the sweats-we will do a trial of buspirone 5 mg daily as needed to see if that helps

## 2021-07-04 NOTE — Assessment & Plan Note (Addendum)
Chronic  Clinically euthyroid Currently taking Synthroid 112 mcg daily-would like to switch to the generic levothyroxine Check tsh  Titrate med dose if needed

## 2021-07-04 NOTE — Assessment & Plan Note (Signed)
Chronic  Lab Results  Component Value Date   HGBA1C 6.7 (H) 09/30/2020   Sugars well controlled Testing sugars 0 times a day Check A1c, urine microalbumin today Continue diet control Stressed regular exercise, diabetic diet

## 2021-07-04 NOTE — Assessment & Plan Note (Signed)
Chronic In the past she has been concerned about her memory, but now again is concerned about her memory and her husband did mention that he was concerned as well ?  Related to increased anxiety-she feels her anxiety is controlled but I do not think this is well controlled though she thinks Check TSH, B12 level I think she would benefit from neurological evaluation-referral ordered

## 2021-07-04 NOTE — Assessment & Plan Note (Signed)
Chronic Regular exercise and healthy diet encouraged Check lipid panel  Continue Crestor 20 mg daily 

## 2021-07-04 NOTE — Assessment & Plan Note (Addendum)
Chronic Blood pressure well controlled at home and typically lower than it was here today No change in medication CMP Continue losartan 100 mg daily

## 2021-07-04 NOTE — Assessment & Plan Note (Signed)
Chronic Intermittent Tends to only happen when she is out of the house-she wonders if it is related to anxiety Anxiety related sweating is possible Sertraline 100 mg daily is not preventing it.  It is also not the cause-we did wean her off this completely and she still had the sweating Continue sertraline 100 mg daily Oxybutynin did not help-we will discontinue Trial of buspirone 5 mg daily as needed to see if that helps-she will try taking this when she goes out of the house we will try to see what circumstances the sweating typically comes in if this helps

## 2021-07-05 ENCOUNTER — Ambulatory Visit (AMBULATORY_SURGERY_CENTER): Payer: Medicare Other | Admitting: *Deleted

## 2021-07-05 VITALS — Ht 66.0 in | Wt 230.0 lb

## 2021-07-05 DIAGNOSIS — Z86010 Personal history of colonic polyps: Secondary | ICD-10-CM

## 2021-07-05 MED ORDER — NA SULFATE-K SULFATE-MG SULF 17.5-3.13-1.6 GM/177ML PO SOLN: 1.0000 | Freq: Once | ORAL | 0 refills | Status: AC

## 2021-07-05 MED ORDER — ICOSAPENT ETHYL 1 G PO CAPS: 2.0000 g | ORAL_CAPSULE | Freq: Two times a day (BID) | ORAL | 5 refills | Status: DC

## 2021-07-05 NOTE — Addendum Note (Signed)
Addended by: Binnie Rail on: 07/05/2021 05:34 AM   Modules accepted: Orders

## 2021-07-05 NOTE — Progress Notes (Signed)
No egg or soy allergy known to patient  No issues known to pt with past sedation with any surgeries or procedures Patient denies ever being told they had issues or difficulty with intubation  No FH of Malignant Hyperthermia Pt is not on diet pills Pt is not on  home 02  Pt is not on blood thinners  Pt denies issues with constipation  No A fib or A flutter   NO PA's for preps discussed with pt In PV today  Discussed with pt there will be an out-of-pocket cost for prep and that varies from $0 to 70 +  dollars - pt verbalized understanding  Pt instructed to use Singlecare.com or GoodRx for a price reduction on prep   PV completed over the phone. Pt verified name, DOB, address and insurance during PV today.   Pt encouraged to call with questions or issues.  If pt has My chart, procedure instructions sent via My Chart  Insurance confirmed with pt at [facility] today

## 2021-07-06 ENCOUNTER — Encounter: Payer: Self-pay | Admitting: Physician Assistant

## 2021-07-06 DIAGNOSIS — M4316 Spondylolisthesis, lumbar region: Secondary | ICD-10-CM | POA: Diagnosis not present

## 2021-07-08 ENCOUNTER — Other Ambulatory Visit: Payer: Self-pay | Admitting: Internal Medicine

## 2021-07-15 ENCOUNTER — Ambulatory Visit: Payer: Medicare Other | Admitting: Physician Assistant

## 2021-07-15 ENCOUNTER — Encounter: Payer: Self-pay | Admitting: Physician Assistant

## 2021-07-15 VITALS — BP 143/80 | HR 104 | Resp 18 | Ht 66.0 in | Wt 232.0 lb

## 2021-07-15 DIAGNOSIS — R413 Other amnesia: Secondary | ICD-10-CM | POA: Diagnosis not present

## 2021-07-15 DIAGNOSIS — F09 Unspecified mental disorder due to known physiological condition: Secondary | ICD-10-CM | POA: Diagnosis not present

## 2021-07-15 NOTE — Progress Notes (Signed)
Assessment/Plan:   [name] [name] is a very pleasant 66 y.o. year old RH female with a history of hypertension, hyperlipidemia, prediabetes, peripheral neuropathy,  hypothyroidism, arthritis, depression, seen today for evaluation of memory loss. MoCA today is 24/30 with delayed recall 3/5, normal visuospatial and some deficiency in attention. Findings suspicious for MCI, but with a possible ADD/ADHD and anxiety component. Patient admits that although ADD/ADHD was never formally diagnosed, she is suspicious that she "always had it, my 2 kids have it"    Recommendations:   MIld Cognitive Impairment  MRI brain with/without contrast to assess for underlying structural abnormality and assess vascular load  Follow up in 1 month Recommended BH for evaluation of ADD/ADHD, anxiety. Recommend good control of cardiovascular risk factors   Subjective:    The patient is seen in neurologic consultation at the request of Burns, Claudina Lick, MD for the evaluation of memory.  The patient is accompanied by her husband   who supplements the history.  How long did patient have memory difficulties? "For years, 10 years" more noticeable recently.  STM  worse than LTM She cannot remember recent conversations, especially over the last 2 yrs "nothing is there"-she says. For example, she cannot remember her daughter in law's name"but after a few mins it comes back to me"-she says. 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 Patient lives with: Spouse, who reports that her changes are mild, perhaps "really it is about people's names ". She is also very anxious, she fidgets her knees more frequently". She tries to do crosswords, word findings, and tries to socialize to maintain her cognitive status.  repeats oneself? Denies  Disoriented when walking into a room?  Patient denies   Leaving objects in unusual places?  Patient denies    Ambulates  with difficulty?   Patient denies.  She goes to the Gym 3 x a week    Recent falls?  Patient denies   Any head injuries?  Patient denies   History of seizures?   Patient denies   Wandering behavior?  Patient denies   Patient drives?  No issues with driving  Any mood changes such irritability agitation? Irritability, since retiring she has to share a lot more time with her husband    Any history of depression?: Endorsed. She is on Zoloft and noticed some anxiety lately. Considering psychotherapy  Hallucinations?  Patient denies   Paranoia?  Patient denies   Patient reports that he sleeps well without vivid dreams, REM behavior or sleepwalking    History of sleep apnea?  Patient denies   Any hygiene concerns?  Patient denies   Independent of bathing and dressing?  Endorsed  Does the patient needs help with medications? Patient is in charge  Who is in charge of the finances?  Patient is in charge with her husband  Any changes in appetite?  Patient denies   Patient have trouble swallowing? Patient denies   Does the patient cook?  Patient denies   Any kitchen accidents such as leaving the stove on? Patient denies   Any headaches?  Patient denies   The double vision? Patient denies   Any focal numbness or tingling?  Patient denies   Chronic back pain  Endorsed, back surgery in Jan   Unilateral weakness?  Patient denies   Any tremors?  Patient denies   Any history of anosmia?  Patient denies  Any incontinence of urine?  Patient denies   Any bowel dysfunction?   Patient denies      History of heavy alcohol intake?  Patient denies   History of heavy tobacco use?  Patient denies   Family history of dementia? Mother died at 68 w Leukemia "but she was exactly like me before she died"    Labs 07-22-2021  A1C 6.5 , CBC HH 15.3/46.1, LDL nl, TSH 0.97, B12 1504  Past Medical History:  Diagnosis Date   Arthritis    Cholelithiasis    Dysrhythmia    Heart murmur    Hepatic steatosis     History of prediabetes    Hyperlipemia    Hypertension    Hypothyroidism    PONV (postoperative nausea and vomiting)    Pre-diabetes      Past Surgical History:  Procedure Laterality Date   ABDOMINAL HYSTERECTOMY     @ age 27, due to fibroids    COLONOSCOPY W/ POLYPECTOMY  01/24/2008   Dr.Jacobs, due 2015    DILATION AND CURETTAGE OF UTERUS     JOINT REPLACEMENT     left knee   MOHS SURGERY  01/23/2002   Basal Cell    TONSILLECTOMY     TOTAL KNEE ARTHROPLASTY  09/22/2011   Dr Nicholes Stairs, Left;   TRANSFORAMINAL LUMBAR INTERBODY FUSION W/ MIS 1 LEVEL N/A 02/02/2021   Procedure: Lumbar three-four Minimally Invasive Transforaminal Lumbar Interbody Fusion;  Surgeon: [name], MD;  Location: [place];  Service: Neurosurgery;  Laterality: N/A;     Allergies  Allergen Reactions   Ampicillin Rash    rash   Doxycycline Other (See Comments)    Joint symptoms w/o rash or fever   Effexor [Venlafaxine] Other (See Comments)    Constipation, elevated BP   Oxycodone Nausea Only   Penicillins Rash    Current Outpatient Medications  Medication Instructions   azelastine (ASTELIN) 0.1 % nasal spray 2 sprays, Each Nare, 2 times daily   busPIRone (BUSPAR) 5 mg, Oral, 3 times daily   cholecalciferol (VITAMIN D3) 1,000 Units, Oral, Daily   diclofenac (VOLTAREN) 75 MG EC tablet TAKE ONE TABLET BY MOUTH TWICE A DAY   ferrous sulfate 325 mg, Oral, Daily   finasteride (PROSCAR) 5 mg, Oral, Daily   fluticasone (FLONASE) 50 MCG/ACT nasal spray 2 sprays, Each Nare, Daily   icosapent Ethyl (VASCEPA) 2 g, Oral, 2 times daily   levothyroxine (SYNTHROID) 112 mcg, Oral, Daily   losartan (COZAAR) 100 MG tablet TAKE ONE TABLET BY MOUTH DAILY   metroNIDAZOLE (METROGEL) 1 % gel 1 application , Topical, 2 times daily   Multiple Vitamins-Minerals (HAIR/SKIN/NAILS/BIOTIN PO) 1 tablet, Oral, Daily   oxybutynin (DITROPAN-XL) 5 MG 24 hr tablet TAKE ONE TABLET BY MOUTH EVERY NIGHT AT BEDTIME   Polyvinyl  Alcohol-Povidone PF (REFRESH) 1.4-0.6 % SOLN 1 drop, Both Eyes, 3 times daily PRN   rosuvastatin (CRESTOR) 20 MG tablet TAKE ONE TABLET BY MOUTH DAILY - MUST CALL MD FOR APPOINTMENT   sertraline (ZOLOFT) 100 MG tablet TAKE 1 TABLET BY MOUTH DAILY   vitamin B-12 (CYANOCOBALAMIN) 1,000 mcg, Oral, Daily     VITALS:   Vitals:   07/15/21 1001  BP: (!) 143/80  Pulse: (!) 104  Resp: 18  SpO2: 96%  Weight: 232 lb (105.2 kg)  Height: '5\' 6"'$  (1.676 m)     PHYSICAL EXAM   HEENT:  Normocephalic, atraumatic. The mucous membranes are moist. The superficial temporal arteries are without ropiness or  tenderness. Cardiovascular: Regular rate and rhythm. Lungs: Clear to auscultation bilaterally. Neck: There are no carotid bruits noted bilaterally.  NEUROLOGICAL:    07/17/2021    7:00 PM  Montreal Cognitive Assessment   Visuospatial/ Executive (0/5) 5  Naming (0/3) 3  Attention: Read list of digits (0/2) 2  Attention: Read list of letters (0/1) 1  Attention: Serial 7 subtraction starting at 100 (0/3) 1  Language: Repeat phrase (0/2) 2  Language : Fluency (0/1) 1  Abstraction (0/2) 1  Delayed Recall (0/5) 3  Orientation (0/6) 5  Total 24  Adjusted Score (based on education) 24        No data to display           Orientation:  Alert and oriented to person, place and time. No aphasia or dysarthria. Fund of knowledge is appropriate. Recent memory impaired and remote memory intact.  Attention and concentration are normal.  Able to name objects and repeat phrases. Delayed recall 3/5 .Anxious appearing, fidgeting her knees frequently  Cranial nerves: There is good facial symmetry. Extraocular muscles are intact and visual fields are full to confrontational testing. Speech is fluent and clear. Soft palate rises symmetrically and there is no tongue deviation. Hearing is intact to conversational tone. Tone: Tone is good throughout. Sensation: Sensation is intact to light touch and pinprick  throughout. Vibration is intact at the bilateral big toe.There is no extinction with double simultaneous stimulation. There is no sensory dermatomal level identified. Coordination: The patient has no difficulty with RAM's or FNF bilaterally. Normal finger to nose  Motor: Strength is 5/5 in the bilateral upper and lower extremities. There is no pronator drift. There are no fasciculations noted. DTR's: Deep tendon reflexes are 2/4 at the bilateral biceps, triceps, brachioradialis, patella and achilles.  Plantar responses are downgoing bilaterally. Gait and Station: The patient is able to ambulate without difficulty.The patient is able to heel toe walk without any difficulty.The patient is able to ambulate in a tandem fashion. The patient is able to stand in the Romberg position.     Thank you for allowing [place] the opportunity to participate in the care of this nice patient. Please do not hesitate to contact us for any questions or concerns.   Total time spent on today's visit was 45 minutes dedicated to this patient today, preparing to see patient, examining the patient, ordering tests and/or medications and counseling the patient, documenting clinical information in the EHR or other health record, independently interpreting results and communicating results to the patient/family, discussing treatment and goals, answering patient's questions and coordinating care.  Cc:  Binnie Rail, MD  Sharene Butters 07/17/2021 7:41 PM

## 2021-07-17 DIAGNOSIS — G3184 Mild cognitive impairment, so stated: Secondary | ICD-10-CM

## 2021-07-17 DIAGNOSIS — F09 Unspecified mental disorder due to known physiological condition: Secondary | ICD-10-CM | POA: Insufficient documentation

## 2021-07-17 HISTORY — DX: Mild cognitive impairment of uncertain or unknown etiology: G31.84

## 2021-07-20 ENCOUNTER — Other Ambulatory Visit: Payer: Self-pay | Admitting: Internal Medicine

## 2021-07-20 DIAGNOSIS — M5416 Radiculopathy, lumbar region: Secondary | ICD-10-CM | POA: Diagnosis not present

## 2021-07-22 ENCOUNTER — Encounter: Payer: Self-pay | Admitting: Gastroenterology

## 2021-07-27 ENCOUNTER — Telehealth: Payer: Self-pay | Admitting: *Deleted

## 2021-07-27 ENCOUNTER — Ambulatory Visit
Admission: RE | Admit: 2021-07-27 | Discharge: 2021-07-27 | Disposition: A | Discharge status: Home / Self Care | Payer: Medicare Other | Source: Ambulatory Visit | Attending: Physician Assistant | Admitting: Physician Assistant

## 2021-07-27 DIAGNOSIS — R413 Other amnesia: Secondary | ICD-10-CM | POA: Diagnosis not present

## 2021-07-27 NOTE — Telephone Encounter (Signed)
Dr. Ardis Hughs,  This pt is a documented difficult intubation and his procedure will need to be done at the hospital.    Thanks,  Osvaldo Angst

## 2021-07-28 NOTE — Progress Notes (Signed)
Please inform patient MRI of the brain is normal. Thanks

## 2021-08-01 ENCOUNTER — Encounter: Payer: Self-pay | Admitting: Gastroenterology

## 2021-08-01 ENCOUNTER — Ambulatory Visit (AMBULATORY_SURGERY_CENTER): Payer: Medicare Other | Admitting: Gastroenterology

## 2021-08-01 VITALS — BP 107/45 | HR 85 | Temp 98.6°F | Resp 13 | Ht 66.0 in | Wt 230.0 lb

## 2021-08-01 DIAGNOSIS — E785 Hyperlipidemia, unspecified: Secondary | ICD-10-CM | POA: Diagnosis not present

## 2021-08-01 DIAGNOSIS — Z09 Encounter for follow-up examination after completed treatment for conditions other than malignant neoplasm: Secondary | ICD-10-CM

## 2021-08-01 DIAGNOSIS — D123 Benign neoplasm of transverse colon: Secondary | ICD-10-CM | POA: Diagnosis not present

## 2021-08-01 DIAGNOSIS — Z86010 Personal history of colonic polyps: Secondary | ICD-10-CM | POA: Diagnosis not present

## 2021-08-01 DIAGNOSIS — R7303 Prediabetes: Secondary | ICD-10-CM | POA: Diagnosis not present

## 2021-08-01 MED ORDER — SODIUM CHLORIDE 0.9 % IV SOLN: 500.0000 mL | Freq: Once | INTRAVENOUS | Status: DC

## 2021-08-01 NOTE — Progress Notes (Signed)
HPI: This is a woman with history of polyps   Colonoscopy for 2011 Dr. Ardis Hughs found a single subcentimeter tubular adenoma.  ROS: complete GI ROS as described in HPI, all other review negative.  Constitutional:  No unintentional weight loss   Past Medical History:  Diagnosis Date   Arthritis    Cholelithiasis    Dysrhythmia    Heart murmur    Hepatic steatosis    History of prediabetes    Hyperlipemia    Hypertension    Hypothyroidism    PONV (postoperative nausea and vomiting)    Pre-diabetes     Past Surgical History:  Procedure Laterality Date   ABDOMINAL HYSTERECTOMY     @ age 76, due to fibroids    COLONOSCOPY W/ POLYPECTOMY  01/24/2008   Dr.Sumayah Bearse, due 2015    DILATION AND CURETTAGE OF UTERUS     JOINT REPLACEMENT     left knee   MOHS SURGERY  01/23/2002   Basal Cell    TONSILLECTOMY     TOTAL KNEE ARTHROPLASTY  09/22/2011   Dr Nicholes Stairs, Left;   TRANSFORAMINAL LUMBAR INTERBODY FUSION W/ MIS 1 LEVEL N/A 02/02/2021   Procedure: Lumbar three-four Minimally Invasive Transforaminal Lumbar Interbody Fusion;  Surgeon: [name], MD;  Location: [place];  Service: Neurosurgery;  Laterality: N/A;    Current Outpatient Medications  Medication Sig Dispense Refill   azelastine (ASTELIN) 0.1 % nasal spray Place 2 sprays into both nostrils 2 (two) times daily. 30 mL 12   busPIRone (BUSPAR) 5 MG tablet Take 1 tablet (5 mg total) by mouth 3 (three) times daily. 90 tablet 2   cholecalciferol (VITAMIN D3) 25 MCG (1000 UNIT) tablet Take 1,000 Units by mouth daily.     diclofenac (VOLTAREN) 75 MG EC tablet TAKE ONE TABLET BY MOUTH TWICE A DAY (Patient not taking: Reported on 07/05/2021) 60 tablet 3   ferrous sulfate 325 (65 FE) MG tablet Take 325 mg by mouth daily.     finasteride (PROSCAR) 5 MG tablet Take 5 mg by mouth daily.     fluticasone (FLONASE) 50 MCG/ACT nasal spray Place 2 sprays into both nostrils daily. (Patient taking differently: Place 2 sprays into both  nostrils daily as needed for allergies.) 16 g 1   icosapent Ethyl (VASCEPA) 1 g capsule Take 2 capsules (2 g total) by mouth 2 (two) times daily. 120 capsule 5   levothyroxine (SYNTHROID) 112 MCG tablet Take 1 tablet (112 mcg total) by mouth daily. 90 tablet 3   losartan (COZAAR) 100 MG tablet TAKE ONE TABLET BY MOUTH DAILY 90 tablet 1   metroNIDAZOLE (METROGEL) 1 % gel Apply 1 application topically in the morning and at bedtime.     Multiple Vitamins-Minerals (HAIR/SKIN/NAILS/BIOTIN PO) Take 1 tablet by mouth daily.     oxybutynin (DITROPAN-XL) 5 MG 24 hr tablet TAKE ONE TABLET BY MOUTH EVERY NIGHT AT BEDTIME (Patient not taking: Reported on 07/15/2021) 90 tablet 2   Polyvinyl Alcohol-Povidone PF (REFRESH) 1.4-0.6 % SOLN Place 1 drop into both eyes 3 (three) times daily as needed (dry eyes).     rosuvastatin (CRESTOR) 20 MG tablet TAKE ONE TABLET BY MOUTH DAILY - MUST CALL MD FOR APPOINTMENT 90 tablet 2   sertraline (ZOLOFT) 100 MG tablet TAKE 1 TABLET BY MOUTH DAILY 90 tablet 1   vitamin B-12 (CYANOCOBALAMIN) 1000 MCG tablet Take 1,000 mcg by mouth daily.     No current facility-administered medications for this visit.    Allergies as of  08/01/2021 - Review Complete 08/01/2021  Allergen Reaction Noted   Ampicillin Rash    Doxycycline Other (See Comments)    Effexor [venlafaxine] Other (See Comments) 07/21/2020   Oxycodone Nausea Only 10/14/2011   Penicillins Rash 03/06/2016    Family History  Problem Relation Age of Onset   Diabetes Mother    Leukemia Mother    Alcohol abuse Father    Lung cancer Father        smoker   Diabetes Brother    Heart attack Maternal Uncle        X3 ; MIs in 74s & 69s   Stroke Maternal Uncle        in his 36s   Adrenal disorder Neg Hx    Colon cancer Neg Hx    Colon polyps Neg Hx    Esophageal cancer Neg Hx    Rectal cancer Neg Hx    Stomach cancer Neg Hx     Social History   Socioeconomic History   Marital status: Married    Spouse name:  Not on file   Number of children: 2   Years of education: 14   Highest education level: Not on file  Occupational History   Not on file  Tobacco Use   Smoking status: Never   Smokeless tobacco: Never  Vaping Use   Vaping Use: Never used  Substance and Sexual Activity   Alcohol use: Yes    Comment: rare   Drug use: No   Sexual activity: Not on file  Other Topics Concern   Not on file  Social History Narrative   Not exercising regularly   Right handed   Caffeine yes   Two story home    Social Determinants of Health   Financial Resource Strain: Not on file  Food Insecurity: Not on file  Transportation Needs: Not on file  Physical Activity: Not on file  Stress: Not on file  Social Connections: Not on file  Intimate Partner Violence: Not on file     Physical Exam: BP 122/65 (BP Location: Right Arm, Patient Position: Sitting, Cuff Size: Normal)   Pulse (!) 105 Comment: nervous  Temp 98.6 F (37 C) (Temporal)   Ht '5\' 6"'$  (1.676 m)   Wt 230 lb (104.3 kg)   SpO2 95%   BMI 37.12 kg/m  Constitutional: generally well-appearing Psychiatric: alert and oriented x3 Lungs: CTA bilaterally Heart: no MCR  Assessment and plan: 66 y.o. female with history of polyps  Surveillance colonoscopy today  Care is appropriate for the ambulatory setting.  Owens Loffler, MD [facility] 08/01/2021, 7:40 AM

## 2021-08-01 NOTE — Progress Notes (Signed)
PT taken to PACU. Monitors in place. VSS. Report given to RN. 

## 2021-08-01 NOTE — Patient Instructions (Signed)
Impression/Recommendations:  Polyp, diverticulosis, and hemorrhoid handouts given to patient.  Resume previous diet. Continue present medications. Start once daily fiber supplement (powder metamucil or citrucel).  Call Dr. Ardis Hughs office in 4 weeks to report on your response.  Await pathology results.  YOU HAD AN ENDOSCOPIC PROCEDURE TODAY AT [facility]:   Refer to the procedure report that was given to you for any specific questions about what was found during the examination.  If the procedure report does not answer your questions, please call your gastroenterologist to clarify.  If you requested that your care partner not be given the details of your procedure findings, then the procedure report has been included in a sealed envelope for you to review at your convenience later.  YOU SHOULD EXPECT: Some feelings of bloating in the abdomen. Passage of more gas than usual.  Walking can help get rid of the air that was put into your GI tract during the procedure and reduce the bloating. If you had a lower endoscopy (such as a colonoscopy or flexible sigmoidoscopy) you may notice spotting of blood in your stool or on the toilet paper. If you underwent a bowel prep for your procedure, you may not have a normal bowel movement for a few days.  Please Note:  You might notice some irritation and congestion in your nose or some drainage.  This is from the oxygen used during your procedure.  There is no need for concern and it should clear up in a day or so.  SYMPTOMS TO REPORT IMMEDIATELY:  Following lower endoscopy (colonoscopy or flexible sigmoidoscopy):  Excessive amounts of blood in the stool  Significant tenderness or worsening of abdominal pains  Swelling of the abdomen that is new, acute  Fever of 100F or higher  For urgent or emergent issues, a gastroenterologist can be reached at any hour by calling [phone]. Do not use MyChart messaging for urgent concerns.     DIET:  We do recommend a small meal at first, but then you may proceed to your regular diet.  Drink plenty of fluids but you should avoid alcoholic beverages for 24 hours.  ACTIVITY:  You should plan to take it easy for the rest of today and you should NOT DRIVE or use heavy machinery until tomorrow (because of the sedation medicines used during the test).    FOLLOW UP: Our staff will call the number listed on your records the next business day following your procedure.  We will call around 7:15- 8:00 am to check on you and address any questions or concerns that you may have regarding the information given to you following your procedure. If we do not reach you, we will leave a message.  If you develop any symptoms (ie: fever, flu-like symptoms, shortness of breath, cough etc.) before then, please call [phone].  If you test positive for Covid 19 in the 2 weeks post procedure, please call and report this information to [place].    If any biopsies were taken you will be contacted by phone or by letter within the next 1-3 weeks.  Please call us at [phone] if you have not heard about the biopsies in 3 weeks.    SIGNATURES/CONFIDENTIALITY: You and/or your care partner have signed paperwork which will be entered into your electronic medical record.  These signatures attest to the fact that that the information above on your After Visit Summary has been reviewed and is understood.  Full responsibility of the confidentiality  of this discharge information lies with you and/or your care-partner.  

## 2021-08-01 NOTE — Progress Notes (Signed)
Vitals-DT  History reviewed. 

## 2021-08-01 NOTE — Op Note (Signed)
[facility] Patient Name: [name] [name] Procedure Date: 08/01/2021 8:20 AM MRN: [id number] Endoscopist: Milus Banister , MD Age: 66 Referring MD:  Date of Birth: 1955/07/31 Gender: Female Account #: [id number] Procedure:                Colonoscopy Indications:              High risk colon cancer surveillance: Personal                            history of colonic polyps; Colonoscopy 04/2009 Dr.                            Ardis Hughs found a single subcentimeter tubular adenoma Medicines:                Monitored Anesthesia Care Procedure:                Pre-Anesthesia Assessment:                           - Prior to the procedure, a History and Physical                            was performed, and patient medications and                            allergies were reviewed. The patient's tolerance of                            previous anesthesia was also reviewed. The risks                            and benefits of the procedure and the sedation                            options and risks were discussed with the patient.                            All questions were answered, and informed consent                            was obtained. Prior Anticoagulants: The patient has                            taken no previous anticoagulant or antiplatelet                            agents. ASA Grade Assessment: II - A patient with                            mild systemic disease. After reviewing the risks                            and benefits, the patient was deemed in  satisfactory condition to undergo the procedure.                           After obtaining informed consent, the colonoscope                            was passed under direct vision. Throughout the                            procedure, the patient's blood pressure, pulse, and                            oxygen saturations were monitored continuously. The                            Olympus  [id number]-H190DL (#[id number]) Colonoscope was                            introduced through the anus and advanced to the the                            cecum, identified by appendiceal orifice and                            ileocecal valve. The colonoscopy was performed                            without difficulty. The patient tolerated the                            procedure well. The quality of the bowel                            preparation was good. The ileocecal valve,                            appendiceal orifice, and rectum were photographed. Scope In: 8:24:28 AM Scope Out: 8:36:03 AM Scope Withdrawal Time: 0 hours 9 minutes 8 seconds  Total Procedure Duration: 0 hours 11 minutes 35 seconds  Findings:                 A 2 mm polyp was found in the transverse colon. The                            polyp was sessile. The polyp was removed with a                            cold snare. Resection and retrieval were complete.                           Multiple small and large-mouthed diverticula were                            found in the left colon.  External and internal hemorrhoids were found. The                            hemorrhoids were medium-sized.                           The exam was otherwise without abnormality on                            direct and retroflexion views. Complications:            No immediate complications. Estimated blood loss:                            None. Estimated Blood Loss:     Estimated blood loss: none. Impression:               - One 2 mm polyp in the transverse colon, removed                            with a cold snare. Resected and retrieved.                           - Diverticulosis in the left colon.                           - External and internal hemorrhoids.                           - The examination was otherwise normal on direct                            and retroflexion views. Recommendation:           -  Patient has a contact number available for                            emergencies. The signs and symptoms of potential                            delayed complications were discussed with the                            patient. Return to normal activities tomorrow.                            Written discharge instructions were provided to the                            patient.                           - Resume previous diet.                           - Continue present medications. Please start once  daily OTC fiber supplement (powder metamucil or                            citrucel) and call my office in 4 weeks to report                            on your response.                           - Await pathology results. Milus Banister, MD 08/01/2021 8:39:30 AM This report has been signed electronically.

## 2021-08-02 ENCOUNTER — Telehealth: Payer: Self-pay | Admitting: *Deleted

## 2021-08-02 NOTE — Telephone Encounter (Signed)
  Follow up Call-     08/01/2021    7:44 AM 08/01/2021    7:29 AM  Call back number  Post procedure Call Back phone  # [phone]   Permission to leave phone message  Yes     Patient questions:  Do you have a fever, pain , or abdominal swelling? No. Pain Score  0 *  Have you tolerated food without any problems? Yes.    Have you been able to return to your normal activities? Yes.    Do you have any questions about your discharge instructions: Diet   No. Medications  No. Follow up visit  No.  Do you have questions or concerns about your Care? No.  Actions: * If pain score is 4 or above: No action needed, pain <4.

## 2021-08-08 DIAGNOSIS — N751 Abscess of Bartholin's gland: Secondary | ICD-10-CM | POA: Diagnosis not present

## 2021-08-08 HISTORY — DX: Abscess of Bartholin's gland: N75.1

## 2021-08-09 ENCOUNTER — Encounter: Payer: Self-pay | Admitting: Gastroenterology

## 2021-08-10 DIAGNOSIS — M4316 Spondylolisthesis, lumbar region: Secondary | ICD-10-CM | POA: Diagnosis not present

## 2021-08-17 DIAGNOSIS — N751 Abscess of Bartholin's gland: Secondary | ICD-10-CM | POA: Diagnosis not present

## 2021-08-24 ENCOUNTER — Ambulatory Visit: Payer: Medicare Other | Admitting: Physician Assistant

## 2021-08-29 DIAGNOSIS — M47816 Spondylosis without myelopathy or radiculopathy, lumbar region: Secondary | ICD-10-CM | POA: Diagnosis not present

## 2021-09-01 ENCOUNTER — Ambulatory Visit: Payer: Medicare Other | Admitting: Physician Assistant

## 2021-09-01 VITALS — BP 142/79 | HR 97 | Resp 20 | Ht 66.0 in | Wt 231.0 lb

## 2021-09-01 DIAGNOSIS — R413 Other amnesia: Secondary | ICD-10-CM | POA: Diagnosis not present

## 2021-09-01 NOTE — Patient Instructions (Addendum)
It was a pleasure to see you today at our office.   Recommendations:   Have good control of the cardiovascular risk factors Consider behavioral therapy    Whom to call:  Memory  decline, memory medications: Call our office [phone]   For psychiatric meds, mood meds: Please have your primary care physician manage these medications.   Counseling regarding caregiver distress, including caregiver depression, anxiety and issues regarding community resources, adult day care programs, adult living facilities, or memory care questions:   Feel free to contact [place], Social Worker at [phone]   For assessment of decision of mental capacity and competency:  Call Dr. Anthoney Harada, geriatric psychiatrist at [phone]  For guidance in geriatric dementia issues please call [facility] [phone]  For guidance regarding [facility] and if placement were needed at the facility, contact Arnell Asal, Social Worker tel: [phone]  If you have any severe symptoms of a stroke, or other severe issues such as confusion,severe chills or fever, etc call 911 or go to the ER as you may need to be evaluated further   Feel free to visit Facebook page " [email or web address]" for tips of how to care for people with memory problems.   Feel free to go to the following database for funded clinical studies conducted around the world: http://saunders.com/   https://www.triadclinicaltrials.com/     RECOMMENDATIONS FOR ALL PATIENTS WITH MEMORY PROBLEMS: 1. Continue to exercise (Recommend 30 minutes of walking everyday, or 3 hours every week) 2. Increase social interactions - continue going to [place] and enjoy social gatherings with friends and family 3. Eat healthy, avoid fried foods and eat more fruits and vegetables 4. Maintain adequate blood pressure, blood sugar, and blood cholesterol level. Reducing the risk of stroke and cardiovascular disease also  helps promoting better memory. 5. Avoid stressful situations. Live a simple life and avoid aggravations. Organize your time and prepare for the next day in anticipation. 6. Sleep well, avoid any interruptions of sleep and avoid any distractions in the bedroom that may interfere with adequate sleep quality 7. Avoid sugar, avoid sweets as there is a strong link between excessive sugar intake, diabetes, and cognitive impairment We discussed the Mediterranean diet, which has been shown to help patients reduce the risk of progressive memory disorders and reduces cardiovascular risk. This includes eating fish, eat fruits and green leafy vegetables, nuts like almonds and hazelnuts, walnuts, and also use olive oil. Avoid fast foods and fried foods as much as possible. Avoid sweets and sugar as sugar use has been linked to worsening of memory function.  There is always a concern of gradual progression of memory problems. If this is the case, then we may need to adjust level of care according to patient needs. Support, both to the patient and caregiver, should then be put into place.      You have been referred for a neuropsychological evaluation (i.e., evaluation of memory and thinking abilities). Please bring someone with you to this appointment if possible, as it is helpful for the doctor to hear from both you and another adult who knows you well. Please bring eyeglasses and hearing aids if you wear them.    The evaluation will take approximately 3 hours and has two parts:   The first part is a clinical interview with the neuropsychologist (Dr. Melvyn Novas or Dr. Nicole Kindred). During the interview, the neuropsychologist will speak with you and the individual you brought to the appointment.    The  second part of the evaluation is testing with the doctor's technician Hinton Dyer or Maudie Mercury). During the testing, the technician will ask you to remember different types of material, solve problems, and answer some questionnaires.  Your family member will not be present for this portion of the evaluation.   Please note: We must reserve several hours of the neuropsychologist's time and the psychometrician's time for your evaluation appointment. As such, there is a No-Show fee of $100. If you are unable to attend any of your appointments, please contact our office as soon as possible to reschedule.    FALL PRECAUTIONS: Be cautious when walking. Scan the area for obstacles that may increase the risk of trips and falls. When getting up in the mornings, sit up at the edge of the bed for a few minutes before getting out of bed. Consider elevating the bed at the head end to avoid drop of blood pressure when getting up. Walk always in a well-lit room (use night lights in the walls). Avoid area rugs or power cords from appliances in the middle of the walkways. Use a walker or a cane if necessary and consider physical therapy for balance exercise. Get your eyesight checked regularly.  FINANCIAL OVERSIGHT: Supervision, especially oversight when making financial decisions or transactions is also recommended.  HOME SAFETY: Consider the safety of the kitchen when operating appliances like stoves, microwave oven, and blender. Consider having supervision and share cooking responsibilities until no longer able to participate in those. Accidents with firearms and other hazards in the house should be identified and addressed as well.   ABILITY TO BE LEFT ALONE: If patient is unable to contact 911 operator, consider using LifeLine, or when the need is there, arrange for someone to stay with patients. Smoking is a fire hazard, consider supervision or cessation. Risk of wandering should be assessed by caregiver and if detected at any point, supervision and safe proof recommendations should be instituted.  MEDICATION SUPERVISION: Inability to self-administer medication needs to be constantly addressed. Implement a mechanism to ensure safe  administration of the medications.   DRIVING: Regarding driving, in patients with progressive memory problems, driving will be impaired. We advise to have someone else do the driving if trouble finding directions or if minor accidents are reported. Independent driving assessment is available to determine safety of driving.   If you are interested in the driving assessment, you can contact the following:  [employer] in [place]  [place] [phone] or [phone]    Deepstep refers to food and lifestyle choices that are based on the traditions of countries located on the [employer]. This way of eating has been shown to help prevent certain conditions and improve outcomes for people who have chronic diseases, like kidney disease and heart disease. What are tips for following this plan? Lifestyle  Cook and eat meals together with your family, when possible. Drink enough fluid to keep your urine clear or pale yellow. Be physically active every day. This includes: Aerobic exercise like running or swimming. Leisure activities like gardening, walking, or housework. Get 7-8 hours of sleep each night. If recommended by your health care provider, drink red wine in moderation. This means 1 glass a day for nonpregnant women and 2 glasses a day for men. A glass of wine equals 5 oz (150 mL). Reading food labels  Check the serving size of packaged foods. For foods  such as rice and pasta, the serving size refers to the amount of cooked product, not dry. Check the total fat in packaged foods. Avoid foods that have saturated fat or trans fats. Check the ingredients list for added sugars, such as corn syrup. Shopping  At the grocery store, buy most of your food from the areas near the walls of the store. This includes: Fresh fruits and  vegetables (produce). Grains, beans, nuts, and seeds. Some of these may be available in unpackaged forms or large amounts (in bulk). Fresh seafood. Poultry and eggs. Low-fat dairy products. Buy whole ingredients instead of prepackaged foods. Buy fresh fruits and vegetables in-season from local farmers markets. Buy frozen fruits and vegetables in resealable bags. If you do not have access to quality fresh seafood, buy precooked frozen shrimp or canned fish, such as tuna, salmon, or sardines. Buy small amounts of raw or cooked vegetables, salads, or olives from the deli or salad bar at your store. Stock your pantry so you always have certain foods on hand, such as olive oil, canned tuna, canned tomatoes, rice, pasta, and beans. Cooking  Cook foods with extra-virgin olive oil instead of using butter or other vegetable oils. Have meat as a side dish, and have vegetables or grains as your main dish. This means having meat in small portions or adding small amounts of meat to foods like pasta or stew. Use beans or vegetables instead of meat in common dishes like chili or lasagna. Experiment with different cooking methods. Try roasting or broiling vegetables instead of steaming or sauteing them. Add frozen vegetables to soups, stews, pasta, or rice. Add nuts or seeds for added healthy fat at each meal. You can add these to yogurt, salads, or vegetable dishes. Marinate fish or vegetables using olive oil, lemon juice, garlic, and fresh herbs. Meal planning  Plan to eat 1 vegetarian meal one day each week. Try to work up to 2 vegetarian meals, if possible. Eat seafood 2 or more times a week. Have healthy snacks readily available, such as: Vegetable sticks with hummus. Greek yogurt. Fruit and nut trail mix. Eat balanced meals throughout the week. This includes: Fruit: 2-3 servings a day Vegetables: 4-5 servings a day Low-fat dairy: 2 servings a day Fish, poultry, or lean meat: 1 serving a  day Beans and legumes: 2 or more servings a week Nuts and seeds: 1-2 servings a day Whole grains: 6-8 servings a day Extra-virgin olive oil: 3-4 servings a day Limit red meat and sweets to only a few servings a month What are my food choices? Mediterranean diet Recommended Grains: Whole-grain pasta. Brown rice. Bulgar wheat. Polenta. Couscous. Whole-wheat bread. [place]. Vegetables: Artichokes. Beets. Broccoli. Cabbage. Carrots. Eggplant. Green beans. Chard. Kale. Spinach. Onions. Leeks. Peas. Squash. Tomatoes. Peppers. Radishes. Fruits: Apples. Apricots. Avocado. Berries. Bananas. Cherries. Dates. Figs. Grapes. Lemons. Melon. Oranges. Peaches. Plums. Pomegranate. Meats and other protein foods: Beans. Almonds. Sunflower seeds. Pine nuts. Peanuts. [place]. Salmon. Scallops. Shrimp. Reserve. Tilapia. Clams. Oysters. Eggs. Dairy: Low-fat milk. Cheese. Greek yogurt. Beverages: Water. Red wine. Herbal tea. Fats and oils: Extra virgin olive oil. Avocado oil. Grape seed oil. Sweets and desserts: [place] yogurt with honey. Baked apples. Poached pears. Trail mix. Seasoning and other foods: Basil. Cilantro. Coriander. Cumin. Mint. Parsley. Sage. Rosemary. Tarragon. Garlic. Oregano. Thyme. Pepper. Balsalmic vinegar. Tahini. Hummus. Tomato sauce. Olives. Mushrooms. Limit these Grains: Prepackaged pasta or rice dishes. Prepackaged cereal with added sugar. Vegetables: Deep fried potatoes (french fries). Fruits: Fruit canned in syrup.  Meats and other protein foods: Beef. Pork. Lamb. Poultry with skin. Hot dogs. Berniece Salines. Dairy: Ice cream. Sour cream. Whole milk. Beverages: Juice. Sugar-sweetened soft drinks. Beer. Liquor and spirits. Fats and oils: Butter. Canola oil. Vegetable oil. Beef fat (tallow). Lard. Sweets and desserts: Cookies. Cakes. Pies. Candy. Seasoning and other foods: Mayonnaise. Premade sauces and marinades. The items listed may not be a complete list. Talk with your dietitian about what  dietary choices are right for you. Summary The Mediterranean diet includes both food and lifestyle choices. Eat a variety of fresh fruits and vegetables, beans, nuts, seeds, and whole grains. Limit the amount of red meat and sweets that you eat. Talk with your health care provider about whether it is safe for you to drink red wine in moderation. This means 1 glass a day for nonpregnant women and 2 glasses a day for men. A glass of wine equals 5 oz (150 mL). This information is not intended to replace advice given to you by your health care provider. Make sure you discuss any questions you have with your health care provider. Document Released: 09/02/2015 Document Revised: 10/05/2015 Document Reviewed: 09/02/2015 Elsevier Interactive Patient Education  2017 [employer].   We have sent a referral to [facility] for your MRI and they will call you directly to schedule your appointment. They are located at [place]. If you need to contact them directly please call [phone].

## 2021-09-01 NOTE — Progress Notes (Signed)
Assessment/Plan:   Memory Difficulties   [name] [name] is a very pleasant 66 y.o. RH female with a history of hypertension, hyperlipidemia, prediabetes, peripheral neuropathy, hypothyroidism, arthritis, depression, seen today for evaluation of memory loss.  The patient's last MoCA was 24/30.  However, her MRI of the brain was completely normal, does with very low suspicion of dementia.  There is possible, undiagnosed ADD-ADHD, as well as high anxiety which can contribute certainly to memory difficulties.     Recommendations:   Recommend behavioral therapy-psychotherapy, and further evaluation for ADD, HD ADD, and anxiety once established in [place], [place] where she is moving to be near her family No antidementia medication is indicated at this time Recommend good control of cardiovascular risk factors Recommend increasing activities No follow-up for memory disorder is indicated at this time   Case discussed with Dr. Delice Lesch who agrees with the plan   Subjective:   This patient is accompanied in the office by female who supplements the history.  Previous records as well as any outside records available were reviewed prior to todays visit.    Patient is currently on    Any changes in memory since last visit?  "Not better than before ".  She still has some trouble remembering recent conversations, or people's names, and has been experiencing problems multitasking.  However, she reports that this is not worse than in her prior visit. Patient lives with: Husband repeats oneself?  Endorsed Disoriented when walking into a room?  Endorsed.  She does not remember what she came to the room for.  Husband does the driving.   Leaving objects in unusual places?  Patient denies   Ambulates  with difficulty?   Patient denies   Recent falls?  Patient denies   Any head injuries?  Patient denies   History of seizures?   Patient denies   Wandering behavior?  Patient denies    Patient drives?   Patient no longer drives  Any mood changes since last visit?  Patient has high anxiety, but is excited about moving to [place] to be near her family, which she states that this is going to help. Any worsening depression?:  Patient denies.  She takes Zoloft, which seems to help. Hallucinations?  Patient denies   Paranoia?  Patient denies   Patient reports that  sleeps well without vivid dreams, REM behavior or sleepwalking   History of sleep apnea?  Patient denies   Any hygiene concerns?  Patient denies   Independent of bathing and dressing?  Endorsed  Does the patient needs help with medications?  Denies Who is in charge of the finances?   is in charge    Any changes in appetite?  Patient denies   Patient have trouble swallowing? Patient denies   Does the patient cook?  Patient denies   Any kitchen accidents such as leaving the stove on? Patient denies   Any headaches?  Patient denies   Double vision? Patient denies   Any focal numbness or tingling?  Patient denies   Chronic back pain Patient denies   Unilateral weakness?  Patient denies   Any tremors?  Patient denies   Any history of anosmia?  Patient denies   Any incontinence of urine?  Patient denies   Any bowel dysfunction?   Patient denies       Initial visit 07/15/21  The patient is seen in neurologic consultation at the request of Burns, Claudina Lick, MD for the evaluation of memory.  The patient is accompanied by her husband   who supplements the history.   How long did patient have memory difficulties? "For years, 10 years" more noticeable recently.  STM  worse than LTM She cannot remember recent conversations, especially over the last 2 yrs "nothing is there"-she says. For example, she cannot remember her daughter in law's name"but after a few mins it comes back to me"-she says. 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 Patient  lives with: Spouse, who reports that her changes are mild, perhaps "really it is about people's names ". She is also very anxious, she fidgets her knees more frequently". She tries to do crosswords, word findings, and tries to socialize to maintain her cognitive status.  repeats oneself? Denies  Disoriented when walking into a room?  Patient denies   Leaving objects in unusual places?  Patient denies   Ambulates  with difficulty?   Patient denies.  She goes to the Gym 3 x a week    Recent falls?  Patient denies   Any head injuries?  Patient denies   History of seizures?   Patient denies   Wandering behavior?  Patient denies   Patient drives?  No issues with driving  Any mood changes such irritability agitation? Irritability, since retiring she has to share a lot more time with her husband    Any history of depression?: Endorsed. She is on Zoloft and noticed some anxiety lately. Considering psychotherapy  Hallucinations?  Patient denies   Paranoia?  Patient denies   Patient reports that he sleeps well without vivid dreams, REM behavior or sleepwalking    History of sleep apnea?  Patient denies   Any hygiene concerns?  Patient denies   Independent of bathing and dressing?  Endorsed  Does the patient needs help with medications? Patient is in charge  Who is in charge of the finances?  Patient is in charge with her husband  Any changes in appetite?  Patient denies   Patient have trouble swallowing? Patient denies   Does the patient cook?  Patient denies   Any kitchen accidents such as leaving the stove on? Patient denies   Any headaches?  Patient denies   The double vision? Patient denies   Any focal numbness or tingling?  Patient denies   Chronic back pain  Endorsed, back surgery in Jan   Unilateral weakness?  Patient denies   Any tremors?  Patient denies   Any history of anosmia?  Patient denies   Any incontinence of urine?  Patient denies   Any bowel dysfunction?   Patient denies       History of heavy alcohol intake?  Patient denies   History of heavy tobacco use?  Patient denies   Family history of dementia? Mother died at 55 w Leukemia "but she was exactly like me before she died"  Past Medical History:  Diagnosis Date   Arthritis    Cholelithiasis    Dysrhythmia    Heart murmur    Hepatic steatosis    History of prediabetes    Hyperlipemia    Hypertension    Hypothyroidism    PONV (postoperative nausea and vomiting)    Pre-diabetes      Past Surgical History:  Procedure Laterality Date   ABDOMINAL HYSTERECTOMY     @ age 29, due to fibroids    COLONOSCOPY W/ POLYPECTOMY  01/24/2008   Dr.Jacobs,  due 2015    DILATION AND CURETTAGE OF UTERUS     JOINT REPLACEMENT     left knee   MOHS SURGERY  01/23/2002   Basal Cell    TONSILLECTOMY     TOTAL KNEE ARTHROPLASTY  09/22/2011   Dr Nicholes Stairs, Left;   TRANSFORAMINAL LUMBAR INTERBODY FUSION W/ MIS 1 LEVEL N/A 02/02/2021   Procedure: Lumbar three-four Minimally Invasive Transforaminal Lumbar Interbody Fusion;  Surgeon: [name], MD;  Location: [facility];  Service: Neurosurgery;  Laterality: N/A;     PREVIOUS MEDICATIONS:   CURRENT MEDICATIONS:  Outpatient Encounter Medications as of 09/01/2021  Medication Sig   azelastine (ASTELIN) 0.1 % nasal spray Place 2 sprays into both nostrils 2 (two) times daily.   busPIRone (BUSPAR) 5 MG tablet Take 1 tablet (5 mg total) by mouth 3 (three) times daily.   cholecalciferol (VITAMIN D3) 25 MCG (1000 UNIT) tablet Take 1,000 Units by mouth daily.   diclofenac (VOLTAREN) 75 MG EC tablet TAKE ONE TABLET BY MOUTH TWICE A DAY   ferrous sulfate 325 (65 FE) MG tablet Take 325 mg by mouth daily.   fluticasone (FLONASE) 50 MCG/ACT nasal spray Place 2 sprays into both nostrils daily. (Patient taking differently: Place 2 sprays into both nostrils daily as needed for allergies.)   icosapent Ethyl (VASCEPA) 1 g capsule Take 2 capsules (2 g total) by mouth 2 (two) times daily.    levothyroxine (SYNTHROID) 112 MCG tablet Take 1 tablet (112 mcg total) by mouth daily.   losartan (COZAAR) 100 MG tablet TAKE ONE TABLET BY MOUTH DAILY   metroNIDAZOLE (METROGEL) 1 % gel Apply 1 application topically in the morning and at bedtime.   Multiple Vitamins-Minerals (HAIR/SKIN/NAILS/BIOTIN PO) Take 1 tablet by mouth daily.   Polyvinyl Alcohol-Povidone PF (REFRESH) 1.4-0.6 % SOLN Place 1 drop into both eyes 3 (three) times daily as needed (dry eyes).   rosuvastatin (CRESTOR) 20 MG tablet TAKE ONE TABLET BY MOUTH DAILY - MUST CALL MD FOR APPOINTMENT   sertraline (ZOLOFT) 100 MG tablet TAKE 1 TABLET BY MOUTH DAILY   vitamin B-12 (CYANOCOBALAMIN) 1000 MCG tablet Take 1,000 mcg by mouth daily.   No facility-administered encounter medications on file as of 09/01/2021.     Objective:     PHYSICAL EXAMINATION:    VITALS:   Vitals:   09/01/21 0935  BP: (!) 142/79  Pulse: 97  Resp: 20  SpO2: 96%  Weight: 231 lb (104.8 kg)  Height: '5\' 6"'$  (1.676 m)    GEN:  The patient appears stated age and is in NAD. HEENT:  Normocephalic, atraumatic.   Neurological examination:  General: NAD, well-groomed, appears stated age. Orientation: The patient is alert. Oriented to person, place and date Cranial nerves: There is good facial symmetry.The speech is fluent and clear. No aphasia or dysarthria. Fund of knowledge is appropriate. Recent memory impaired and remote memory is normal.  Attention and concentration are normal.  Able to name objects and repeat phrases.  Hearing is intact to conversational tone.    Sensation: Sensation is intact to light touch throughout Motor: Strength is at least antigravity x4. Tremors: none  DTR's 2/4 in UE/LE      07/17/2021    7:00 PM  Montreal Cognitive Assessment   Visuospatial/ Executive (0/5) 5  Naming (0/3) 3  Attention: Read list of digits (0/2) 2  Attention: Read list of letters (0/1) 1  Attention: Serial 7 subtraction starting at 100 (0/3) 1   Language: Repeat phrase (0/2)  2  Language : Fluency (0/1) 1  Abstraction (0/2) 1  Delayed Recall (0/5) 3  Orientation (0/6) 5  Total 24  Adjusted Score (based on education) 24        No data to display             Movement examination: Tone: There is normal tone in the UE/LE Abnormal movements:  no tremor.  No myoclonus.  No asterixis.   Coordination:  There is no decremation with RAM's. Normal finger to nose  Gait and Station: The patient has no difficulty arising out of a deep-seated chair without the use of the hands. The patient's stride length is good.  Gait is cautious and narrow.   Thank you for allowing [place] the opportunity to participate in the care of this nice patient. Please do not hesitate to contact us for any questions or concerns.   Total time spent on today's visit was 30 minutes dedicated to this patient today, preparing to see patient, examining the patient, ordering tests and/or medications and counseling the patient, documenting clinical information in the EHR or other health record, independently interpreting results and communicating results to the patient/family, discussing treatment and goals, answering patient's questions and coordinating care.  Cc:  Binnie Rail, MD  Sharene Butters 09/01/2021 2:46 PM

## 2021-09-21 DIAGNOSIS — M47816 Spondylosis without myelopathy or radiculopathy, lumbar region: Secondary | ICD-10-CM | POA: Diagnosis not present

## 2021-09-28 ENCOUNTER — Other Ambulatory Visit: Payer: Self-pay | Admitting: Internal Medicine

## 2021-10-10 DIAGNOSIS — D3611 Benign neoplasm of peripheral nerves and autonomic nervous system of face, head, and neck: Secondary | ICD-10-CM | POA: Diagnosis not present

## 2021-10-10 DIAGNOSIS — L814 Other melanin hyperpigmentation: Secondary | ICD-10-CM | POA: Diagnosis not present

## 2021-10-10 DIAGNOSIS — L648 Other androgenic alopecia: Secondary | ICD-10-CM | POA: Diagnosis not present

## 2021-10-10 DIAGNOSIS — Z85828 Personal history of other malignant neoplasm of skin: Secondary | ICD-10-CM | POA: Diagnosis not present

## 2021-10-10 DIAGNOSIS — L570 Actinic keratosis: Secondary | ICD-10-CM | POA: Diagnosis not present

## 2021-10-10 DIAGNOSIS — L578 Other skin changes due to chronic exposure to nonionizing radiation: Secondary | ICD-10-CM | POA: Diagnosis not present

## 2021-10-10 DIAGNOSIS — D485 Neoplasm of uncertain behavior of skin: Secondary | ICD-10-CM | POA: Diagnosis not present

## 2021-10-14 ENCOUNTER — Ambulatory Visit: Payer: Medicare Other

## 2021-10-19 ENCOUNTER — Ambulatory Visit (INDEPENDENT_AMBULATORY_CARE_PROVIDER_SITE_OTHER): Payer: Medicare Other

## 2021-10-19 DIAGNOSIS — Z23 Encounter for immunization: Secondary | ICD-10-CM | POA: Diagnosis not present

## 2021-10-19 NOTE — Progress Notes (Signed)
After obtaining consent, and per orders of Dr. Quay Burow, injection of High Flu given in the left deltoid by Marrian Salvage. Patient tolerated well and instructed to report any adverse reaction to me immediately.

## 2021-10-27 DIAGNOSIS — M47816 Spondylosis without myelopathy or radiculopathy, lumbar region: Secondary | ICD-10-CM | POA: Diagnosis not present

## 2021-12-05 ENCOUNTER — Telehealth: Payer: Medicare Other | Admitting: Nurse Practitioner

## 2021-12-05 DIAGNOSIS — J40 Bronchitis, not specified as acute or chronic: Secondary | ICD-10-CM | POA: Diagnosis not present

## 2021-12-05 MED ORDER — BENZONATATE 100 MG PO CAPS: 100.0000 mg | ORAL_CAPSULE | Freq: Three times a day (TID) | ORAL | 0 refills | Status: DC | PRN

## 2021-12-05 MED ORDER — PREDNISONE 20 MG PO TABS: 20.0000 mg | ORAL_TABLET | Freq: Every day | ORAL | 0 refills | Status: AC

## 2021-12-05 MED ORDER — AZITHROMYCIN 250 MG PO TABS: ORAL_TABLET | ORAL | 0 refills | Status: AC

## 2021-12-05 NOTE — Progress Notes (Signed)
We are sorry that you are not feeling well.  Here is how we plan to help!  Based on your presentation I believe you most likely have A cough due to bacteria.  When patients have a fever and a productive cough with a change in color or increased sputum production, we are concerned about bacterial bronchitis.  If left untreated it can progress to pneumonia.  If your symptoms do not improve with your treatment plan it is important that you contact your provider.   I have prescribed Azithromyin 250 mg: two tablets now and then one tablet daily for 4 additonal days    In addition you may use A prescription cough medication called Tessalon Perles '100mg'$ . You may take 1-2 capsules every 8 hours as needed for your cough.  Prednisone 20 mg daily for 5 days   From your responses in the eVisit questionnaire you describe inflammation in the upper respiratory tract which is causing a significant cough.  This is commonly called Bronchitis and has four common causes:   Allergies Viral Infections Acid Reflux Bacterial Infection Allergies, viruses and acid reflux are treated by controlling symptoms or eliminating the cause. An example might be a cough caused by taking certain blood pressure medications. You stop the cough by changing the medication. Another example might be a cough caused by acid reflux. Controlling the reflux helps control the cough.  USE OF BRONCHODILATOR ("RESCUE") INHALERS: There is a risk from using your bronchodilator too frequently.  The risk is that over-reliance on a medication which only relaxes the muscles surrounding the breathing tubes can reduce the effectiveness of medications prescribed to reduce swelling and congestion of the tubes themselves.  Although you feel brief relief from the bronchodilator inhaler, your asthma may actually be worsening with the tubes becoming more swollen and filled with mucus.  This can delay other crucial treatments, such as oral steroid medications. If you  need to use a bronchodilator inhaler daily, several times per day, you should discuss this with your provider.  There are probably better treatments that could be used to keep your asthma under control.     HOME CARE Only take medications as instructed by your medical team. Complete the entire course of an antibiotic. Drink plenty of fluids and get plenty of rest. Avoid close contacts especially the very young and the elderly Cover your mouth if you cough or cough into your sleeve. Always remember to wash your hands A steam or ultrasonic humidifier can help congestion.   GET HELP RIGHT AWAY IF: You develop worsening fever. You become short of breath You cough up blood. Your symptoms persist after you have completed your treatment plan MAKE SURE YOU  Understand these instructions. Will watch your condition. Will get help right away if you are not doing well or get worse.    Thank you for choosing an e-visit.  Your e-visit answers were reviewed by a board certified advanced clinical practitioner to complete your personal care plan. Depending upon the condition, your plan could have included both over the counter or prescription medications.  Please review your pharmacy choice. Make sure the pharmacy is open so you can pick up prescription now. If there is a problem, you may contact your provider through [employer] and have the prescription routed to another pharmacy.  Your safety is important to [place]. If you have drug allergies check your prescription carefully.   For the next 24 hours you can use MyChart to ask questions about today's  visit, request a non-urgent call back, or ask for a work or school excuse. You will get an email in the next two days asking about your experience. I hope that your e-visit has been valuable and will speed your recovery.   Meds ordered this encounter  Medications   predniSONE (DELTASONE) 20 MG tablet    Sig: Take 1 tablet (20 mg total) by mouth  daily with breakfast for 5 days.    Dispense:  5 tablet    Refill:  0   azithromycin (ZITHROMAX) 250 MG tablet    Sig: Take 2 tablets on day 1, then 1 tablet daily on days 2 through 5    Dispense:  6 tablet    Refill:  0   benzonatate (TESSALON) 100 MG capsule    Sig: Take 1 capsule (100 mg total) by mouth 3 (three) times daily as needed.    Dispense:  30 capsule    Refill:  0     I spent approximately 5 minutes reviewing the patient's history, current symptoms and coordinating their plan of care today.

## 2021-12-09 ENCOUNTER — Other Ambulatory Visit: Payer: Self-pay | Admitting: Internal Medicine

## 2021-12-19 DIAGNOSIS — M25561 Pain in right knee: Secondary | ICD-10-CM | POA: Diagnosis not present

## 2022-01-02 DIAGNOSIS — M1711 Unilateral primary osteoarthritis, right knee: Secondary | ICD-10-CM | POA: Diagnosis not present

## 2022-01-03 NOTE — Progress Notes (Unsigned)
Subjective:    Patient ID: [name] [name], female    DOB: [birth year], 66 y.o.   MRN: [id number]      HPI [name] [name] is here for a Physical exam.    Memory issues - sara - ? Related to undiagnosed ADD, anxiety.  She will be moving to [place] at some point, but maybe not to until the spring     Medications and allergies reviewed with patient and updated if appropriate.  Current Outpatient Medications on File Prior to Visit  Medication Sig Dispense Refill   azelastine (ASTELIN) 0.1 % nasal spray Place 2 sprays into both nostrils 2 (two) times daily. 30 mL 12   benzonatate (TESSALON) 100 MG capsule Take 1 capsule (100 mg total) by mouth 3 (three) times daily as needed. 30 capsule 0   busPIRone (BUSPAR) 5 MG tablet TAKE ONE TABLET BY MOUTH THREE TIMES A DAY 90 tablet 2   cholecalciferol (VITAMIN D3) 25 MCG (1000 UNIT) tablet Take 1,000 Units by mouth daily.     ferrous sulfate 325 (65 FE) MG tablet Take 325 mg by mouth daily.     icosapent Ethyl (VASCEPA) 1 g capsule Take 2 capsules (2 g total) by mouth 2 (two) times daily. 120 capsule 5   levothyroxine (SYNTHROID) 112 MCG tablet Take 1 tablet (112 mcg total) by mouth daily. 90 tablet 3   losartan (COZAAR) 100 MG tablet TAKE ONE TABLET BY MOUTH DAILY 90 tablet 1   Multiple Vitamins-Minerals (HAIR/SKIN/NAILS/BIOTIN PO) Take 1 tablet by mouth daily.     Polyvinyl Alcohol-Povidone PF (REFRESH) 1.4-0.6 % SOLN Place 1 drop into both eyes 3 (three) times daily as needed (dry eyes).     rosuvastatin (CRESTOR) 20 MG tablet TAKE ONE TABLET BY MOUTH DAILY - MUST CALL MD FOR APPOINTMENT 90 tablet 2   sertraline (ZOLOFT) 100 MG tablet Take 1 tablet (100 mg total) by mouth daily. Pls keep 01/04/22 appt for future refills 30 tablet 0   vitamin B-12 (CYANOCOBALAMIN) 1000 MCG tablet Take 1,000 mcg by mouth daily.     diclofenac (VOLTAREN) 75 MG EC tablet TAKE ONE TABLET BY MOUTH TWICE A DAY (Patient not taking: Reported on 01/04/2022) 60 tablet  3   fluticasone (FLONASE) 50 MCG/ACT nasal spray Place 2 sprays into both nostrils daily. (Patient not taking: Reported on 01/04/2022) 16 g 1   metroNIDAZOLE (METROGEL) 1 % gel Apply 1 application  topically in the morning and at bedtime. (Patient not taking: Reported on 01/04/2022)     No current facility-administered medications on file prior to visit.    Review of Systems  Constitutional:  Negative for fever.  Eyes:  Negative for visual disturbance.  Respiratory:  Negative for cough, shortness of breath and wheezing.   Cardiovascular:  Negative for chest pain, palpitations and leg swelling.  Gastrointestinal:  Positive for constipation (mild). Negative for abdominal pain, blood in stool, diarrhea and nausea.       No gerd  Genitourinary:  Negative for dysuria.  Musculoskeletal:  Positive for arthralgias (right knee) and back pain (occ back pain).  Skin:  Negative for rash.  Neurological:  Negative for light-headedness and headaches.  Psychiatric/Behavioral:  Negative for dysphoric mood. The patient is nervous/anxious.        Objective:   Vitals:   01/04/22 0939  BP: 130/84  Pulse: 97  Temp: 98 F (36.7 C)  SpO2: 98%   Filed Weights   01/04/22 0939  Weight: 228 lb (103.4 kg)  Body mass index is 36.8 kg/m.  BP Readings from Last 3 Encounters:  01/04/22 130/84  09/01/21 (!) 142/79  08/01/21 (!) 107/45    Wt Readings from Last 3 Encounters:  01/04/22 228 lb (103.4 kg)  09/01/21 231 lb (104.8 kg)  08/01/21 230 lb (104.3 kg)       Physical Exam Constitutional: She appears well-developed and well-nourished. No distress.  HENT:  Head: Normocephalic and atraumatic.  Right Ear: External ear normal. Normal ear canal and TM Left Ear: External ear normal.  Normal ear canal and TM Mouth/Throat: Oropharynx is clear and moist.  Eyes: Conjunctivae normal.  Neck: Neck supple. No tracheal deviation present. No thyromegaly present.  No carotid bruit  Cardiovascular:  Normal rate, regular rhythm and normal heart sounds.   1/2 sys murmur heard.  No edema. Pulmonary/Chest: Effort normal and breath sounds normal. No respiratory distress. She has no wheezes. She has no rales.  Breast: deferred   Abdominal: Soft. She exhibits no distension. There is no tenderness.  Lymphadenopathy: She has no cervical adenopathy.  Skin: Skin is warm and dry. She is not diaphoretic.  Psychiatric: She has a normal mood and affect. Her behavior is normal.     Lab Results  Component Value Date   WBC 8.6 07/04/2021   HGB 15.3 (H) 07/04/2021   HCT 46.1 (H) 07/04/2021   PLT 306.0 07/04/2021   GLUCOSE 102 (H) 07/04/2021   CHOL 184 07/04/2021   TRIG 340.0 (H) 07/04/2021   HDL 39.30 07/04/2021   LDLDIRECT 108.0 07/04/2021   LDLCALC 88 09/30/2020   ALT 19 07/04/2021   AST 19 07/04/2021   NA 137 07/04/2021   K 4.2 07/04/2021   CL 100 07/04/2021   CREATININE 0.85 07/04/2021   BUN 14 07/04/2021   CO2 29 07/04/2021   TSH 0.97 07/04/2021   INR 1.00 09/19/2011   HGBA1C 6.5 07/04/2021   MICROALBUR <0.7 07/04/2021         Assessment & Plan:   Physical exam: Screening blood work  ordered Exercise  gym 2-3 times a week Weight encouraged weight loss-she is trying, but has not been successful Substance abuse  none   Reviewed recommended immunizations.   Health Maintenance  Topic Date Due   FOOT EXAM  Never done   OPHTHALMOLOGY EXAM  06/20/2021   HEMOGLOBIN A1C  01/03/2022   COVID-19 Vaccine (5 - 2023-24 season) 01/20/2022 (Originally 09/23/2021)   Zoster Vaccines- Shingrix (1 of 2) 04/05/2022 (Originally 05/02/2005)   MAMMOGRAM  05/29/2022   Diabetic kidney evaluation - eGFR measurement  07/05/2022   Diabetic kidney evaluation - Urine ACR  07/05/2022   Medicare Annual Wellness (AWV)  01/05/2023   DTaP/Tdap/Td (3 - Td or Tdap) 03/14/2025   DEXA SCAN  09/21/2025   COLONOSCOPY (Pts 45-79yr Insurance coverage will need to be confirmed)  08/01/2028   Pneumonia  Vaccine 66 Years old  Completed   INFLUENZA VACCINE  Completed   Hepatitis C Screening  Completed   HPV VACCINES  Aged Out          See Problem List for Assessment and Plan of chronic medical problems.

## 2022-01-03 NOTE — Patient Instructions (Addendum)
Call dermatology about your Craniofaical hyperhydrosis   Blood work was ordered.   The lab is on the first floor.    Medications changes include :   none    A referral was ordered for neuropsychology.     Someone will call you to schedule an appointment.    Return in about 6 months (around 07/06/2022) for follow up.   Health Maintenance, Female Adopting a healthy lifestyle and getting preventive care are important in promoting health and wellness. Ask your health care provider about: The right schedule for you to have regular tests and exams. Things you can do on your own to prevent diseases and keep yourself healthy. What should I know about diet, weight, and exercise? Eat a healthy diet  Eat a diet that includes plenty of vegetables, fruits, low-fat dairy products, and lean protein. Do not eat a lot of foods that are high in solid fats, added sugars, or sodium. Maintain a healthy weight Body mass index (BMI) is used to identify weight problems. It estimates body fat based on height and weight. Your health care provider can help determine your BMI and help you achieve or maintain a healthy weight. Get regular exercise Get regular exercise. This is one of the most important things you can do for your health. Most adults should: Exercise for at least 150 minutes each week. The exercise should increase your heart rate and make you sweat (moderate-intensity exercise). Do strengthening exercises at least twice a week. This is in addition to the moderate-intensity exercise. Spend less time sitting. Even light physical activity can be beneficial. Watch cholesterol and blood lipids Have your blood tested for lipids and cholesterol at 66 years of age, then have this test every 5 years. Have your cholesterol levels checked more often if: Your lipid or cholesterol levels are high. You are older than 66 years of age. You are at high risk for heart disease. What should I know about  cancer screening? Depending on your health history and family history, you may need to have cancer screening at various ages. This may include screening for: Breast cancer. Cervical cancer. Colorectal cancer. Skin cancer. Lung cancer. What should I know about heart disease, diabetes, and high blood pressure? Blood pressure and heart disease High blood pressure causes heart disease and increases the risk of stroke. This is more likely to develop in people who have high blood pressure readings or are overweight. Have your blood pressure checked: Every 3-5 years if you are 33-43 years of age. Every year if you are 11 years old or older. Diabetes Have regular diabetes screenings. This checks your fasting blood sugar level. Have the screening done: Once every three years after age 49 if you are at a normal weight and have a low risk for diabetes. More often and at a younger age if you are overweight or have a high risk for diabetes. What should I know about preventing infection? Hepatitis B If you have a higher risk for hepatitis B, you should be screened for this virus. Talk with your health care provider to find out if you are at risk for hepatitis B infection. Hepatitis C Testing is recommended for: Everyone born from [birth year] through 1965. Anyone with known risk factors for hepatitis C. Sexually transmitted infections (STIs) Get screened for STIs, including gonorrhea and chlamydia, if: You are sexually active and are younger than 66 years of age. You are older than 66 years of age and your health care  provider tells you that you are at risk for this type of infection. Your sexual activity has changed since you were last screened, and you are at increased risk for chlamydia or gonorrhea. Ask your health care provider if you are at risk. Ask your health care provider about whether you are at high risk for HIV. Your health care provider may recommend a prescription medicine to help prevent HIV  infection. If you choose to take medicine to prevent HIV, you should first get tested for HIV. You should then be tested every 3 months for as long as you are taking the medicine. Pregnancy If you are about to stop having your period (premenopausal) and you may become pregnant, seek counseling before you get pregnant. Take 400 to 800 micrograms (mcg) of folic acid every day if you become pregnant. Ask for birth control (contraception) if you want to prevent pregnancy. Osteoporosis and menopause Osteoporosis is a disease in which the bones lose minerals and strength with aging. This can result in bone fractures. If you are 60 years old or older, or if you are at risk for osteoporosis and fractures, ask your health care provider if you should: Be screened for bone loss. Take a calcium or vitamin D supplement to lower your risk of fractures. Be given hormone replacement therapy (HRT) to treat symptoms of menopause. Follow these instructions at home: Alcohol use Do not drink alcohol if: Your health care provider tells you not to drink. You are pregnant, may be pregnant, or are planning to become pregnant. If you drink alcohol: Limit how much you have to: 0-1 drink a day. Know how much alcohol is in your drink. In the [place] one drink equals one 12 oz bottle of beer (355 mL), one 5 oz glass of wine (148 mL), or one 1 oz glass of hard liquor (44 mL). Lifestyle Do not use any products that contain nicotine or tobacco. These products include cigarettes, chewing tobacco, and vaping devices, such as e-cigarettes. If you need help quitting, ask your health care provider. Do not use street drugs. Do not share needles. Ask your health care provider for help if you need support or information about quitting drugs. General instructions Schedule regular health, dental, and eye exams. Stay current with your vaccines. Tell your health care provider if: You often feel depressed. You have ever been abused  or do not feel safe at home. Summary Adopting a healthy lifestyle and getting preventive care are important in promoting health and wellness. Follow your health care provider's instructions about healthy diet, exercising, and getting tested or screened for diseases. Follow your health care provider's instructions on monitoring your cholesterol and blood pressure. This information is not intended to replace advice given to you by your health care provider. Make sure you discuss any questions you have with your health care provider. Document Revised: 05/31/2020 Document Reviewed: 05/31/2020 Elsevier Patient Education  [place].

## 2022-01-04 ENCOUNTER — Encounter: Payer: Self-pay | Admitting: Internal Medicine

## 2022-01-04 ENCOUNTER — Ambulatory Visit (INDEPENDENT_AMBULATORY_CARE_PROVIDER_SITE_OTHER): Payer: Medicare Other | Admitting: *Deleted

## 2022-01-04 ENCOUNTER — Ambulatory Visit (INDEPENDENT_AMBULATORY_CARE_PROVIDER_SITE_OTHER): Payer: Medicare Other | Admitting: Internal Medicine

## 2022-01-04 VITALS — BP 130/84 | HR 97 | Temp 98.0°F | Ht 66.0 in | Wt 228.0 lb

## 2022-01-04 DIAGNOSIS — E039 Hypothyroidism, unspecified: Secondary | ICD-10-CM | POA: Diagnosis not present

## 2022-01-04 DIAGNOSIS — Z0000 Encounter for general adult medical examination without abnormal findings: Secondary | ICD-10-CM

## 2022-01-04 DIAGNOSIS — M1711 Unilateral primary osteoarthritis, right knee: Secondary | ICD-10-CM | POA: Diagnosis not present

## 2022-01-04 DIAGNOSIS — E7849 Other hyperlipidemia: Secondary | ICD-10-CM | POA: Diagnosis not present

## 2022-01-04 DIAGNOSIS — E611 Iron deficiency: Secondary | ICD-10-CM | POA: Diagnosis not present

## 2022-01-04 DIAGNOSIS — I10 Essential (primary) hypertension: Secondary | ICD-10-CM | POA: Diagnosis not present

## 2022-01-04 DIAGNOSIS — E119 Type 2 diabetes mellitus without complications: Secondary | ICD-10-CM

## 2022-01-04 DIAGNOSIS — F09 Unspecified mental disorder due to known physiological condition: Secondary | ICD-10-CM

## 2022-01-04 DIAGNOSIS — G4733 Obstructive sleep apnea (adult) (pediatric): Secondary | ICD-10-CM | POA: Diagnosis not present

## 2022-01-04 DIAGNOSIS — R61 Generalized hyperhidrosis: Secondary | ICD-10-CM | POA: Diagnosis not present

## 2022-01-04 DIAGNOSIS — K219 Gastro-esophageal reflux disease without esophagitis: Secondary | ICD-10-CM | POA: Diagnosis not present

## 2022-01-04 DIAGNOSIS — F411 Generalized anxiety disorder: Secondary | ICD-10-CM

## 2022-01-04 DIAGNOSIS — Z6836 Body mass index (BMI) 36.0-36.9, adult: Secondary | ICD-10-CM

## 2022-01-04 DIAGNOSIS — E559 Vitamin D deficiency, unspecified: Secondary | ICD-10-CM

## 2022-01-04 HISTORY — DX: Unilateral primary osteoarthritis, right knee: M17.11

## 2022-01-04 LAB — COMPREHENSIVE METABOLIC PANEL
ALT: 20 U/L (ref 0–35)
AST: 21 U/L (ref 0–37)
Albumin: 4.6 g/dL (ref 3.5–5.2)
Alkaline Phosphatase: 61 U/L (ref 39–117)
BUN: 14 mg/dL (ref 6–23)
CO2: 31 mEq/L (ref 19–32)
Calcium: 10 mg/dL (ref 8.4–10.5)
Chloride: 103 mEq/L (ref 96–112)
Creatinine, Ser: 0.88 mg/dL (ref 0.40–1.20)
GFR: 68.36 mL/min (ref >60.00)
Glucose, Bld: 111 mg/dL — ABNORMAL HIGH (ref 70–99)
Potassium: 4.6 mEq/L (ref 3.5–5.1)
Sodium: 142 mEq/L (ref 135–145)
Total Bilirubin: 0.5 mg/dL (ref 0.2–1.2)
Total Protein: 7.2 g/dL (ref 6.0–8.3)

## 2022-01-04 LAB — CBC WITH DIFFERENTIAL/PLATELET
Basophils Absolute: 0.1 10*3/uL (ref 0.0–0.1)
Basophils Relative: 0.8 % (ref 0.0–3.0)
Eosinophils Absolute: 0.2 10*3/uL (ref 0.0–0.7)
Eosinophils Relative: 2.2 % (ref 0.0–5.0)
HCT: 49.1 % — ABNORMAL HIGH (ref 36.0–46.0)
Hemoglobin: 16.3 g/dL — ABNORMAL HIGH (ref 12.0–15.0)
Lymphocytes Relative: 22.2 % (ref 12.0–46.0)
Lymphs Abs: 1.8 10*3/uL (ref 0.7–4.0)
MCHC: 33.3 g/dL (ref 30.0–36.0)
MCV: 89.4 fl (ref 78.0–100.0)
Monocytes Absolute: 0.5 10*3/uL (ref 0.1–1.0)
Monocytes Relative: 6.8 % (ref 3.0–12.0)
Neutro Abs: 5.4 10*3/uL (ref 1.4–7.7)
Neutrophils Relative %: 68 % (ref 43.0–77.0)
Platelets: 328 10*3/uL (ref 150.0–400.0)
RBC: 5.48 Mil/uL — ABNORMAL HIGH (ref 3.87–5.11)
RDW: 13.9 % (ref 11.5–15.5)
WBC: 8 10*3/uL (ref 4.0–10.5)

## 2022-01-04 LAB — VITAMIN D 25 HYDROXY (VIT D DEFICIENCY, FRACTURES): VITD: 47.77 ng/mL (ref 30.00–100.00)

## 2022-01-04 LAB — LIPID PANEL
Cholesterol: 159 mg/dL (ref 0–200)
HDL: 44.4 mg/dL (ref >39.00)
NonHDL: 114.51
Total CHOL/HDL Ratio: 4
Triglycerides: 207 mg/dL — ABNORMAL HIGH (ref 0.0–149.0)
VLDL: 41.4 mg/dL — ABNORMAL HIGH (ref 0.0–40.0)

## 2022-01-04 LAB — LDL CHOLESTEROL, DIRECT: Direct LDL: 94 mg/dL

## 2022-01-04 LAB — IBC PANEL
Iron: 97 ug/dL (ref 42–145)
Saturation Ratios: 27.9 % (ref 20.0–50.0)
TIBC: 347.2 ug/dL (ref 250.0–450.0)
Transferrin: 248 mg/dL (ref 212.0–360.0)

## 2022-01-04 LAB — TSH: TSH: 1.9 u[IU]/mL (ref 0.35–5.50)

## 2022-01-04 LAB — HEMOGLOBIN A1C: Hgb A1c MFr Bld: 6.6 % — ABNORMAL HIGH (ref 4.6–6.5)

## 2022-01-04 LAB — FERRITIN: Ferritin: 156.4 ng/mL (ref 10.0–291.0)

## 2022-01-04 MED ORDER — FERROUS SULFATE 27 MG PO TABS: ORAL_TABLET | ORAL | 0 refills | Status: DC

## 2022-01-04 MED ORDER — DICLOFENAC SODIUM 75 MG PO TBEC: 75.0000 mg | DELAYED_RELEASE_TABLET | Freq: Two times a day (BID) | ORAL | 3 refills | Status: DC | PRN

## 2022-01-04 NOTE — Assessment & Plan Note (Addendum)
Chronic Not currently on any treatment  encouraged weight loss Encouraged reevaluation-discussed that her elevated H/H may be related to sleep apnea Discussed potential consequences of untreated sleep apnea

## 2022-01-04 NOTE — Assessment & Plan Note (Signed)
Chronic Has been working on weight loss but has been unsuccessful She is exercising 2-3 times a week and will work on increasing that Discussed the importance of cutting back on calories Advised eating a diet high in fiber, protein and vegetables

## 2022-01-04 NOTE — Patient Instructions (Signed)

## 2022-01-04 NOTE — Assessment & Plan Note (Signed)
Chronic Taking vitamin D daily Check vitamin D level  

## 2022-01-04 NOTE — Progress Notes (Signed)
Subjective:   [name] [name] is a 66 y.o. female who presents for an Initial Medicare Annual Wellness Visit. I connected with  [name] on 01/04/22 by a audio enabled telemedicine application and verified that I am speaking with the correct person using two identifiers.  Patient Location: Home  Provider Location: Home Office  I discussed the limitations of evaluation and management by telemedicine. The patient expressed understanding and agreed to proceed.  Review of Systems    Deferred to PCP Cardiac Risk Factors include: advanced age (>4mn, >>7women);hypertension;dyslipidemia     Objective:    Today's Vitals   01/04/22 0834  PainSc: 2    There is no height or weight on file to calculate BMI.     01/04/2022    8:40 AM 09/01/2021    9:31 AM 07/15/2021   10:00 AM 01/31/2021   10:35 AM 01/04/2020    2:04 AM 09/22/2011    6:00 PM 09/19/2011    2:35 PM  Advanced Directives  Does Patient Have a Medical Advance Directive? Yes Yes Yes Yes No Patient has advance directive, copy not in chart Patient has advance directive, copy not in chart  Type of Advance Directive [place] will   [place] will  [place]St. Ann High[place]Living will [place]Living will  Does patient want to make changes to medical advance directive? No - Patient declined        Copy of [place] Chart? No - copy requested     Copy requested from family Copy requested from family  Would patient like information on creating a medical advance directive?     No - Patient declined    Pre-existing out of facility DNR order (yellow form or pink MOST form)      No No    Current Medications (verified) Outpatient Encounter Medications as of 01/04/2022  Medication Sig   azelastine (ASTELIN) 0.1 % nasal spray Place 2 sprays into both nostrils 2 (two) times daily.   benzonatate (TESSALON) 100 MG capsule Take  1 capsule (100 mg total) by mouth 3 (three) times daily as needed.   busPIRone (BUSPAR) 5 MG tablet TAKE ONE TABLET BY MOUTH THREE TIMES A DAY   cholecalciferol (VITAMIN D3) 25 MCG (1000 UNIT) tablet Take 1,000 Units by mouth daily.   ferrous sulfate 325 (65 FE) MG tablet Take 325 mg by mouth daily.   fluticasone (FLONASE) 50 MCG/ACT nasal spray Place 2 sprays into both nostrils daily. (Patient taking differently: Place 2 sprays into both nostrils daily as needed for allergies.)   icosapent Ethyl (VASCEPA) 1 g capsule Take 2 capsules (2 g total) by mouth 2 (two) times daily.   levothyroxine (SYNTHROID) 112 MCG tablet Take 1 tablet (112 mcg total) by mouth daily.   losartan (COZAAR) 100 MG tablet TAKE ONE TABLET BY MOUTH DAILY   Multiple Vitamins-Minerals (HAIR/SKIN/NAILS/BIOTIN PO) Take 1 tablet by mouth daily.   Polyvinyl Alcohol-Povidone PF (REFRESH) 1.4-0.6 % SOLN Place 1 drop into both eyes 3 (three) times daily as needed (dry eyes).   rosuvastatin (CRESTOR) 20 MG tablet TAKE ONE TABLET BY MOUTH DAILY - MUST CALL MD FOR APPOINTMENT   sertraline (ZOLOFT) 100 MG tablet Take 1 tablet (100 mg total) by mouth daily. Pls keep 01/04/22 appt for future refills   vitamin B-12 (CYANOCOBALAMIN) 1000 MCG tablet Take 1,000 mcg by mouth daily.   diclofenac (VOLTAREN) 75 MG EC tablet TAKE ONE TABLET  BY MOUTH TWICE A DAY (Patient not taking: Reported on 01/04/2022)   metroNIDAZOLE (METROGEL) 1 % gel Apply 1 application topically in the morning and at bedtime. (Patient not taking: Reported on 01/04/2022)   No facility-administered encounter medications on file as of 01/04/2022.    Allergies (verified) Ampicillin, Doxycycline, Effexor [venlafaxine], Oxycodone, and Penicillins   History: Past Medical History:  Diagnosis Date   Arthritis    Cholelithiasis    Dysrhythmia    Heart murmur    Hepatic steatosis    History of prediabetes    Hyperlipemia    Hypertension    Hypothyroidism    PONV  (postoperative nausea and vomiting)    Pre-diabetes    Past Surgical History:  Procedure Laterality Date   ABDOMINAL HYSTERECTOMY     @ age 37, due to fibroids    COLONOSCOPY W/ POLYPECTOMY  01/24/2008   Dr.Jacobs, due 2015    DILATION AND CURETTAGE OF UTERUS     JOINT REPLACEMENT     left knee   MOHS SURGERY  01/23/2002   Basal Cell    TONSILLECTOMY     TOTAL KNEE ARTHROPLASTY  09/22/2011   Dr Nicholes Stairs, Left;   TRANSFORAMINAL LUMBAR INTERBODY FUSION W/ MIS 1 LEVEL N/A 02/02/2021   Procedure: Lumbar three-four Minimally Invasive Transforaminal Lumbar Interbody Fusion;  Surgeon: [name], MD;  Location: [facility];  Service: Neurosurgery;  Laterality: N/A;   Family History  Problem Relation Age of Onset   Diabetes Mother    Leukemia Mother    Alcohol abuse Father    Lung cancer Father        smoker   Diabetes Brother    Heart attack Maternal Uncle        X3 ; MIs in 62s & 31s   Stroke Maternal Uncle        in his 59s   Adrenal disorder Neg Hx    Colon cancer Neg Hx    Colon polyps Neg Hx    Esophageal cancer Neg Hx    Rectal cancer Neg Hx    Stomach cancer Neg Hx    Social History   Socioeconomic History   Marital status: Married    Spouse name: Charles   Number of children: 2   Years of education: 14   Highest education level: Not on file  Occupational History   Not on file  Tobacco Use   Smoking status: Never   Smokeless tobacco: Never  Vaping Use   Vaping Use: Never used  Substance and Sexual Activity   Alcohol use: Yes    Comment: rare   Drug use: No   Sexual activity: Yes  Other Topics Concern   Not on file  Social History Narrative   Not exercising regularly   Right handed   Caffeine yes   Two story home    Social Determinants of Health   Financial Resource Strain: Low Risk  (01/04/2022)   Overall Financial Resource Strain (CARDIA)    Difficulty of Paying Living Expenses: Not hard at all  Food Insecurity: No Food Insecurity (01/04/2022)    Hunger Vital Sign    Worried About Running Out of Food in the Last Year: Never true    Ran Out of Food in the Last Year: Never true  Transportation Needs: No Transportation Needs (01/04/2022)   PRAPARE - Hydrologist (Medical): No    Lack of Transportation (Non-Medical): No  Physical Activity: Sufficiently Active (01/04/2022)  Exercise Vital Sign    Days of Exercise per Week: 3 days    Minutes of Exercise per Session: 60 min  Stress: No Stress Concern Present (01/04/2022)   [place]    Feeling of Stress : Not at all  Social Connections: Moderately Isolated (01/04/2022)   Social Connection and Isolation Panel [NHANES]    Frequency of Communication with Friends and Family: More than three times a week    Frequency of Social Gatherings with Friends and Family: More than three times a week    Attends Religious Services: Never    Marine scientist or Organizations: No    Attends Music therapist: Never    Marital Status: Married    Tobacco Counseling Counseling given: Not Answered   Clinical Intake:  Pre-visit preparation completed: Yes  Pain : 0-10 Pain Score: 2  Pain Type: Chronic pain Pain Location: Knee (knee and back) Pain Descriptors / Indicators: Aching, Discomfort Pain Relieving Factors: Jel injections for knee; medication  Pain Relieving Factors: Jel injections for knee; medication  Nutritional Status: BMI > 30  Obese Nutritional Risks: None Diabetes: No  How often do you need to have someone help you when you read instructions, pamphlets, or other written materials from your doctor or pharmacy?: 1 - Never What is the last grade level you completed in school?: college  Diabetic?No  Interpreter Needed?: No  Information entered by :: Emelia Loron RN   Activities of Daily Living    01/04/2022    8:37 AM 01/31/2021   10:37 AM  In your present  state of health, do you have any difficulty performing the following activities:  Hearing? 0   Vision? 0   Difficulty concentrating or making decisions? 0   Walking or climbing stairs? 0   Dressing or bathing? 0   Doing errands, shopping? 0 0  Preparing Food and eating ? N   Using the Toilet? N   In the past six months, have you accidently leaked urine? N   Do you have problems with loss of bowel control? N   Managing your Medications? N   Managing your Finances? N   Housekeeping or managing your Housekeeping? N     Patient Care Team: Binnie Rail, MD as PCP - General (Internal Medicine)  Indicate any recent Medical Services you may have received from other than [facility] providers in the past year (date may be approximate).     Assessment:   This is a routine wellness examination for [name] [name].  Hearing/Vision screen No results found.  Dietary issues and exercise activities discussed: Current Exercise Habits: Home exercise routine, Type of exercise: walking;treadmill;strength training/weights (stationary bike), Time (Minutes): 60, Frequency (Times/Week): 3, Weekly Exercise (Minutes/Week): 180, Intensity: Mild, Exercise limited by: orthopedic condition(s)   Goals Addressed             This Visit's Progress    Patient Stated       Continue to work with my orthopedic to get my knee feeling better.       Depression Screen    01/04/2022    8:36 AM 07/21/2020    8:15 AM 10/20/2019    1:56 PM 06/26/2018   11:46 AM 10/26/2016   10:12 AM  PHQ 2/9 Scores  PHQ - 2 Score 0 0 0 1 0  PHQ- 9 Score    3     Fall Risk    01/04/2022    8:41  AM 09/01/2021    9:31 AM 07/15/2021   10:00 AM 07/21/2020    8:15 AM 05/05/2020    8:46 AM  Fall Risk   Falls in the past year? 0 0 0 0 0  Number falls in past yr: 0 0 0 0 0  Injury with Fall? 0 0 0 0 0  Risk for fall due to : No Fall Risks    No Fall Risks  Follow up Falls evaluation completed    Falls evaluation completed    FALL RISK  PREVENTION PERTAINING TO THE HOME:  Any stairs in or around the home? Yes  If so, are there any without handrails? No  Home free of loose throw rugs in walkways, pet beds, electrical cords, etc? Yes  Adequate lighting in your home to reduce risk of falls? Yes   ASSISTIVE DEVICES UTILIZED TO PREVENT FALLS:  Life alert? No  Use of a cane, walker or w/c? No  Grab bars in the bathroom? No  Shower chair or bench in shower? No  Elevated toilet seat or a handicapped toilet? No   Cognitive Function:      07/17/2021    7:00 PM  Montreal Cognitive Assessment   Visuospatial/ Executive (0/5) 5  Naming (0/3) 3  Attention: Read list of digits (0/2) 2  Attention: Read list of letters (0/1) 1  Attention: Serial 7 subtraction starting at 100 (0/3) 1  Language: Repeat phrase (0/2) 2  Language : Fluency (0/1) 1  Abstraction (0/2) 1  Delayed Recall (0/5) 3  Orientation (0/6) 5  Total 24  Adjusted Score (based on education) 24      01/04/2022    8:41 AM  6CIT Screen  What Year? 0 points  What month? 0 points  What time? 0 points  Count back from 20 0 points  Months in reverse 0 points  Repeat phrase 0 points  Total Score 0 points    Immunizations Immunization History  Administered Date(s) Administered   Fluad Quad(high Dose 65+) 10/01/2020, 10/19/2021   Influenza Whole 10/24/2011   Influenza,inj,Quad PF,6+ Mos 11/30/2016, 10/16/2017, 10/26/2018, 10/20/2019   Influenza-Unspecified 10/24/2014   Moderna Sars-Covid-2 Vaccination 04/21/2019, 05/19/2019, 10/24/2019, 10/23/2020   PNEUMOCOCCAL CONJUGATE-20 10/01/2020   Td 12/30/2008   Tdap 03/15/2015    TDAP status: Up to date  Flu Vaccine status: Up to date  Pneumococcal vaccine status: Up to date  Covid-19 vaccine status: Information provided on how to obtain vaccines.   Qualifies for Shingles Vaccine? Yes   Zostavax completed No   Shingrix Completed?: No.    Education has been provided regarding the importance of this  vaccine. Patient has been advised to call insurance company to determine out of pocket expense if they have not yet received this vaccine. Advised may also receive vaccine at local pharmacy or Health Dept. Verbalized acceptance and understanding.  Screening Tests Health Maintenance  Topic Date Due   FOOT EXAM  Never done   Zoster Vaccines- Shingrix (1 of 2) Never done   OPHTHALMOLOGY EXAM  06/20/2021   COVID-19 Vaccine (5 - 2023-24 season) 09/23/2021   HEMOGLOBIN A1C  01/03/2022   MAMMOGRAM  05/29/2022   Diabetic kidney evaluation - eGFR measurement  07/05/2022   Diabetic kidney evaluation - Urine ACR  07/05/2022   Medicare Annual Wellness (AWV)  01/05/2023   DTaP/Tdap/Td (3 - Td or Tdap) 03/14/2025   DEXA SCAN  09/21/2025   COLONOSCOPY (Pts 45-9yr Insurance coverage will need to be confirmed)  08/01/2028  Pneumonia Vaccine 83+ Years old  Completed   INFLUENZA VACCINE  Completed   Hepatitis C Screening  Completed   HPV VACCINES  Aged Out    Health Maintenance  Health Maintenance Due  Topic Date Due   FOOT EXAM  Never done   Zoster Vaccines- Shingrix (1 of 2) Never done   OPHTHALMOLOGY EXAM  06/20/2021   COVID-19 Vaccine (5 - 2023-24 season) 09/23/2021   HEMOGLOBIN A1C  01/03/2022    Colorectal cancer screening: Type of screening: Colonoscopy. Completed 08/01/21. Repeat every 7 years  Mammogram status: Completed 05/28/20. Repeat every year  Bone Density status: Completed 09/21/20. Results reflect: Bone density results: NORMAL. Repeat every 5 years.  Lung Cancer Screening: (Low Dose CT Chest recommended if Age 38-80 years, 30 pack-year currently smoking OR have quit w/in 15years.) does not qualify.   Additional Screening:  Hepatitis C Screening: does qualify; Completed 06/17/15  Vision Screening: Recommended annual ophthalmology exams for early detection of glaucoma and other disorders of the eye. Is the patient up to date with their annual eye exam?  No ,  provided  education Who is the provider or what is the name of the office in which the patient attends annual eye exams? [facility] If pt is not established with a provider, would they like to be referred to a provider to establish care?  N/A    Dental Screening: Recommended annual dental exams for proper oral hygiene  Community Resource Referral / Chronic Care Management: CRR required this visit?  No   CCM required this visit?  No      Plan:     I have personally reviewed and noted the following in the patient's chart:   Medical and social history Use of alcohol, tobacco or illicit drugs  Current medications and supplements including opioid prescriptions. Patient is not currently taking opioid prescriptions. Functional ability and status Nutritional status Physical activity Advanced directives List of other physicians Hospitalizations, surgeries, and ER visits in previous 12 months Vitals Screenings to include cognitive, depression, and falls Referrals and appointments  In addition, I have reviewed and discussed with patient certain preventive protocols, quality metrics, and best practice recommendations. A written personalized care plan for preventive services as well as general preventive health recommendations were provided to patient.     Michiel Cowboy, RN   01/04/2022   Nurse Notes:  [name] [name] , Thank you for taking time to come for your Medicare Wellness Visit. I appreciate your ongoing commitment to your health goals. Please review the following plan we discussed and let me know if I can assist you in the future.   These are the goals we discussed:  Goals      Patient Stated     Continue to work with my orthopedic to get my knee feeling better.         This is a list of the screening recommended for you and due dates:  Health Maintenance  Topic Date Due   Complete foot exam   Never done   Zoster (Shingles) Vaccine (1 of 2) Never done   Eye exam for diabetics   06/20/2021   COVID-19 Vaccine (5 - 2023-24 season) 09/23/2021   Hemoglobin A1C  01/03/2022   Mammogram  05/29/2022   Yearly kidney function blood test for diabetes  07/05/2022   Yearly kidney health urinalysis for diabetes  07/05/2022   Medicare Annual Wellness Visit  01/05/2023   DTaP/Tdap/Td vaccine (3 - Td or Tdap) 03/14/2025  DEXA scan (bone density measurement)  09/21/2025   Colon Cancer Screening  08/01/2028   Pneumonia Vaccine  Completed   Flu Shot  Completed   Hepatitis C Screening: USPSTF Recommendation to screen - Ages 18-79 yo.  Completed   HPV Vaccine  Aged Out

## 2022-01-04 NOTE — Assessment & Plan Note (Addendum)
Chronic Regular exercise and healthy diet encouraged Check lipid panel  Continue Crestor 20 mg daily, Vascepa 2 g twice daily

## 2022-01-04 NOTE — Assessment & Plan Note (Signed)
Chronic Controlled, Stable Continue sertraline 100 mg daily, buspirone 5 mg 3 times daily as needed

## 2022-01-04 NOTE — Assessment & Plan Note (Signed)
Chronic   Lab Results  Component Value Date   HGBA1C 6.5 07/04/2021   Sugars controlled Check A1c, urine microalbumin today Continue lifestyle control Stressed regular exercise, diabetic diet

## 2022-01-04 NOTE — Assessment & Plan Note (Addendum)
Chronic Taking iron supplementation-has decreased how much she is taking-originally started because of hair loss which has not really helped Will check iron levels, CBC May be able to stop since it is not helping her hair

## 2022-01-04 NOTE — Assessment & Plan Note (Addendum)
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

## 2022-01-04 NOTE — Assessment & Plan Note (Signed)
Chronic Blood pressure well controlled CMP Continue losartan 100 mg daily

## 2022-01-04 NOTE — Assessment & Plan Note (Signed)
Has seen neurology potentially been diagnosed with a mild cognitive disorder Imaging normal There was concern that may be ADD-maybe even anxiety is causing some of her memory issues Needs further evaluation Referral ordered for neuropsychology

## 2022-01-04 NOTE — Assessment & Plan Note (Signed)
Chronic  Clinically euthyroid Check tsh and will titrate med dose if needed Currently taking levothyroxine 112 mcg daily  

## 2022-01-04 NOTE — Assessment & Plan Note (Signed)
Medial knee pain from OA Seeing ortho Had injection recently

## 2022-01-06 ENCOUNTER — Encounter: Payer: Self-pay | Admitting: Psychology

## 2022-01-09 DIAGNOSIS — M1711 Unilateral primary osteoarthritis, right knee: Secondary | ICD-10-CM | POA: Diagnosis not present

## 2022-01-12 ENCOUNTER — Other Ambulatory Visit: Payer: Self-pay | Admitting: Internal Medicine

## 2022-01-18 DIAGNOSIS — M1711 Unilateral primary osteoarthritis, right knee: Secondary | ICD-10-CM | POA: Diagnosis not present

## 2022-01-20 ENCOUNTER — Encounter: Payer: Self-pay | Admitting: Family Medicine

## 2022-01-20 ENCOUNTER — Telehealth (INDEPENDENT_AMBULATORY_CARE_PROVIDER_SITE_OTHER): Payer: Medicare Other | Admitting: Family Medicine

## 2022-01-20 DIAGNOSIS — J3489 Other specified disorders of nose and nasal sinuses: Secondary | ICD-10-CM

## 2022-01-20 DIAGNOSIS — R519 Headache, unspecified: Secondary | ICD-10-CM

## 2022-01-20 DIAGNOSIS — J0140 Acute pansinusitis, unspecified: Secondary | ICD-10-CM | POA: Diagnosis not present

## 2022-01-20 DIAGNOSIS — R0981 Nasal congestion: Secondary | ICD-10-CM | POA: Diagnosis not present

## 2022-01-20 MED ORDER — SULFAMETHOXAZOLE-TRIMETHOPRIM 800-160 MG PO TABS: 1.0000 | ORAL_TABLET | Freq: Two times a day (BID) | ORAL | 0 refills | Status: DC

## 2022-01-20 MED ORDER — FLUTICASONE PROPIONATE 50 MCG/ACT NA SUSP: 2.0000 | Freq: Every day | NASAL | 1 refills | Status: DC

## 2022-01-20 NOTE — Progress Notes (Signed)
MyChart Video Visit    Virtual Visit via Video Note   This visit type was conducted due to national recommendations for restrictions regarding the COVID-19 Pandemic (e.g. social distancing) in an effort to limit this patient's exposure and mitigate transmission in our community. This patient is at least at moderate risk for complications without adequate follow up. This format is felt to be most appropriate for this patient at this time. Physical exam was limited by quality of the video and audio technology used for the visit. CMA was able to get the patient set up on a video visit.  Patient location: Home. Patient and provider in visit Provider location: Office  I discussed the limitations of evaluation and management by telemedicine and the availability of in person appointments. The patient expressed understanding and agreed to proceed.  Visit Date: 01/20/2022  Today's healthcare provider: Harland Dingwall, NP-C     Subjective:    Patient ID: [name] [name], female    DOB: [birth year], 66 y.o.   MRN: [id number]  Chief Complaint  Patient presents with   Nasal Congestion    Started a week ago   Cough   Headache    Accompanied by facial pressure    HPI C/o 8-9 day hx of rhinorrhea, sneezing, nasal congestion, facial pain, post nasal drainage,  headache, cough productive of yellow-brown sputum.   No fever, chills, body aches, chest pain, palpitations, shortness of breath, abdominal pain, nausea, vomiting or diarrhea.  Taking Coricidin.    Past Medical History:  Diagnosis Date   Arthritis    Cholelithiasis    Dysrhythmia    Heart murmur    Hepatic steatosis    History of prediabetes    Hyperlipemia    Hypertension    Hypothyroidism    PONV (postoperative nausea and vomiting)    Pre-diabetes     Past Surgical History:  Procedure Laterality Date   ABDOMINAL HYSTERECTOMY     @ age 57, due to fibroids    COLONOSCOPY W/ POLYPECTOMY  01/24/2008   Dr.Jacobs,  due 2015    DILATION AND CURETTAGE OF UTERUS     JOINT REPLACEMENT     left knee   MOHS SURGERY  01/23/2002   Basal Cell    TONSILLECTOMY     TOTAL KNEE ARTHROPLASTY  09/22/2011   Dr Nicholes Stairs, Left;   TRANSFORAMINAL LUMBAR INTERBODY FUSION W/ MIS 1 LEVEL N/A 02/02/2021   Procedure: Lumbar three-four Minimally Invasive Transforaminal Lumbar Interbody Fusion;  Surgeon: [name] Part, MD;  Location: [facility];  Service: Neurosurgery;  Laterality: N/A;    Family History  Problem Relation Age of Onset   Diabetes Mother    Leukemia Mother    Alcohol abuse Father    Lung cancer Father        smoker   Diabetes Brother    Heart attack Maternal Uncle        X3 ; MIs in 66s & 48s   Stroke Maternal Uncle        in his 77s   Adrenal disorder Neg Hx    Colon cancer Neg Hx    Colon polyps Neg Hx    Esophageal cancer Neg Hx    Rectal cancer Neg Hx    Stomach cancer Neg Hx     Social History   Socioeconomic History   Marital status: Married    Spouse name: Juanda Crumble   Number of children: 2   Years of education: 14   Highest education  level: Not on file  Occupational History   Not on file  Tobacco Use   Smoking status: Never   Smokeless tobacco: Never  Vaping Use   Vaping Use: Never used  Substance and Sexual Activity   Alcohol use: Yes    Comment: rare   Drug use: No   Sexual activity: Yes  Other Topics Concern   Not on file  Social History Narrative   Not exercising regularly   Right handed   Caffeine yes   Two story home    Social Determinants of Health   Financial Resource Strain: Low Risk  (01/04/2022)   Overall Financial Resource Strain (CARDIA)    Difficulty of Paying Living Expenses: Not hard at all  Food Insecurity: No Food Insecurity (01/04/2022)   Hunger Vital Sign    Worried About Running Out of Food in the Last Year: Never true    Ran Out of Food in the Last Year: Never true  Transportation Needs: No Transportation Needs (01/04/2022)   PRAPARE -  Hydrologist (Medical): No    Lack of Transportation (Non-Medical): No  Physical Activity: Sufficiently Active (01/04/2022)   Exercise Vital Sign    Days of Exercise per Week: 3 days    Minutes of Exercise per Session: 60 min  Stress: No Stress Concern Present (01/04/2022)   [place]    Feeling of Stress : Not at all  Social Connections: Moderately Isolated (01/04/2022)   Social Connection and Isolation Panel [NHANES]    Frequency of Communication with Friends and Family: More than three times a week    Frequency of Social Gatherings with Friends and Family: More than three times a week    Attends Religious Services: Never    Marine scientist or Organizations: No    Attends Archivist Meetings: Never    Marital Status: Married  Human resources officer Violence: Not At Risk (01/04/2022)   Humiliation, Afraid, Rape, and Kick questionnaire    Fear of Current or Ex-Partner: No    Emotionally Abused: No    Physically Abused: No    Sexually Abused: No    Outpatient Medications Prior to Visit  Medication Sig Dispense Refill   azelastine (ASTELIN) 0.1 % nasal spray Place 2 sprays into both nostrils 2 (two) times daily. 30 mL 12   benzonatate (TESSALON) 100 MG capsule Take 1 capsule (100 mg total) by mouth 3 (three) times daily as needed. 30 capsule 0   busPIRone (BUSPAR) 5 MG tablet TAKE ONE TABLET BY MOUTH THREE TIMES A DAY 90 tablet 2   cholecalciferol (VITAMIN D3) 25 MCG (1000 UNIT) tablet Take 1,000 Units by mouth daily.     diclofenac (VOLTAREN) 75 MG EC tablet Take 1 tablet (75 mg total) by mouth 2 (two) times daily as needed. 60 tablet 3   Ferrous Sulfate 27 MG TABS Take one pill every a few times a week  0   icosapent Ethyl (VASCEPA) 1 g capsule Take 2 capsules (2 g total) by mouth 2 (two) times daily. 120 capsule 5   levothyroxine (SYNTHROID) 112 MCG tablet Take 1 tablet (112  mcg total) by mouth daily. 90 tablet 3   losartan (COZAAR) 100 MG tablet TAKE 1 TABLET BY MOUTH DAILY 90 tablet 1   Multiple Vitamins-Minerals (HAIR/SKIN/NAILS/BIOTIN PO) Take 1 tablet by mouth daily.     Polyvinyl Alcohol-Povidone PF (REFRESH) 1.4-0.6 % SOLN Place 1 drop into both  eyes 3 (three) times daily as needed (dry eyes).     rosuvastatin (CRESTOR) 20 MG tablet TAKE ONE TABLET BY MOUTH DAILY - MUST CALL MD FOR APPOINTMENT 90 tablet 2   sertraline (ZOLOFT) 100 MG tablet Take 1 tablet (100 mg total) by mouth daily. Pls keep 01/04/22 appt for future refills 30 tablet 0   vitamin B-12 (CYANOCOBALAMIN) 1000 MCG tablet Take 1,000 mcg by mouth 3 (three) times a week.     No facility-administered medications prior to visit.    Allergies  Allergen Reactions   Ampicillin Rash    rash   Doxycycline Other (See Comments)    Joint symptoms w/o rash or fever   Effexor [Venlafaxine] Other (See Comments)    Constipation, elevated BP   Oxycodone Nausea Only   Penicillins Rash    ROS     Objective:    Physical Exam  There were no vitals taken for this visit. Wt Readings from Last 3 Encounters:  01/04/22 228 lb (103.4 kg)  09/01/21 231 lb (104.8 kg)  08/01/21 230 lb (104.3 kg)   Alert and oriented and in no acute distress.  Respirations unlabored.  Sniffling and tenderness to sinuses noted.    Assessment & Plan:   Problem List Items Addressed This Visit   None Visit Diagnoses     Acute pansinusitis, recurrence not specified    -  Primary   Relevant Medications   sulfamethoxazole-trimethoprim (BACTRIM DS) 800-160 MG tablet   fluticasone (FLONASE) 50 MCG/ACT nasal spray   Nasal congestion with rhinorrhea       Relevant Medications   fluticasone (FLONASE) 50 MCG/ACT nasal spray   Facial pain          Bactrim prescribed.  She has several allergies to antibiotics.  Recently completed a Z-Pak for bronchitis in November. Flonase prescribed.  Discussed symptomatic treatment.   Encourage good hydration.  Follow-up if worsening or not improving in the next week.  I am having [name] Hollern. [name] start on sulfamethoxazole-trimethoprim and fluticasone. I am also having her maintain her cholecalciferol, cyanocobalamin, Multiple Vitamins-Minerals (HAIR/SKIN/NAILS/BIOTIN PO), Refresh, azelastine, levothyroxine, icosapent Ethyl, rosuvastatin, busPIRone, benzonatate, sertraline, diclofenac, Ferrous Sulfate, and losartan.  Meds ordered this encounter  Medications   sulfamethoxazole-trimethoprim (BACTRIM DS) 800-160 MG tablet    Sig: Take 1 tablet by mouth 2 (two) times daily.    Dispense:  14 tablet    Refill:  0    Order Specific Question:   Supervising Provider    Answer:   Pricilla Holm A [[id number]]   fluticasone (FLONASE) 50 MCG/ACT nasal spray    Sig: Place 2 sprays into both nostrils daily.    Dispense:  16 g    Refill:  1    Order Specific Question:   Supervising Provider    Answer:   Pricilla Holm A [[id number]]    I discussed the assessment and treatment plan with the patient. The patient was provided an opportunity to ask questions and all were answered. The patient agreed with the plan and demonstrated an understanding of the instructions.   The patient was advised to call back or seek an in-person evaluation if the symptoms worsen or if the condition fails to improve as anticipated.  I provided 16 minutes of face-to-face time during this encounter.   Harland Dingwall, NP-C [facility] [place] [phone] (phone) [phone] (fax)  [place]

## 2022-01-27 ENCOUNTER — Other Ambulatory Visit: Payer: Self-pay

## 2022-01-27 ENCOUNTER — Telehealth: Payer: Self-pay | Admitting: Internal Medicine

## 2022-01-27 MED ORDER — SERTRALINE HCL 100 MG PO TABS: 100.0000 mg | ORAL_TABLET | Freq: Every day | ORAL | 0 refills | Status: DC

## 2022-01-27 NOTE — Telephone Encounter (Signed)
Caller & Relationship to patient:  Self   Call back [phone]   Date of last office visit:  01/04/2022   Date of next office visit:   Medication(s) to be refilled:  Sertraline -   Patient is completely out of this medication         Preferred [facility]: [facility]  [address]

## 2022-01-27 NOTE — Telephone Encounter (Signed)
Refill has been sent to [facility].

## 2022-02-13 ENCOUNTER — Encounter: Payer: Self-pay | Admitting: Psychology

## 2022-02-13 DIAGNOSIS — N751 Abscess of Bartholin's gland: Secondary | ICD-10-CM | POA: Diagnosis not present

## 2022-02-14 ENCOUNTER — Ambulatory Visit (INDEPENDENT_AMBULATORY_CARE_PROVIDER_SITE_OTHER): Payer: Medicare Other | Admitting: Psychology

## 2022-02-14 ENCOUNTER — Encounter: Payer: Self-pay | Admitting: Psychology

## 2022-02-14 ENCOUNTER — Ambulatory Visit: Payer: Medicare Other

## 2022-02-14 DIAGNOSIS — R413 Other amnesia: Secondary | ICD-10-CM

## 2022-02-14 DIAGNOSIS — F411 Generalized anxiety disorder: Secondary | ICD-10-CM | POA: Diagnosis not present

## 2022-02-14 DIAGNOSIS — R4189 Other symptoms and signs involving cognitive functions and awareness: Secondary | ICD-10-CM

## 2022-02-14 NOTE — Progress Notes (Unsigned)
   Psychometrician Note   Cognitive testing was administered to [name] [name] by Cruzita Lederer, B.S. (psychometrist) under the supervision of Dr. Christia Reading, Ph.D., licensed psychologist on 02/14/2022. [name] [name] did not appear overtly distressed by the testing session per behavioral observation or responses across self-report questionnaires. Rest breaks were offered.    The battery of tests administered was selected by Dr. Christia Reading, Ph.D. with consideration to [name] [name]'s current level of functioning, the nature of her symptoms, emotional and behavioral responses during interview, level of literacy, observed level of motivation/effort, and the nature of the referral question. This battery was communicated to the psychometrist. Communication between Dr. Christia Reading, Ph.D. and the psychometrist was ongoing throughout the evaluation and Dr. Christia Reading, Ph.D. was immediately accessible at all times. Dr. Christia Reading, Ph.D. provided supervision to the psychometrist on the date of this service to the extent necessary to assure the quality of all services provided.    [name] [name] will return within approximately 1-2 weeks for an interactive feedback session with Dr. Melvyn Novas at which time her test performances, clinical impressions, and treatment recommendations will be reviewed in detail. [name] [name] understands she can contact our office should she require our assistance before this time.  A total of 180 minutes of billable time were spent face-to-face with [name] [name] by the psychometrist. This includes both test administration and scoring time. Billing for these services is reflected in the clinical report generated by Dr. Christia Reading, Ph.D.  This note reflects time spent with the psychometrician and does not include test scores or any clinical interpretations made by Dr. Melvyn Novas. The full report will follow in a separate note.

## 2022-02-14 NOTE — Progress Notes (Signed)
NEUROPSYCHOLOGICAL EVALUATION [place] Department of Neurology  Date of Evaluation: February 14, 2022  Reason for Referral:   [name] [name] is a 67 y.o. right-handed Caucasian female referred by Sharene Butters, PA-C, to characterize her current cognitive functioning and assist with diagnostic clarity and treatment planning in the context of subjective cognitive decline.   Assessment and Plan:   Clinical Impression(s): [name] [name]'s pattern of performance is suggestive of neuropsychological functioning largely within normal limits relative to age-matched peers. She exhibited an isolated impairment learning and later retrieving information from a lengthy list of words. However, she was able to learn and recall information well across three other memory tasks. Thus, this is not suggestive of a consistent impairment or weakness. Performance was also appropriate relative to age-matched peers across processing speed, attention/concentration, executive functioning, receptive and expressive language, visuospatial abilities, and recognition/consolidation aspects of memory. [name] [name] denied difficulties completing instrumental activities of daily living (ADLs) independently. I do not believe that current performances arise to the level where a neurocognitive disorder diagnosis is warranted.   Regarding the etiology for subjective concerns expressed during interview, there are several variables which, when combined, may explain her experiences. While she denied anxiety during interview, she was noted to appear extremely anxious during interview and more so throughout testing procedures. Medical records also suggest other clinicians expressing concern surrounding ongoing anxiety. When present, anxiety can certainly impact certain aspects of memory, particularly encoding (i.e., learning) efficiency. Outside of this, she reported ongoing psychosocial stressors and a  history of currently untreated obstructive sleep apnea. Both of which could also create and/or worsen subjective day-to-day dysfunction. Specific to ADHD, [name] [name] performed well across all tasks assessing attention/concentration, as well as all aspects of a continuous performance task. While she may have various traits of this condition, there is not strong evidence to warrant a formal diagnosis in my opinion. It should be highlighted that anxiety and ADHD have much symptom overlap the the latter condition is often misdiagnosed when there are prominent concerns surrounding the former.   Despite some trouble across a list learning task, memory performances as a whole are not suggestive of Alzheimer's disease at the present time. Likewise, cognitive and behavioral characteristics are not suggestive of any other form of neurodegenerative illness presently. Continued medical monitoring will be important moving forward.   Recommendations: Should [name] [name] express concern surrounding cognitive decline in the future, a repeat neuropsychological evaluation would be warranted at that time. The current evaluation will serve as an excellent baseline moving forward.  A combination of medication and psychotherapy has been shown to be most effective at treating symptoms of anxiety and depression. As such, [name] [name] is encouraged to speak with her prescribing physician regarding medication adjustments to optimally manage these symptoms. Likewise, [name] [name] is encouraged to consider engaging in short-term psychotherapy to address symptoms of psychiatric distress. She would benefit from an active and collaborative therapeutic environment, rather than one purely supportive in nature. Recommended treatment modalities include Cognitive Behavioral Therapy (CBT) or Acceptance and Commitment Therapy (ACT).  [name] [name] is encouraged to attend to lifestyle factors for brain health (e.g., regular physical  exercise, good nutrition habits, regular participation in cognitively-stimulating activities, and general stress management techniques), which are likely to have benefits for both emotional adjustment and cognition. In fact, in addition to promoting good general health, regular exercise incorporating aerobic activities (e.g., brisk walking, jogging, cycling, etc.) has been demonstrated to be a very effective treatment for  depression and stress, with similar efficacy rates to both antidepressant medication and psychotherapy. Optimal control of vascular risk factors (including safe cardiovascular exercise and adherence to dietary recommendations) is encouraged. Likewise, continued compliance with her CPAP machine will also be important. Continued participation in activities which provide mental stimulation and social interaction is also recommended.   If interested, there are some activities which have therapeutic value and can be useful in keeping her cognitively stimulated. For suggestions, [name] [name] is encouraged to go to the following website: https://www.[facility].org/get-to-know-barrow/centers-programs/neurorehabilitation-center/neuro-rehab-apps-and-games/ which has options, categorized by level of difficulty. It should be noted that these activities should not be viewed as a substitute for therapy.  When learning new information, she would benefit from information being broken up into small, manageable pieces. She may also find it helpful to articulate the material in her own words and in a context to promote encoding at the onset of a new task. This material may need to be repeated multiple times to promote encoding.  Memory can be improved using internal strategies such as rehearsal, repetition, chunking, mnemonics, association, and imagery. External strategies such as written notes in a consistently used memory journal, visual and nonverbal auditory cues such as a calendar on the refrigerator or  appointments with alarm, such as on a cell phone, can also help maximize recall.    To address problems with fluctuating attention, she may wish to consider:   -Avoiding external distractions when needing to concentrate   -Limiting exposure to fast paced environments with multiple sensory demands   -Writing down complicated information and using checklists   -Attempting and completing one task at a time (i.e., no multi-tasking)   -Verbalizing aloud each step of a task to maintain focus    -Reducing the amount of information considered at one time  Review of Records:   [name] [name] was seen by [facility] Sharene Butters, PA-C) on 07/15/2021 for an evaluation of memory loss. At that time, short-term memory difficulties surrounding trouble with familiar name retrieval was said to have been present for many years. However, [name] [name] expressed concerns that it had been worsening over the past two years. Information will normally come to her with time and this seems worse during periods of heightened anxiety. She also described some greater trouble with multi-tasking. She described a long history of inattention and distractibility and, while never formally diagnosed with ADHD, always wondered if she had this condition. ADLs were described as intact. Performance on a brief cognitive screening instrument (MOCA) was 24/30. Ultimately, [name] [name] was referred for a comprehensive neuropsychological evaluation to characterize her cognitive abilities and to assist with diagnostic clarity and treatment planning.   Brain MRI on 07/27/2021 was unremarkable.   Past Medical History:  Diagnosis Date   Allergic rhinoconjunctivitis, seasonal and perennial 07/02/2012   Arthritis    Bartholin's gland abscess 08/08/2021   left side, 5cm, abundant pus expressed, word catheter placed, f/u 1 week left side, 5cm, abundant pus expressed, word catheter placed, f/u 1 week- word catheter removed at pt request    Calculus of gallbladder 09/11/2008   Cholelithiasis    Dysmetabolic syndrome X 07/23/1599   Dysrhythmia    Essential hypertension 09/11/2007   Fatty liver disease, nonalcoholic 09/32/3557   Generalized anxiety disorder 03/28/2015   Tremors on 100 mg sertraline - improved with 50 mg dose weaning off benzo   Heart murmur    Hepatic steatosis    Herniated cervical disc 06/17/2015   Numbness in left arm  Improved  with PT   History of basal cell carcinoma 05/09/2006   History of colonic polyps 02/09/2010   Adenomatous   Hyperhidrosis 07/21/2020   Intermittent-had only sweats-?  Related to anxiety   Hyperlipemia    Hyperlipidemia 09/11/2007   Hypothyroidism    Iron deficiency 02/06/2017   Localized osteoarthritis of right knee 01/04/2022   Lumbar radiculopathy 12/26/2018   Menopausal flushing 06/17/2020   worsening, reviewed risks of HRT and other non-hormonal options, pt would like to try Effexor and will contact PCP about option of clonidine. Also discussed black cohosh, soy supplements   Mild cognitive impairment of uncertain or unknown etiology 07/17/2021   OSA (obstructive sleep apnea) 09/06/2012   HST 2014:  AHI 8/hr  No CPAP; Dr Gwenette Greet   Palpitations 06/17/2015   History of PVC  Full cardiac work up neg 2016  Propranolol prn   PONV (postoperative nausea and vomiting)    Type II diabetes mellitus 06/17/2015   Vitamin D deficiency 02/06/2017    Past Surgical History:  Procedure Laterality Date   ABDOMINAL HYSTERECTOMY     @ age 60, due to fibroids    COLONOSCOPY W/ POLYPECTOMY  01/24/2008   Dr.Jacobs, due 2015    [place]     left knee   MOHS SURGERY  01/23/2002   Basal Cell    TONSILLECTOMY     TOTAL KNEE ARTHROPLASTY  09/22/2011   Dr Nicholes Stairs, Left;   TRANSFORAMINAL LUMBAR INTERBODY FUSION W/ MIS 1 LEVEL N/A 02/02/2021   Procedure: Lumbar three-four Minimally Invasive Transforaminal Lumbar Interbody Fusion;  Surgeon:  [name], MD;  Location: [place];  Service: Neurosurgery;  Laterality: N/A;    Current Outpatient Medications:    azelastine (ASTELIN) 0.1 % nasal spray, Place 2 sprays into both nostrils 2 (two) times daily., Disp: 30 mL, Rfl: 12   benzonatate (TESSALON) 100 MG capsule, Take 1 capsule (100 mg total) by mouth 3 (three) times daily as needed., Disp: 30 capsule, Rfl: 0   busPIRone (BUSPAR) 5 MG tablet, TAKE ONE TABLET BY MOUTH THREE TIMES A DAY, Disp: 90 tablet, Rfl: 2   cholecalciferol (VITAMIN D3) 25 MCG (1000 UNIT) tablet, Take 1,000 Units by mouth daily., Disp: , Rfl:    diclofenac (VOLTAREN) 75 MG EC tablet, Take 1 tablet (75 mg total) by mouth 2 (two) times daily as needed., Disp: 60 tablet, Rfl: 3   Ferrous Sulfate 27 MG TABS, Take one pill every a few times a week, Disp: , Rfl: 0   fluticasone (FLONASE) 50 MCG/ACT nasal spray, Place 2 sprays into both nostrils daily., Disp: 16 g, Rfl: 1   icosapent Ethyl (VASCEPA) 1 g capsule, Take 2 capsules (2 g total) by mouth 2 (two) times daily., Disp: 120 capsule, Rfl: 5   levothyroxine (SYNTHROID) 112 MCG tablet, Take 1 tablet (112 mcg total) by mouth daily., Disp: 90 tablet, Rfl: 3   losartan (COZAAR) 100 MG tablet, TAKE 1 TABLET BY MOUTH DAILY, Disp: 90 tablet, Rfl: 1   Multiple Vitamins-Minerals (HAIR/SKIN/NAILS/BIOTIN PO), Take 1 tablet by mouth daily., Disp: , Rfl:    Polyvinyl Alcohol-Povidone PF (REFRESH) 1.4-0.6 % SOLN, Place 1 drop into both eyes 3 (three) times daily as needed (dry eyes)., Disp: , Rfl:    rosuvastatin (CRESTOR) 20 MG tablet, TAKE ONE TABLET BY MOUTH DAILY - MUST CALL MD FOR APPOINTMENT, Disp: 90 tablet, Rfl: 2   sertraline (ZOLOFT) 100 MG tablet, Take 1 tablet (100  mg total) by mouth daily. Pls keep 01/04/22 appt for future refills, Disp: 30 tablet, Rfl: 0   sulfamethoxazole-trimethoprim (BACTRIM DS) 800-160 MG tablet, Take 1 tablet by mouth 2 (two) times daily., Disp: 14 tablet, Rfl: 0   vitamin B-12  (CYANOCOBALAMIN) 1000 MCG tablet, Take 1,000 mcg by mouth 3 (three) times a week., Disp: , Rfl:   Clinical Interview:   The following information was obtained during a clinical interview with [name] [name]. [place] prior to cognitive testing.  Cognitive Symptoms: Decreased short-term memory: Endorsed. Primary difficulties surrounded recalling names of familiar individuals on demand. Difficulties were said to be present for many years but lately have seemed to have progressively worsened.  Decreased long-term memory: Denied. Decreased attention/concentration: Endorsed. She reported a longstanding history of trouble with inattention and distractibility dating back to early childhood and adolescence. While she was never formally tested or diagnosed with ADHD, she commented that both her children have been diagnosed and she has always felt that she had traits of this condition at the very least. These were said to have remained stable over time.  Reduced processing speed: Denied. Difficulties with executive functions: Endorsed. She reported longstanding weaknesses surrounding organization and, to a lesser extent, multi-tasking, generally attributed to concerns surrounding ADHD. These were said to have remained stable over time. She denied trouble with impulsivity or any significant personality changes.  Difficulties with emotion regulation: Denied. Difficulties with receptive language: Denied. Difficulties with word finding: Endorsed. Decreased visuoperceptual ability: Denied.  Difficulties completing ADLs: Denied.  Additional Medical History: History of traumatic brain injury/concussion: Denied. History of stroke: Denied. History of seizure activity: Denied. History of known exposure to toxins: Denied. Symptoms of chronic pain: Denied outside of ongoing back pain while is generally manageable.  Experience of frequent headaches/migraines: Denied. Frequent instances of dizziness/vertigo:  Denied.  Sensory changes: She wears glasses with benefit. Other sensory changes/difficulties (e.g., hearing, taste, smell) were denied.  Balance/coordination difficulties: Denied. She also denied any recent falls. Other motor difficulties: Denied.  Sleep History: Estimated hours obtained each night: 4-5 hours. She noted that this has been gradually improving and was recently disrupted following a back procedure.  Difficulties falling asleep: Denied. Difficulties staying asleep: Endorsed. She described waking throughout the night, largely due to unknown reasons or due to her not feeling tired.  Feels rested and refreshed upon awakening: Endorsed.  History of snoring: Endorsed. History of waking up gasping for air: Endorsed. Witnessed breath cessation while asleep: Endorsed. Medical records suggest a history of obstructive sleep apnea. [name] [name] commented that she was in the process of scheduling a sleep study to better understand this condition and implement PAP treatment.   History of vivid dreaming: Denied. Excessive movement while asleep: Denied. Instances of acting out her dreams: Denied.  Psychiatric/Behavioral Health History: Depression: She described her current mood as "fine" and positive overall. She denied to her knowledge any prior mental health concerns or diagnoses surrounding a depressive condition. Current or remote suicidal ideation, intent, or plan was denied.  Anxiety: She denied a history of anxious distress. However, medical records suggest a prior diagnosis of a generalized anxiety disorder and notes from various medical providers have described her as seeming very anxious. She has been prescribed sertraline, which [name] [name] described as being helpful in reducing her day-to-day "snappiness" towards others.  Mania: Denied. Trauma History: Denied. Visual/auditory hallucinations: Denied. Delusional thoughts: Denied.  Tobacco: Denied. Alcohol: She denied current  alcohol use as well as a history of problematic alcohol abuse or dependence.  Recreational drugs: Denied.  Family History: Problem Relation Age of Onset   Diabetes Mother    Leukemia Mother    Alcohol abuse Father    Lung cancer Father        smoker   Diabetes Brother    Heart attack Maternal Uncle        X3 ; MIs in 73s & 52s   Stroke Maternal Uncle        in his 82s   Adrenal disorder Neg Hx    Colon cancer Neg Hx    Colon polyps Neg Hx    Esophageal cancer Neg Hx    Rectal cancer Neg Hx    Stomach cancer Neg Hx    This information was confirmed by [name] [name].  Academic/Vocational History: Highest level of educational attainment: 14 years. 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 No relative weaknesses were reported. She went back to school as an adult, ultimately earning an Associate's degree around age 89.  History of developmental delay: Denied. History of grade repetition: Denied. Enrollment in special education courses: Denied. History of LD/ADHD: Denied.  Employment: Retired. 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   Evaluation Results:   Behavioral Observations: [name] [name] was unaccompanied, arrived to her appointment on time, and was appropriately dressed and groomed. She appeared alert and oriented. Observed gait and station were within normal limits. Gross motor functioning appeared intact upon informal observation and no abnormal movements (e.g., tremors) were noted. Her affect was fairly anxious during interview and she commonly fidgeted with her hands as they were folded in her lap. She later commented that she was nervous surrounding upcoming test procedures. Spontaneous speech was fluent and word finding difficulties were not observed during the clinical interview. Thought processes were coherent, organized, and normal in content. Insight into her cognitive difficulties  appeared adequate.   During testing, she was noted to appear extremely anxious and at times was seen profusely sweating. She regularly commented "I'm going to cry" when she became frustrated, as well as made statements surrounding her belief that she would perform very poorly prior to attempting various tasks. Sustained attention was appropriate. Task engagement was adequate and she persisted when challenged. Overall, [name] [name] was cooperative with the clinical interview and subsequent testing procedures.   Adequacy of Effort: The validity of neuropsychological testing is limited by the extent to which the individual being tested may be assumed to have exerted adequate effort during testing. [name] [name] expressed her intention to perform to the best of her abilities and exhibited adequate task engagement and persistence. Scores across stand-alone and embedded performance validity measures were within expectation. As such, the results of the current evaluation are believed to be a valid representation of [name] [name]. Beam's current cognitive functioning.  Test Results: [name] [name]. [place] was very mildly disoriented at the time of the current evaluation. She was three days off when stating the current date and one day off when stating the current day of the week.  Intellectual abilities based upon educational and vocational attainment were estimated to be in the average range. Premorbid abilities were estimated to be within the below average range based upon a single-word reading test.   Processing speed was average to well above average. Basic attention was average. More complex attention (e.g., working memory) was above average. Performance across a continuous performance task was normal and not suggestive of an underlying attentional disorder such as  ADHD. Executive functioning was average to well above average.  While not directly assessed, receptive language abilities were believed to be intact.  Likewise, [name] [name]. [place] did not exhibit any difficulties comprehending task instructions and answered all questions asked of her appropriately. Assessed expressive language (e.g., verbal fluency and confrontation naming) was mildly variable but overall appropriate, ranging from the below average to above average normative ranges.     Assessed visuospatial/visuoconstructional abilities were average to above average.    Learning (i.e., encoding) of novel information was exceptionally low across a list learning task but average to well above average across other verbal and visual tasks. Spontaneous delayed recall (i.e., retrieval) of previously learned information was commensurate with performances across learning trials. Retention rates were 91% across a story learning task, 100% across a list learning task, 80% across a daily living task, and 67% across a shape learning task. Performance across recognition tasks was average to above average, suggesting evidence for information consolidation.   Results of emotional screening instruments suggested that recent symptoms of generalized anxiety were in the minimal range, while symptoms of depression were within normal limits. She did not elevate any clinical subscales across a more comprehensive personality questionnaire. However, she did elevate validity indicators, suggesting that she answered in a fairly inconsistent manner and presented herself in an overly favorable light, likely minimizing ongoing difficulties. A screening instrument assessing recent sleep quality suggested the presence of minimal sleep dysfunction.  Tables of Scores:   Note: This summary of test scores accompanies the interpretive report and should not be considered in isolation without reference to the appropriate sections in the text. Descriptors are based on appropriate normative data and may be adjusted based on clinical judgment. Terms such as "Within Normal Limits" and "Outside  Normal Limits" are used when a more specific description of the test score cannot be determined.       Percentile - Normative Descriptor > 98 - Exceptionally High 91-97 - Well Above Average 75-90 - Above Average 25-74 - Average 9-24 - Below Average 2-8 - Well Below Average < 2 - Exceptionally Low       Validity:   DESCRIPTOR       ACS Word Choice: --- --- Within Normal Limits  Dot Counting Test: --- --- Within Normal Limits  NAB EVI: --- --- Within Normal Limits       Orientation:      Raw Score Percentile   NAB Orientation, Form 1 26/29 --- ---       Cognitive Screening:      Raw Score Percentile   SLUMS: 23/30 --- ---       Intellectual Functioning:      Standard Score Percentile   Test of Premorbid Functioning: 86 18 Below Average       Memory:     NAB Memory Module, Form 1: Standard Score/ T Score Percentile   Total Memory Index 83 13 Below Average  List Learning       Total Trials 1-3 5/36 (19) <1 Exceptionally Low    Short Delay Free Recall 4/12 (34) 5 Well Below Average    Long Delay Free Recall 4/12 (35) 7 Well Below Average    Retention Percentage 100 (51) 54 Average    Recognition Discriminability 5 (43) 25 Average  Shape Learning       Total Trials 1-3 22/27 (68) 96 Well Above Average    Delayed Recall 6/9 (53) 62 Average    Retention Percentage 67 (40) 16 Below  Average    Recognition Discriminability 9 (62) 88 Above Average  Story Learning       Immediate Recall 56/80 (43) 25 Average    Delayed Recall 29/40 (42) 21 Below Average    Retention Percentage 91 (51) 54 Average  Daily Living Memory       Immediate Recall 39/51 (43) 25 Average    Delayed Recall 12/17 (41) 18 Below Average    Retention Percentage 80 (45) 31 Average    Recognition Hits 9/10 (52) 58 Average       Attention/Executive Function:     Trail Making Test (TMT): Raw Score (T Score) Percentile     Part A 19 secs.,  1 error (69) 97 Well Above Average    Part B 60 secs.,  0 errors  (58) 79 Above Average         Scaled Score Percentile   WAIS-IV Coding: 12 75 Above Average       NAB Attention Module, Form 1: T Score Percentile     Digits Forward 53 62 Average    Digits Backwards 62 88 Above Average       Conners CPT 3: T Score Percentile     d' 36 8 Low    Omissions 44 27 Low    Commissions 40 16 Low    Perseverations 47 38 Average    HRT 53 62 Average    HRT SD 41 18 Low    Variability 44 27 Low       D-KEFS Color-Word Interference Test: Raw Score (Scaled Score) Percentile     Color Naming 28 secs. (12) 75 Above Average    Word Reading 24 secs. (10) 50 Average    Inhibition 54 secs. (13) 84 Above Average      Total Errors 0 errors (12) 75 Above Average    Inhibition/Switching 81 secs. (9) 37 Average      Total Errors 5 errors (8) 25 Average       D-KEFS Verbal Fluency Test: Raw Score (Scaled Score) Percentile     Letter Total Correct 46 (13) 84 Above Average    Category Total Correct 27 (7) 16 Below Average    Category Switching Total Correct 16 (14) 91 Well Above Average    Category Switching Accuracy 15 (14) 91 Well Above Average      Total Set Loss Errors 0 (13) 84 Above Average      Total Repetition Errors 2 (11) 63 Average       Language:     Verbal Fluency Test: Raw Score (T Score) Percentile     Phonemic Fluency (FAS) 46 (55) 69 Average    Animal Fluency 16 (42) 21 Below Average        NAB Language Module, Form 1: T Score Percentile     Naming 28/31 (38) 12 Below Average       Visuospatial/Visuoconstruction:      Raw Score Percentile   Clock Drawing: 10/10 --- Within Normal Limits       NAB Spatial Module, Form 1: T Score Percentile     Figure Drawing Copy 58 79 Above Average        Scaled Score Percentile   WAIS-IV Block Design: 11 63 Average       Mood and Personality:      Raw Score Percentile   Beck Depression Inventory - II: 1 --- Within Normal Limits  PROMIS Anxiety Questionnaire: 9 --- None to Slight  Personality  Assessment Inventory: T Score  Percentile     Inconsistency 70 --- Moderate    Infrequency 40 --- Within Normal Limits    Negative Impression 44 --- Within Normal Limits    Positive Impression 64 --- Moderate    Somatic Complaints 48 --- Within Normal Limits    Anxiety 55 --- Within Normal Limits    Anxiety-Related Disorders 37 --- Within Normal Limits    Depression 47 --- Within Normal Limits    Mania 39 --- Within Normal Limits    Paranoia 33 --- Within Normal Limits    Schizophrenia 38 --- Within Normal Limits    Borderline Features 40 --- Within Normal Limits    Antisocial Features 39 --- Within Normal Limits    Alcohol Problems 41 --- Within Normal Limits    Drug Problems 60 --- Within Normal Limits    Aggression 55 --- Within Normal Limits    Suicidal Ideation 43 --- Within Normal Limits    Stress 55 --- Within Normal Limits    Non Support 37 --- Within Normal Limits    Treatment Rejection 61 --- Within Normal Limits    Dominance 54 --- Within Normal Limits    Warmth 58 --- Within Normal Limits       Additional Questionnaires:      Raw Score Percentile   PROMIS Sleep Disturbance Questionnaire: 20 --- None to Slight   Informed Consent and Coding/Compliance:   The current evaluation represents a clinical evaluation for the purposes previously outlined by the referral source and is in no way reflective of a forensic evaluation.   [name] [name]. Kirchner was provided with a verbal description of the nature and purpose of the present neuropsychological evaluation. Also reviewed were the foreseeable risks and/or discomforts and benefits of the procedure, limits of confidentiality, and mandatory reporting requirements of this provider. The patient was given the opportunity to ask questions and receive answers about the evaluation. Oral consent to participate was provided by the patient.   This evaluation was conducted by Christia Reading, Ph.D., ABPP-CN, board certified clinical  neuropsychologist. [name] [name]. Wallis completed a clinical interview with Dr. Melvyn Novas, billed as one unit [phone], and 180 minutes of cognitive testing and scoring, billed as one unit [phone] and five additional units [id number]. Psychometrist Cruzita Lederer, B.S., assisted Dr. Melvyn Novas with test administration and scoring procedures. As a separate and discrete service, Dr. Melvyn Novas spent a total of 160 minutes in interpretation and report writing billed as one unit [phone] and two units 96133.

## 2022-02-17 ENCOUNTER — Other Ambulatory Visit: Payer: Self-pay | Admitting: Internal Medicine

## 2022-02-20 ENCOUNTER — Encounter: Payer: Medicare Other | Admitting: Psychology

## 2022-02-20 ENCOUNTER — Ambulatory Visit: Payer: Medicare Other | Admitting: Psychology

## 2022-02-20 DIAGNOSIS — R413 Other amnesia: Secondary | ICD-10-CM

## 2022-02-20 DIAGNOSIS — F411 Generalized anxiety disorder: Secondary | ICD-10-CM

## 2022-02-20 NOTE — Progress Notes (Signed)
   Neuropsychology Feedback Session Tillie Rung. [facility]  Reason for Referral:   [name] [name] is a 67 y.o. right-handed Caucasian female referred by Sharene Butters, PA-C, to characterize her current cognitive functioning and assist with diagnostic clarity and treatment planning in the context of subjective cognitive decline.   Feedback:   [name] [name] completed a comprehensive neuropsychological evaluation on 02/14/2022. Please refer to that encounter for the full report and recommendations. Briefly, results suggested neuropsychological functioning largely within normal limits relative to age-matched peers. She exhibited an isolated impairment learning and later retrieving information from a lengthy list of words. However, she was able to learn and recall information well across three other memory tasks. Thus, this is not suggestive of a consistent impairment or weakness. Performance was also appropriate relative to age-matched peers across processing speed, attention/concentration, executive functioning, receptive and expressive language, visuospatial abilities, and recognition/consolidation aspects of memory. Despite some trouble across a list learning task, memory performances as a whole are not suggestive of Alzheimer's disease at the present time. Likewise, cognitive and behavioral characteristics are not suggestive of any other form of neurodegenerative illness presently.   [name] [name] was accompanied by her husband during the current feedback session. Content of the current session focused on the results of her neuropsychological evaluation. [name] [name] was given the opportunity to ask questions and her questions were answered. She was encouraged to reach out should additional questions arise. A copy of her report was provided at the conclusion of the visit.      25 minutes were spent conducting the current feedback session with [name], billed as one unit [phone].

## 2022-03-07 DIAGNOSIS — R61 Generalized hyperhidrosis: Secondary | ICD-10-CM | POA: Diagnosis not present

## 2022-03-23 ENCOUNTER — Telehealth: Payer: Self-pay | Admitting: Internal Medicine

## 2022-03-23 ENCOUNTER — Other Ambulatory Visit: Payer: Self-pay

## 2022-03-23 NOTE — Telephone Encounter (Signed)
Patient would like the generic version of levothyroxine (SYNTHROID) 112 MCG tablet sent to [facility] on [address]. She has already spoken with provider about switching to generic.

## 2022-03-24 MED ORDER — LEVOTHYROXINE SODIUM 112 MCG PO TABS: 112.0000 ug | ORAL_TABLET | Freq: Every day | ORAL | 3 refills | Status: DC

## 2022-03-24 NOTE — Telephone Encounter (Signed)
Pt requesting a generic for synthroid? she states it was already talked about

## 2022-04-12 DIAGNOSIS — Z85828 Personal history of other malignant neoplasm of skin: Secondary | ICD-10-CM | POA: Diagnosis not present

## 2022-04-12 DIAGNOSIS — L821 Other seborrheic keratosis: Secondary | ICD-10-CM | POA: Diagnosis not present

## 2022-04-12 DIAGNOSIS — L578 Other skin changes due to chronic exposure to nonionizing radiation: Secondary | ICD-10-CM | POA: Diagnosis not present

## 2022-04-12 DIAGNOSIS — L570 Actinic keratosis: Secondary | ICD-10-CM | POA: Diagnosis not present

## 2022-04-12 DIAGNOSIS — C4441 Basal cell carcinoma of skin of scalp and neck: Secondary | ICD-10-CM | POA: Diagnosis not present

## 2022-04-12 DIAGNOSIS — L648 Other androgenic alopecia: Secondary | ICD-10-CM | POA: Diagnosis not present

## 2022-04-17 ENCOUNTER — Other Ambulatory Visit: Payer: Self-pay | Admitting: Internal Medicine

## 2022-05-02 ENCOUNTER — Encounter: Payer: Self-pay | Admitting: Internal Medicine

## 2022-05-02 NOTE — Progress Notes (Unsigned)
Virtual Visit via telephone note  I connected with [name] [name] on 05/02/22 at  8:50 AM EDT by a video enabled telemedicine application.  We are both able to see each other and she was able to hear me, but I was not able to hear her, therefore I had to finish the visit on telephone. I am speaking with the correct person using two identifiers.   I discussed the limitations of evaluation and management by telemedicine and the availability of in person appointments. The patient expressed understanding and agreed to proceed.  Present for the visit:  Myself, Dr Cheryll Cockayne, [place] B[place]foot.  The patient is currently at home and I am in the office.    No referring provider.    History of Present Illness: She is here for an acute visit for cold symptoms.   She was on a disney cruise with her grandkids and got sick..  Her symptoms started 4 days - getting worse.  It feels like one of her sinus infections.  Has been a little cold, but the rest of the family are not sick.  She is experiencing nasal congestion, nosebleeds, sinus pain, cough that is a little productive of sputum, body aches and headaches.  She denies any fevers.  She denies any change in taste or smell.  She denies any shortness of breath.  She has tried taking flonase, mucinex   She also wanted to discuss her polycythemia.  Review of Systems  Constitutional:  Negative for fever.  HENT:  Positive for congestion, nosebleeds and sinus pain. Negative for ear pain and sore throat.        No change in taste / smell  Respiratory:  Positive for cough and sputum production. Negative for shortness of breath and wheezing.   Gastrointestinal:  Negative for diarrhea and nausea.  Musculoskeletal:  Positive for myalgias.  Neurological:  Positive for headaches. Negative for dizziness.      Social History   Socioeconomic History   Marital status: Married    Spouse name: Leonette Most   Number of children: 2   Years of education: 14    Highest education level: Associate degree: academic program  Occupational History   Occupation: Retired  Tobacco Use   Smoking status: Never   Smokeless tobacco: Never  Building services engineer Use: Never used  Substance and Sexual Activity   Alcohol use: Not Currently    Comment: rare   Drug use: No   Sexual activity: Yes  Other Topics Concern   Not on file  Social History Narrative   Not exercising regularly   Right handed   Caffeine yes   Two story home    Social Determinants of Health   Financial Resource Strain: Low Risk  (01/04/2022)   Overall Financial Resource Strain (CARDIA)    Difficulty of Paying Living Expenses: Not hard at all  Food Insecurity: No Food Insecurity (01/04/2022)   Hunger Vital Sign    Worried About Running Out of Food in the Last Year: Never true    Ran Out of Food in the Last Year: Never true  Transportation Needs: No Transportation Needs (01/04/2022)   PRAPARE - Administrator, Civil Service (Medical): No    Lack of Transportation (Non-Medical): No  Physical Activity: Sufficiently Active (01/04/2022)   Exercise Vital Sign    Days of Exercise per Week: 3 days    Minutes of Exercise per Session: 60 min  Stress: No Stress Concern Present (01/04/2022)  [employer] of Occupational Health - Occupational Stress Questionnaire    Feeling of Stress : Not at all  Social Connections: Moderately Isolated (01/04/2022)   Social Connection and Isolation Panel [NHANES]    Frequency of Communication with Friends and Family: More than three times a week    Frequency of Social Gatherings with Friends and Family: More than three times a week    Attends Religious Services: Never    Database administrator or Organizations: No    Attends Banker Meetings: Never    Marital Status: Married     Observations/Objective: Appears well in NAD when I saw her briefly on video   Assessment and Plan:  See Problem List for Assessment and  Plan of chronic medical problems.   Follow Up Instructions:    I discussed the assessment and treatment plan with the patient. The patient was provided an opportunity to ask questions and all were answered. The patient agreed with the plan and demonstrated an understanding of the instructions.   The patient was advised to call back or seek an in-person evaluation if the symptoms worsen or if the condition fails to improve as anticipated.  Time spent on telephone call: 13 minutes  Pincus Sanes, MD

## 2022-05-03 ENCOUNTER — Telehealth (INDEPENDENT_AMBULATORY_CARE_PROVIDER_SITE_OTHER): Payer: Medicare Other | Admitting: Internal Medicine

## 2022-05-03 DIAGNOSIS — R0683 Snoring: Secondary | ICD-10-CM | POA: Diagnosis not present

## 2022-05-03 DIAGNOSIS — D751 Secondary polycythemia: Secondary | ICD-10-CM | POA: Diagnosis not present

## 2022-05-03 DIAGNOSIS — J0100 Acute maxillary sinusitis, unspecified: Secondary | ICD-10-CM

## 2022-05-03 MED ORDER — AZITHROMYCIN 250 MG PO TABS: ORAL_TABLET | ORAL | 0 refills | Status: DC

## 2022-05-03 NOTE — Assessment & Plan Note (Signed)
Chronic Snores Denies waking up choking or gasping for air, husband denies apnea, sleep is refreshing She does have polycythemia therefore will refer to neurology for evaluation of sleep apnea

## 2022-05-03 NOTE — Assessment & Plan Note (Signed)
Chronic Has polycythemia Advised at her last appointment and after blood work that she consider getting evaluated for sleep apnea since this was a cause of polycythemia-if no sleep apnea may need to consider hematology evaluation She states she does snore.  Her husband denies any apnea.  She finds her sleep refreshing Will refer to neurology for evaluation of sleep apnea

## 2022-05-03 NOTE — Assessment & Plan Note (Signed)
Acute °Likely bacterial  °Start  zpak °otc cold medications °Rest, fluid °Call if no improvement ° °

## 2022-05-18 ENCOUNTER — Other Ambulatory Visit: Payer: Self-pay | Admitting: Internal Medicine

## 2022-05-29 ENCOUNTER — Ambulatory Visit: Payer: Medicare Other | Admitting: Neurology

## 2022-05-29 ENCOUNTER — Encounter: Payer: Self-pay | Admitting: Neurology

## 2022-05-29 VITALS — BP 142/78 | HR 89 | Ht 66.0 in | Wt 232.0 lb

## 2022-05-29 DIAGNOSIS — D751 Secondary polycythemia: Secondary | ICD-10-CM | POA: Diagnosis not present

## 2022-05-29 DIAGNOSIS — R351 Nocturia: Secondary | ICD-10-CM

## 2022-05-29 DIAGNOSIS — Z820 Family history of epilepsy and other diseases of the nervous system: Secondary | ICD-10-CM

## 2022-05-29 DIAGNOSIS — E669 Obesity, unspecified: Secondary | ICD-10-CM | POA: Diagnosis not present

## 2022-05-29 DIAGNOSIS — Z9189 Other specified personal risk factors, not elsewhere classified: Secondary | ICD-10-CM

## 2022-05-29 DIAGNOSIS — R0683 Snoring: Secondary | ICD-10-CM | POA: Diagnosis not present

## 2022-05-29 NOTE — Patient Instructions (Signed)

## 2022-05-29 NOTE — Progress Notes (Signed)
Subjective:    Patient ID: [name] [name] is a 67 y.o. female.  HPI    Huston Foley, MD, PhD [facility] [address] [address] [place], [place] [address]  Dear Dr. Lawerance Bach,  I saw your patient, [name] [name], upon your kind request in my sleep clinic today for initial consultation of her sleep disorder, in particular, concern for underlying obstructive sleep apnea.  The patient is unaccompanied today.  As you know, [name] [name] is a 67 year old female with an underlying medical history of hypertension, hyperlipidemia, hypothyroidism, iron deficiency, arthritis, allergies, hepatic steatosis, herniated cervical disc, memory loss (followed by [facility] neurology), cholelithiasis, polycythemia, and obesity, who reports snoring and nocturia. Her Epworth sleepiness score is 3 out of 24, fatigue severity score is 36 out of 63.  I reviewed your telemedicine note from 05/03/22.  She was recently evaluated at [facility] neurology for her cognitive concerns.  She has been trying to exercise on a regular basis and is working on weight loss, weight has been more or less stable.  Her son has sleep apnea and uses a CPAP machine.  She has had elevated hemoglobin and hematocrit values for the past several years. Her bedtime is around 10 PM but she likes to watch some TV in bed, it is on a sleep timer, does not tend to stay on all night.  She usually falls asleep around 11:30 PM.  Rise time is around 7:30 AM.  She lives with her husband, she has 1 son, 1 daughter, she has 3 granddaughters and 1 grandson.  They have a cat in the household.  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  She is a restless sleeper and endorses some symptoms of restless leg syndrome.  She limits her caffeine to 1 or 2 cups of coffee in the morning.  She is a non-smoker.  She drinks alcohol rarely, maybe twice a year.  She has nocturia about twice per average night,  denies with morning headaches or nocturnal headaches.  Her Past Medical History Is Significant For: Past Medical History:  Diagnosis Date   Allergic rhinoconjunctivitis, seasonal and perennial 07/02/2012   Arthritis    Bartholin's gland abscess 08/08/2021   left side, 5cm, abundant pus expressed, word catheter placed, f/u 1 week left side, 5cm, abundant pus expressed, word catheter placed, f/u 1 week- word catheter removed at pt request   Calculus of gallbladder 09/11/2008   Cholelithiasis    Dysmetabolic syndrome X 09/11/2008   Dysrhythmia    Essential hypertension 09/11/2007   Fatty liver disease, nonalcoholic 09/11/2008   Generalized anxiety disorder 03/28/2015   Tremors on 100 mg sertraline - improved with 50 mg dose weaning off benzo   Heart murmur    Hepatic steatosis    Herniated cervical disc 06/17/2015   Numbness in left arm  Improved with PT   History of basal cell carcinoma 05/09/2006   History of colonic polyps 02/09/2010   Adenomatous   Hyperhidrosis 07/21/2020   Intermittent-had only sweats-?  Related to anxiety   Hyperlipemia    Hyperlipidemia 09/11/2007   Hypothyroidism    Iron deficiency 02/06/2017   Localized osteoarthritis of right knee 01/04/2022   Lumbar radiculopathy 12/26/2018   Menopausal flushing 06/17/2020   worsening, reviewed risks of HRT and other non-hormonal options, pt would like to try Effexor and will contact PCP about option of clonidine. Also discussed black cohosh, soy supplements   OSA (obstructive sleep apnea) 09/06/2012  HST 2014:  AHI 8/hr  No CPAP; Dr Shelle Iron   Palpitations 06/17/2015   History of PVC  Full cardiac work up neg 2016  Propranolol prn   PONV (postoperative nausea and vomiting)    Type II diabetes mellitus 06/17/2015   Vitamin D deficiency 02/06/2017    Her Past Surgical History Is Significant For: Past Surgical History:  Procedure Laterality Date   ABDOMINAL HYSTERECTOMY     @ age 11, due to fibroids     COLONOSCOPY W/ POLYPECTOMY  01/24/2008   Dr.Jacobs, due 2015    DILATION AND CURETTAGE OF UTERUS     JOINT REPLACEMENT     left knee   MOHS SURGERY  01/23/2002   Basal Cell    TONSILLECTOMY     TOTAL KNEE ARTHROPLASTY  09/22/2011   Dr Tamala Bari, Left;   TRANSFORAMINAL LUMBAR INTERBODY FUSION W/ MIS 1 LEVEL N/A 02/02/2021   Procedure: Lumbar three-four Minimally Invasive Transforaminal Lumbar Interbody Fusion;  Surgeon: Jadene Pierini, MD;  Location: [facility] OR;  Service: Neurosurgery;  Laterality: N/A;    Her Family History Is Significant For: Family History  Problem Relation Age of Onset   Diabetes Mother    Leukemia Mother    High blood pressure Mother    Alcohol abuse Father    Lung cancer Father        smoker   Diabetes Brother    High blood pressure Maternal Uncle    Heart attack Maternal Uncle        X3 ; MIs in 30s & 40s   Stroke Maternal Uncle        in his 33s   High blood pressure Other        multiple uncles on maternal side   Sleep apnea Son    Adrenal disorder Neg Hx    Colon cancer Neg Hx    Colon polyps Neg Hx    Esophageal cancer Neg Hx    Rectal cancer Neg Hx    Stomach cancer Neg Hx     Her Social History Is Significant For: Social History   Socioeconomic History   Marital status: Married    Spouse name: Charles   Number of children: 2   Years of education: 14   Highest education level: Associate degree: academic program  Occupational History   Occupation: Retired  Tobacco Use   Smoking status: Never   Smokeless tobacco: Never  Building services engineer Use: Never used  Substance and Sexual Activity   Alcohol use: Not Currently    Comment: rare, once or twice a year at the most   Drug use: No   Sexual activity: Yes  Other Topics Concern   Not on file  Social History Narrative   Exercises: gym 2-3 times per week for 1-2 hours   Right handed   Caffeine: 1-2 cups coffee/day in the mornings    Two story home    Social Determinants of Health    Financial Resource Strain: Low Risk  (01/04/2022)   Overall Financial Resource Strain (CARDIA)    Difficulty of Paying Living Expenses: Not hard at all  Food Insecurity: No Food Insecurity (01/04/2022)   Hunger Vital Sign    Worried About Running Out of Food in the Last Year: Never true    Ran Out of Food in the Last Year: Never true  Transportation Needs: No Transportation Needs (01/04/2022)   PRAPARE - Administrator, Civil Service (Medical): No  Lack of Transportation (Non-Medical): No  Physical Activity: Sufficiently Active (01/04/2022)   Exercise Vital Sign    Days of Exercise per Week: 3 days    Minutes of Exercise per Session: 60 min  Stress: No Stress Concern Present (01/04/2022)   [employer] of Occupational Health - Occupational Stress Questionnaire    Feeling of Stress : Not at all  Social Connections: Moderately Isolated (01/04/2022)   Social Connection and Isolation Panel [NHANES]    Frequency of Communication with Friends and Family: More than three times a week    Frequency of Social Gatherings with Friends and Family: More than three times a week    Attends Religious Services: Never    Database administrator or Organizations: No    Attends Engineer, structural: Never    Marital Status: Married    Her Allergies Are:  Allergies  Allergen Reactions   Ampicillin Rash    rash   Doxycycline Other (See Comments)    Joint symptoms w/o rash or fever   Effexor [Venlafaxine] Other (See Comments)    Constipation, elevated BP   Oxycodone Nausea Only   Penicillins Rash  :   Her Current Medications Are:  Outpatient Encounter Medications as of 05/29/2022  Medication Sig   diclofenac (VOLTAREN) 75 MG EC tablet Take 1 tablet (75 mg total) by mouth 2 (two) times daily as needed.   finasteride (PROSCAR) 5 MG tablet Take 2.5 mg by mouth daily.   icosapent Ethyl (VASCEPA) 1 g capsule Take 2 capsules (2 g total) by mouth 2 (two) times daily.    levothyroxine (SYNTHROID) 112 MCG tablet Take 1 tablet (112 mcg total) by mouth daily.   losartan (COZAAR) 100 MG tablet TAKE 1 TABLET BY MOUTH DAILY   minoxidil (LONITEN) 2.5 MG tablet Take 2.5 mg by mouth daily.   Multiple Vitamins-Minerals (HAIR/SKIN/NAILS/BIOTIN PO) Take 1 tablet by mouth daily.   Polyvinyl Alcohol-Povidone PF (REFRESH) 1.4-0.6 % SOLN Place 1 drop into both eyes 3 (three) times daily as needed (dry eyes).   rosuvastatin (CRESTOR) 20 MG tablet TAKE 1 TABLET BY MOUTH DAILY   sertraline (ZOLOFT) 100 MG tablet TAKE 1 TABLET BY MOUTH DAILY   azelastine (ASTELIN) 0.1 % nasal spray Place 2 sprays into both nostrils 2 (two) times daily. (Patient not taking: Reported on 05/29/2022)   benzonatate (TESSALON) 100 MG capsule Take 1 capsule (100 mg total) by mouth 3 (three) times daily as needed. (Patient not taking: Reported on 05/29/2022)   busPIRone (BUSPAR) 5 MG tablet TAKE ONE TABLET BY MOUTH THREE TIMES A DAY (Patient not taking: Reported on 05/29/2022)   cholecalciferol (VITAMIN D3) 25 MCG (1000 UNIT) tablet Take 1,000 Units by mouth daily. (Patient not taking: Reported on 05/29/2022)   Ferrous Sulfate 27 MG TABS Take one pill every a few times a week (Patient not taking: Reported on 05/29/2022)   fluticasone (FLONASE) 50 MCG/ACT nasal spray Place 2 sprays into both nostrils daily. (Patient not taking: Reported on 05/29/2022)   vitamin B-12 (CYANOCOBALAMIN) 1000 MCG tablet Take 1,000 mcg by mouth 3 (three) times a week. (Patient not taking: Reported on 05/29/2022)   [DISCONTINUED] azithromycin (ZITHROMAX) 250 MG tablet Take two tabs the first day and then one tab daily for four days (Patient not taking: Reported on 05/29/2022)   [DISCONTINUED] sulfamethoxazole-trimethoprim (BACTRIM DS) 800-160 MG tablet Take 1 tablet by mouth 2 (two) times daily. (Patient not taking: Reported on 05/29/2022)   No facility-administered encounter medications on file as of  05/29/2022.  :   Review of Systems:  Out of a  complete 14 point review of systems, all are reviewed and negative with the exception of these symptoms as listed below:  Review of Systems  Neurological:        Patient is here alone for sleep consult. She states she wakes herself up snoring maybe 1-2 x per month. Her husband tells her that she snores. She denies any trouble sleeping. She is not sleepy during the day. Patient states she had some sort of test years ago (20-25 years) but doesn't remember any details (was possibly a take home test?). Of note, she did see Dr Kieth Brightly recently due to memory difficulties but she state she was told she was fine. ESS 3 FSS 36.    Objective:  Neurological Exam  Physical Exam Physical Examination:   Vitals:   05/29/22 1336  BP: (!) 142/78  Pulse: 89    General Examination: The patient is a very pleasant 67 y.o. female in no acute distress. She appears well-developed and well-nourished and well groomed.   HEENT: Normocephalic, atraumatic, pupils are equal, round and reactive to light, extraocular tracking is good without limitation to gaze excursion or nystagmus noted. Hearing is grossly intact. Face is symmetric with normal facial animation. Speech is clear with no dysarthria noted. There is no hypophonia. There is no lip, neck/head, jaw or voice tremor. Neck is supple with full range of passive and active motion. There are no carotid bruits on auscultation. Oropharynx exam reveals: mild mouth dryness, adequate dental hygiene and moderate airway crowding, due to larger uvula, larger tongue noted.  Mallampati class III.  Tonsils absent.  Small airway entry noted.  Tongue protrudes centrally and palate elevates symmetrically.  Neck circumference 17-1/2 inches, moderate to significant overbite noted.  Chest: Clear to auscultation without wheezing, rhonchi or crackles noted.  Heart: S1+S2+0, regular and normal without murmurs, rubs or gallops noted.   Abdomen: Soft, non-tender and  non-distended.  Extremities: There is no obvious edema in the distal lower extremities bilaterally.   Skin: Warm and dry without trophic changes noted.   Musculoskeletal: exam reveals no obvious joint deformities.   Neurologically:  Mental status: The patient is awake, alert and oriented in all 4 spheres. Her immediate and remote memory, attention, language skills and fund of knowledge are appropriate. There is no evidence of aphasia, agnosia, apraxia or anomia. Speech is clear with normal prosody and enunciation. Thought process is linear. Mood is normal and affect is normal.  Cranial nerves II - XII are as described above under HEENT exam.  Motor exam: Normal bulk, strength and tone is noted. There is no obvious action or resting tremor.  Fine motor skills and coordination: grossly intact.  Cerebellar testing: No dysmetria or intention tremor. There is no truncal or gait ataxia.  Sensory exam: intact to light touch in the upper and lower extremities.  Gait, station and balance: She stands easily. No veering to one side is noted. No leaning to one side is noted. Posture is age-appropriate and stance is narrow based. Gait shows normal stride length and normal pace. No problems turning are noted.   Assessment and Plan:  In summary, [name] [name] is a very pleasant 67 y.o.-year old female with an underlying medical history of hypertension, hyperlipidemia, hypothyroidism, iron deficiency, arthritis, allergies, hepatic steatosis, herniated cervical disc, memory loss (followed by [facility]), cholelithiasis, polycythemia, and obesity, whose history and physical exam are concerning for sleep disordered breathing, particularly  obstructive sleep apnea (OSA). While a laboratory attended sleep study is typically considered "gold standard" for evaluation of sleep disordered breathing, she would prefer a home sleep test.  She worries that she will not be able to sleep in the sleep lab.   I had a  long chat with the patient about my findings and the diagnosis of sleep apnea, particularly OSA, its prognosis and treatment options. We talked about medical/conservative treatments, surgical interventions and non-pharmacological approaches for symptom control. I explained, in particular, the risks and ramifications of untreated moderate to severe OSA, especially with respect to developing cardiovascular disease down the road, including congestive heart failure (CHF), difficult to treat hypertension, cardiac arrhythmias (particularly A-fib), neurovascular complications including TIA, stroke and dementia. Even type 2 diabetes has, in part, been linked to untreated OSA. Symptoms of untreated OSA may include (but may not be limited to) daytime sleepiness, nocturia (i.e. frequent nighttime urination), memory problems, mood irritability and suboptimally controlled or worsening mood disorder such as depression and/or anxiety, lack of energy, lack of motivation, physical discomfort, as well as recurrent headaches, especially morning or nocturnal headaches. We talked about the importance of maintaining a healthy lifestyle and striving for healthy weight.  In addition, we talked about the importance of striving for and maintaining good sleep hygiene. I recommended a sleep study at this time. I outlined the differences between a laboratory attended sleep study which is considered more comprehensive and accurate over the option of a home sleep test (HST); the latter may lead to underestimation of sleep disordered breathing in some instances and does not help with diagnosing upper airway resistance syndrome and is not accurate enough to diagnose primary central sleep apnea typically. I outlined possible surgical and non-surgical treatment options of OSA, including the use of a positive airway pressure (PAP) device (i.e. CPAP, AutoPAP/APAP or BiPAP in certain circumstances), a custom-made dental device (aka oral appliance,  which would require a referral to a specialist dentist or orthodontist typically, and is generally speaking not considered for patients with full dentures or edentulous state), upper airway surgical options, such as traditional UPPP (which is not considered a first-line treatment) or the Inspire device (hypoglossal nerve stimulator, which would involve a referral for consultation with an ENT surgeon, after careful selection, following inclusion criteria - also not first-line treatment). I explained the PAP treatment option to the patient in detail, as this is generally considered first-line treatment.  The patient indicated that she would be willing to try PAP therapy, if the need arises. I explained the importance of being compliant with PAP treatment, not only for insurance purposes but primarily to improve patient's symptoms symptoms, and for the patient's long term health benefit, including to reduce Her cardiovascular risks longer-term.    We will pick up our discussion about the next steps and treatment options after testing.  We will keep her posted as to the test results by phone call and/or MyChart messaging where possible.  We will plan to follow-up in sleep clinic accordingly as well.  I answered all her questions today and the patient was in agreement.   I encouraged her to call with any interim questions, concerns, problems or updates or email [place] through MyChart.  Generally speaking, sleep test authorizations may take up to 2 weeks, sometimes less, sometimes longer, the patient is encouraged to get in touch with [place] if they do not hear back from the sleep lab staff directly within the next 2 weeks.  Thank you very much  for allowing me to participate in the care of this nice patient. If I can be of any further assistance to you please do not hesitate to call me at [phone].  Sincerely,   Star Age, MD, PhD

## 2022-05-31 DIAGNOSIS — M542 Cervicalgia: Secondary | ICD-10-CM | POA: Diagnosis not present

## 2022-06-01 ENCOUNTER — Telehealth: Payer: Self-pay | Admitting: Neurology

## 2022-06-01 NOTE — Telephone Encounter (Signed)
HST- [employer] medicare no auth req   Patient is scheduled at [facility] for 06/21/22 at 8 AM'  Mailed packet to the patient.

## 2022-06-09 DIAGNOSIS — M50121 Cervical disc disorder at C4-C5 level with radiculopathy: Secondary | ICD-10-CM | POA: Diagnosis not present

## 2022-06-09 DIAGNOSIS — M50123 Cervical disc disorder at C6-C7 level with radiculopathy: Secondary | ICD-10-CM | POA: Diagnosis not present

## 2022-06-09 DIAGNOSIS — M50122 Cervical disc disorder at C5-C6 level with radiculopathy: Secondary | ICD-10-CM | POA: Diagnosis not present

## 2022-06-14 DIAGNOSIS — M50121 Cervical disc disorder at C4-C5 level with radiculopathy: Secondary | ICD-10-CM | POA: Diagnosis not present

## 2022-06-14 DIAGNOSIS — M50122 Cervical disc disorder at C5-C6 level with radiculopathy: Secondary | ICD-10-CM | POA: Diagnosis not present

## 2022-06-14 DIAGNOSIS — M50123 Cervical disc disorder at C6-C7 level with radiculopathy: Secondary | ICD-10-CM | POA: Diagnosis not present

## 2022-06-21 ENCOUNTER — Ambulatory Visit (INDEPENDENT_AMBULATORY_CARE_PROVIDER_SITE_OTHER): Payer: Medicare Other | Admitting: Neurology

## 2022-06-21 DIAGNOSIS — Z9189 Other specified personal risk factors, not elsewhere classified: Secondary | ICD-10-CM

## 2022-06-21 DIAGNOSIS — G4733 Obstructive sleep apnea (adult) (pediatric): Secondary | ICD-10-CM

## 2022-06-21 DIAGNOSIS — R0683 Snoring: Secondary | ICD-10-CM

## 2022-06-21 DIAGNOSIS — M50122 Cervical disc disorder at C5-C6 level with radiculopathy: Secondary | ICD-10-CM | POA: Diagnosis not present

## 2022-06-21 DIAGNOSIS — M50123 Cervical disc disorder at C6-C7 level with radiculopathy: Secondary | ICD-10-CM | POA: Diagnosis not present

## 2022-06-21 DIAGNOSIS — M50121 Cervical disc disorder at C4-C5 level with radiculopathy: Secondary | ICD-10-CM | POA: Diagnosis not present

## 2022-06-21 DIAGNOSIS — E669 Obesity, unspecified: Secondary | ICD-10-CM

## 2022-06-21 DIAGNOSIS — D751 Secondary polycythemia: Secondary | ICD-10-CM

## 2022-06-21 DIAGNOSIS — R351 Nocturia: Secondary | ICD-10-CM

## 2022-06-21 DIAGNOSIS — Z820 Family history of epilepsy and other diseases of the nervous system: Secondary | ICD-10-CM

## 2022-06-22 NOTE — Procedures (Signed)
[facility]  HOME SLEEP TEST (Watch PAT) REPORT  STUDY DATE: 06/21/2022  DOB: 09/01/1955  MRN: [id number]  ORDERING CLINICIAN: Huston Foley, MD, PhD   REFERRING CLINICIAN: Pincus Sanes, MD   CLINICAL INFORMATION/HISTORY: 67 year old female with an underlying medical history of hypertension, hyperlipidemia, hypothyroidism, iron deficiency, arthritis, allergies, hepatic steatosis, herniated cervical disc, memory loss (followed by [facility] neurology), cholelithiasis, polycythemia, and obesity, who reports snoring and nocturia.   Epworth sleepiness score: 3/24.  BMI: 37.2 kg/m  FINDINGS:   Sleep Summary:   Total Recording Time (hours, min): 9 hours, 4 min  Total Sleep Time (hours, min):  7 hours, 28 min  Percent REM (%):    25.4%   Respiratory Indices:   Calculated pAHI (per hour):  23/hour   -    utilizing the 4% desaturation criteria for hypopneas per Medicare guidelines.         REM pAHI:    21.8/hour       NREM pAHI: 23.4/hour  Central pAHI: 2.7/hour  Oxygen Saturation Statistics:    Oxygen Saturation (%) Mean: 94%   Minimum oxygen saturation (%):            80     %   O2 Saturation Range (%): 80 - 98%    O2 Saturation (minutes) <=88%: 0.5 min  Pulse Rate Statistics:   Pulse Mean (bpm):    66/min    Pulse Range (49 - 110/min)   IMPRESSION: OSA (obstructive sleep apnea), moderate  RECOMMENDATION:  This home sleep test demonstrates moderate obstructive sleep apnea with a total AHI of 23/hour (utilizing the 4% desaturation criteria for hypopneas per Medicare guidelines) and O2 nadir of 80%.  Moderate to loud snoring was detected fairly consistently throughout the study, at times in the mild range. Treatment with a positive airway pressure (PAP) device is recommended. The patient will be advised to proceed with an autoPAP titration/trial at home for now. A full night titration study may be considered to optimize treatment settings, monitor  proper oxygen saturations and aid with improvement of tolerance and adherence, if needed down the road. Alternative treatment options may include a dental device through dentistry or orthodontics in selected patients or Inspire (hypoglossal nerve stimulator) in carefully selected patients (meeting inclusion criteria).  Concomitant weight loss is recommended (where clinically appropriate). Please note that untreated obstructive sleep apnea may carry additional perioperative morbidity. Patients with significant obstructive sleep apnea should receive perioperative PAP therapy and the surgeons and particularly the anesthesiologist should be informed of the diagnosis and the severity of the sleep disordered breathing. The patient should be cautioned not to drive, work at heights, or operate dangerous or heavy equipment when tired or sleepy. Review and reiteration of good sleep hygiene measures should be pursued with any patient. Other causes of the patient's symptoms, including circadian rhythm disturbances, an underlying mood disorder, medication effect and/or an underlying medical problem cannot be ruled out based on this test. Clinical correlation is recommended.  The patient and her referring provider will be notified of the test results. The patient will be seen in follow up in sleep clinic at [facility].  I certify that I have reviewed the raw data recording prior to the issuance of this report in accordance with the standards of the American Academy of Sleep Medicine (AASM).  INTERPRETING PHYSICIAN:   Huston Foley, MD, PhD Medical Director, [facility]) Diplomat, ABPN (Neurology and Sleep)   [facility] [phone] [address]  [address], [address] [place], [place] [address] [phone]

## 2022-06-22 NOTE — Addendum Note (Signed)
Addended by: Huston Foley on: 06/22/2022 07:21 PM   Modules accepted: Orders

## 2022-06-22 NOTE — Progress Notes (Signed)
See procedure note.

## 2022-06-26 ENCOUNTER — Telehealth: Payer: Self-pay | Admitting: *Deleted

## 2022-06-26 NOTE — Telephone Encounter (Signed)
-----   Message from Huston Foley, MD sent at 06/22/2022  7:21 PM EDT ----- Patient referred by PCP for OSA concern, seen by me on 05/29/2022, patient had a HST on 06/21/2022.    Please call and notify the patient that the recent home sleep test showed obstructive sleep apnea in the moderate range. I recommend treatment in the form of autoPAP, which means, that we don't have to bring her in for a sleep study with CPAP, but will let her start using a so called autoPAP machine at home, which is a CPAP-like machine with self-adjusting pressures. We will send the order to a local DME company (of her choice, or as per insurance requirement). The DME representative will fit her with a mask, educate her on how to use the machine, how to put the mask on, etc. I have placed an order in the chart. Please send the order, talk to patient, send report to referring MD. We will need a FU in sleep clinic for 10 weeks post-PAP set up, please arrange that with me or one of our NPs. Also reinforce the need for compliance with treatment. Thanks,   Huston Foley, MD, PhD [facility])

## 2022-06-26 NOTE — Telephone Encounter (Signed)
I called pt. I advised pt that Dr. Frances Furbish reviewed their sleep study results and found that pt has moderate OSA. Dr. Frances Furbish recommends that pt start autopap. I reviewed PAP compliance expectations with the pt. Pt is agreeable to starting an auto-PAP. I advised pt that an order will be sent to a DME, [employer] , and they will call the pt within about one week after they file with the pt's insurance. They will show the pt how to use the machine, fit for masks, and troubleshoot the auto-PAP if needed. A follow up appt will be made for insurance purposes with a provider..  Pt verbalized understanding of results. Pt had no questions at this time but was encouraged to call back if questions arise. I have sent the order to [employer] and have received confirmation that they have received the order.

## 2022-06-27 ENCOUNTER — Telehealth: Payer: Self-pay | Admitting: Neurology

## 2022-06-27 NOTE — Telephone Encounter (Signed)
New, Doristine Mango, RN; Penni Homans; Marveen Reeks; Santina Evans Received, thank you!       Previous Messages    ----- Message ----- From: Guy Begin, RN Sent: 06/26/2022   4:18 PM EDT To: Henderson Newcomer; Kathyrn Sheriff; Santina Evans; * Subject: new autopap user  (lives in [place], [place])  [place]*  New order for autopap  [name] [name]. [name] [name], 66 y.o., 08-30-1955 MRN: [id number] Phone: [phone]  Thank you Andrey Campanile RN

## 2022-06-27 NOTE — Telephone Encounter (Signed)
10 week initial CPAP f/u scheduled, pt declined dates that were closer to the 10 week mark due to those times being too early in the day.  Pt advised to bring CPAP and power cord to appointment 10-04-22

## 2022-06-28 DIAGNOSIS — M50121 Cervical disc disorder at C4-C5 level with radiculopathy: Secondary | ICD-10-CM | POA: Diagnosis not present

## 2022-06-28 DIAGNOSIS — M50122 Cervical disc disorder at C5-C6 level with radiculopathy: Secondary | ICD-10-CM | POA: Diagnosis not present

## 2022-06-28 DIAGNOSIS — M50123 Cervical disc disorder at C6-C7 level with radiculopathy: Secondary | ICD-10-CM | POA: Diagnosis not present

## 2022-06-30 DIAGNOSIS — M50123 Cervical disc disorder at C6-C7 level with radiculopathy: Secondary | ICD-10-CM | POA: Diagnosis not present

## 2022-06-30 DIAGNOSIS — M50122 Cervical disc disorder at C5-C6 level with radiculopathy: Secondary | ICD-10-CM | POA: Diagnosis not present

## 2022-06-30 DIAGNOSIS — M50121 Cervical disc disorder at C4-C5 level with radiculopathy: Secondary | ICD-10-CM | POA: Diagnosis not present

## 2022-07-03 DIAGNOSIS — M50121 Cervical disc disorder at C4-C5 level with radiculopathy: Secondary | ICD-10-CM | POA: Diagnosis not present

## 2022-07-03 DIAGNOSIS — M50123 Cervical disc disorder at C6-C7 level with radiculopathy: Secondary | ICD-10-CM | POA: Diagnosis not present

## 2022-07-03 DIAGNOSIS — M50122 Cervical disc disorder at C5-C6 level with radiculopathy: Secondary | ICD-10-CM | POA: Diagnosis not present

## 2022-07-05 DIAGNOSIS — M50121 Cervical disc disorder at C4-C5 level with radiculopathy: Secondary | ICD-10-CM | POA: Diagnosis not present

## 2022-07-05 DIAGNOSIS — M50122 Cervical disc disorder at C5-C6 level with radiculopathy: Secondary | ICD-10-CM | POA: Diagnosis not present

## 2022-07-05 DIAGNOSIS — M50123 Cervical disc disorder at C6-C7 level with radiculopathy: Secondary | ICD-10-CM | POA: Diagnosis not present

## 2022-07-06 DIAGNOSIS — G4733 Obstructive sleep apnea (adult) (pediatric): Secondary | ICD-10-CM | POA: Diagnosis not present

## 2022-07-07 ENCOUNTER — Other Ambulatory Visit: Payer: Self-pay | Admitting: Internal Medicine

## 2022-07-08 ENCOUNTER — Other Ambulatory Visit: Payer: Self-pay | Admitting: Internal Medicine

## 2022-07-10 ENCOUNTER — Encounter: Payer: Self-pay | Admitting: Internal Medicine

## 2022-07-11 DIAGNOSIS — M50122 Cervical disc disorder at C5-C6 level with radiculopathy: Secondary | ICD-10-CM | POA: Diagnosis not present

## 2022-07-11 DIAGNOSIS — M50121 Cervical disc disorder at C4-C5 level with radiculopathy: Secondary | ICD-10-CM | POA: Diagnosis not present

## 2022-07-11 DIAGNOSIS — G4733 Obstructive sleep apnea (adult) (pediatric): Secondary | ICD-10-CM | POA: Diagnosis not present

## 2022-07-11 DIAGNOSIS — M50123 Cervical disc disorder at C6-C7 level with radiculopathy: Secondary | ICD-10-CM | POA: Diagnosis not present

## 2022-07-13 DIAGNOSIS — M50123 Cervical disc disorder at C6-C7 level with radiculopathy: Secondary | ICD-10-CM | POA: Diagnosis not present

## 2022-07-13 DIAGNOSIS — M50121 Cervical disc disorder at C4-C5 level with radiculopathy: Secondary | ICD-10-CM | POA: Diagnosis not present

## 2022-07-13 DIAGNOSIS — M50122 Cervical disc disorder at C5-C6 level with radiculopathy: Secondary | ICD-10-CM | POA: Diagnosis not present

## 2022-07-16 NOTE — Progress Notes (Unsigned)
Subjective:    Patient ID: [name] [name], female    DOB: [birth year], 67 y.o.   MRN: [id number]     HPI [name] [name] is here for follow up of her chronic medical problems.  Diagnosed with sleep apnea 05/2022-using cpap just over one week - likely the cause of her polycythemia.  She will be moving down in [place] at the end of this week, but plans on coming back here for her care.  Medications and allergies reviewed with patient and updated if appropriate.  Current Outpatient Medications on File Prior to Visit  Medication Sig Dispense Refill   diclofenac (VOLTAREN) 75 MG EC tablet Take 1 tablet (75 mg total) by mouth 2 (two) times daily as needed. 60 tablet 3   finasteride (PROSCAR) 5 MG tablet Take 2.5 mg by mouth daily.     icosapent Ethyl (VASCEPA) 1 g capsule Take 2 capsules (2 g total) by mouth 2 (two) times daily. 120 capsule 5   levothyroxine (SYNTHROID) 112 MCG tablet Take 1 tablet (112 mcg total) by mouth daily. 90 tablet 3   losartan (COZAAR) 100 MG tablet TAKE 1 TABLET BY MOUTH DAILY 90 tablet 0   minoxidil (LONITEN) 2.5 MG tablet Take 2.5 mg by mouth daily.     Multiple Vitamins-Minerals (HAIR/SKIN/NAILS/BIOTIN PO) Take 1 tablet by mouth daily.     Polyvinyl Alcohol-Povidone PF (REFRESH) 1.4-0.6 % SOLN Place 1 drop into both eyes 3 (three) times daily as needed (dry eyes).     rosuvastatin (CRESTOR) 20 MG tablet TAKE 1 TABLET BY MOUTH DAILY 90 tablet 2   sertraline (ZOLOFT) 100 MG tablet TAKE 1 TABLET BY MOUTH DAILY 90 tablet 2   No current facility-administered medications on file prior to visit.     Review of Systems  Constitutional:  Negative for fever.  Respiratory:  Negative for cough, shortness of breath and wheezing.   Cardiovascular:  Positive for palpitations (occ with anxiety). Negative for chest pain and leg swelling.  Musculoskeletal:  Positive for back pain (R lower back pain).  Neurological:  Negative for light-headedness and headaches.        Objective:   Vitals:   07/17/22 0956  BP: 120/68  Pulse: 82  Temp: 98.2 F (36.8 C)  SpO2: 95%   BP Readings from Last 3 Encounters:  07/17/22 120/68  05/29/22 (!) 142/78  01/04/22 130/84   Wt Readings from Last 3 Encounters:  07/17/22 230 lb (104.3 kg)  05/29/22 232 lb (105.2 kg)  01/04/22 228 lb (103.4 kg)   Body mass index is 37.12 kg/m.    Physical Exam Constitutional:      General: She is not in acute distress.    Appearance: Normal appearance.  HENT:     Head: Normocephalic and atraumatic.  Eyes:     Conjunctiva/sclera: Conjunctivae normal.  Cardiovascular:     Rate and Rhythm: Normal rate and regular rhythm.     Heart sounds: Murmur (2/6 sys) heard.  Pulmonary:     Effort: Pulmonary effort is normal. No respiratory distress.     Breath sounds: Normal breath sounds. No wheezing.  Musculoskeletal:     Cervical back: Neck supple.     Right lower leg: No edema.     Left lower leg: No edema.  Lymphadenopathy:     Cervical: No cervical adenopathy.  Skin:    General: Skin is warm and dry.     Findings: No rash.  Neurological:     Mental Status:  She is alert. Mental status is at baseline.  Psychiatric:        Mood and Affect: Mood normal.        Behavior: Behavior normal.        Lab Results  Component Value Date   WBC 8.0 01/04/2022   HGB 16.3 (H) 01/04/2022   HCT 49.1 (H) 01/04/2022   PLT 328.0 01/04/2022   GLUCOSE 111 (H) 01/04/2022   CHOL 159 01/04/2022   TRIG 207.0 (H) 01/04/2022   HDL 44.40 01/04/2022   LDLDIRECT 94.0 01/04/2022   LDLCALC 88 09/30/2020   ALT 20 01/04/2022   AST 21 01/04/2022   NA 142 01/04/2022   K 4.6 01/04/2022   CL 103 01/04/2022   CREATININE 0.88 01/04/2022   BUN 14 01/04/2022   CO2 31 01/04/2022   TSH 1.90 01/04/2022   INR 1.00 09/19/2011   HGBA1C 6.6 (H) 01/04/2022   MICROALBUR <0.7 07/04/2021     Assessment & Plan:    See Problem List for Assessment and Plan of chronic medical problems.

## 2022-07-16 NOTE — Patient Instructions (Addendum)
      Blood work was ordered.   The lab is on the first floor.    Medications changes include :   none      Return in about 6 months (around 01/16/2023) for Physical Exam.  

## 2022-07-17 ENCOUNTER — Encounter: Payer: Self-pay | Admitting: Internal Medicine

## 2022-07-17 ENCOUNTER — Ambulatory Visit (INDEPENDENT_AMBULATORY_CARE_PROVIDER_SITE_OTHER): Payer: Medicare Other | Admitting: Internal Medicine

## 2022-07-17 VITALS — BP 120/68 | HR 82 | Temp 98.2°F | Ht 66.0 in | Wt 230.0 lb

## 2022-07-17 DIAGNOSIS — G4733 Obstructive sleep apnea (adult) (pediatric): Secondary | ICD-10-CM

## 2022-07-17 DIAGNOSIS — D751 Secondary polycythemia: Secondary | ICD-10-CM | POA: Diagnosis not present

## 2022-07-17 DIAGNOSIS — E559 Vitamin D deficiency, unspecified: Secondary | ICD-10-CM | POA: Diagnosis not present

## 2022-07-17 DIAGNOSIS — E7849 Other hyperlipidemia: Secondary | ICD-10-CM | POA: Diagnosis not present

## 2022-07-17 DIAGNOSIS — E1169 Type 2 diabetes mellitus with other specified complication: Secondary | ICD-10-CM

## 2022-07-17 DIAGNOSIS — E039 Hypothyroidism, unspecified: Secondary | ICD-10-CM

## 2022-07-17 DIAGNOSIS — F411 Generalized anxiety disorder: Secondary | ICD-10-CM

## 2022-07-17 DIAGNOSIS — I10 Essential (primary) hypertension: Secondary | ICD-10-CM | POA: Diagnosis not present

## 2022-07-17 LAB — VITAMIN D 25 HYDROXY (VIT D DEFICIENCY, FRACTURES): VITD: 32.97 ng/mL (ref 30.00–100.00)

## 2022-07-17 LAB — HEMOGLOBIN A1C: Hgb A1c MFr Bld: 6.2 % (ref 4.6–6.5)

## 2022-07-17 LAB — CBC WITH DIFFERENTIAL/PLATELET
Basophils Absolute: 0.1 10*3/uL (ref 0.0–0.1)
Basophils Relative: 1 % (ref 0.0–3.0)
Eosinophils Absolute: 0.1 10*3/uL (ref 0.0–0.7)
Eosinophils Relative: 2.2 % (ref 0.0–5.0)
HCT: 46.4 % — ABNORMAL HIGH (ref 36.0–46.0)
Hemoglobin: 15.1 g/dL — ABNORMAL HIGH (ref 12.0–15.0)
Lymphocytes Relative: 29.5 % (ref 12.0–46.0)
Lymphs Abs: 1.6 10*3/uL (ref 0.7–4.0)
MCHC: 32.7 g/dL (ref 30.0–36.0)
MCV: 88.7 fl (ref 78.0–100.0)
Monocytes Absolute: 0.5 10*3/uL (ref 0.1–1.0)
Monocytes Relative: 9.7 % (ref 3.0–12.0)
Neutro Abs: 3 10*3/uL (ref 1.4–7.7)
Neutrophils Relative %: 57.6 % (ref 43.0–77.0)
Platelets: 315 10*3/uL (ref 150.0–400.0)
RBC: 5.23 Mil/uL — ABNORMAL HIGH (ref 3.87–5.11)
RDW: 13.5 % (ref 11.5–15.5)
WBC: 5.3 10*3/uL (ref 4.0–10.5)

## 2022-07-17 LAB — COMPREHENSIVE METABOLIC PANEL
ALT: 20 U/L (ref 0–35)
AST: 22 U/L (ref 0–37)
Albumin: 4.4 g/dL (ref 3.5–5.2)
Alkaline Phosphatase: 61 U/L (ref 39–117)
BUN: 16 mg/dL (ref 6–23)
CO2: 30 mEq/L (ref 19–32)
Calcium: 10 mg/dL (ref 8.4–10.5)
Chloride: 102 mEq/L (ref 96–112)
Creatinine, Ser: 0.9 mg/dL (ref 0.40–1.20)
GFR: 66.29 mL/min (ref >60.00)
Glucose, Bld: 114 mg/dL — ABNORMAL HIGH (ref 70–99)
Potassium: 4.6 mEq/L (ref 3.5–5.1)
Sodium: 140 mEq/L (ref 135–145)
Total Bilirubin: 0.7 mg/dL (ref 0.2–1.2)
Total Protein: 7.2 g/dL (ref 6.0–8.3)

## 2022-07-17 LAB — LIPID PANEL
Cholesterol: 154 mg/dL (ref 0–200)
HDL: 33.9 mg/dL — ABNORMAL LOW (ref >39.00)
NonHDL: 119.98
Total CHOL/HDL Ratio: 5
Triglycerides: 277 mg/dL — ABNORMAL HIGH (ref 0.0–149.0)
VLDL: 55.4 mg/dL — ABNORMAL HIGH (ref 0.0–40.0)

## 2022-07-17 LAB — MICROALBUMIN / CREATININE URINE RATIO
Creatinine,U: 276.9 mg/dL
Microalb Creat Ratio: 1.5 mg/g (ref 0.0–30.0)
Microalb, Ur: 4.1 mg/dL — ABNORMAL HIGH (ref 0.0–1.9)

## 2022-07-17 LAB — TSH: TSH: 1.38 u[IU]/mL (ref 0.35–5.50)

## 2022-07-17 LAB — LDL CHOLESTEROL, DIRECT: Direct LDL: 83 mg/dL

## 2022-07-17 MED ORDER — ICOSAPENT ETHYL 1 G PO CAPS: 2.0000 g | ORAL_CAPSULE | Freq: Two times a day (BID) | ORAL | 3 refills | Status: DC

## 2022-07-17 NOTE — Assessment & Plan Note (Signed)
Chronic Likely related to OSA which is now being treated CBC

## 2022-07-17 NOTE — Assessment & Plan Note (Addendum)
Chronic Regular exercise and healthy diet encouraged Has not been taking the Vascepa because it was not refilled-refill sent in today Check lipid panel  Continue Crestor 20 mg daily, Vascepa 2 g twice daily

## 2022-07-17 NOTE — Assessment & Plan Note (Signed)
Chronic   Lab Results  Component Value Date   HGBA1C 6.6 (H) 01/04/2022   Sugars controlled Check A1c, urine microalbumin today Continue lifestyle control Stressed regular exercise, diabetic diet

## 2022-07-17 NOTE — Assessment & Plan Note (Signed)
Chronic Currently taking multivitamin Check vitamin D level

## 2022-07-17 NOTE — Assessment & Plan Note (Signed)
Chronic  Clinically euthyroid Check tsh and will titrate med dose if needed Currently taking levothyroxine 112 mcg daily 

## 2022-07-17 NOTE — Assessment & Plan Note (Signed)
Chronic Controlled, Stable Continue sertraline 100 mg daily 

## 2022-07-17 NOTE — Assessment & Plan Note (Addendum)
Chronic Has started CPAP-still getting used to it

## 2022-07-17 NOTE — Assessment & Plan Note (Signed)
Chronic Blood pressure well controlled CMP, CBC Continue losartan 100 mg daily 

## 2022-07-18 DIAGNOSIS — G4733 Obstructive sleep apnea (adult) (pediatric): Secondary | ICD-10-CM | POA: Diagnosis not present

## 2022-08-05 DIAGNOSIS — G4733 Obstructive sleep apnea (adult) (pediatric): Secondary | ICD-10-CM | POA: Diagnosis not present

## 2022-08-22 ENCOUNTER — Encounter: Payer: Self-pay | Admitting: Internal Medicine

## 2022-08-22 ENCOUNTER — Ambulatory Visit (INDEPENDENT_AMBULATORY_CARE_PROVIDER_SITE_OTHER): Payer: Medicare Other | Admitting: Internal Medicine

## 2022-08-22 VITALS — BP 126/80 | HR 82 | Temp 98.1°F | Ht 66.0 in | Wt 229.0 lb

## 2022-08-22 DIAGNOSIS — K219 Gastro-esophageal reflux disease without esophagitis: Secondary | ICD-10-CM

## 2022-08-22 DIAGNOSIS — R1013 Epigastric pain: Secondary | ICD-10-CM

## 2022-08-22 MED ORDER — FAMOTIDINE 40 MG PO TABS: 40.0000 mg | ORAL_TABLET | Freq: Every day | ORAL | 2 refills | Status: DC

## 2022-08-22 NOTE — Assessment & Plan Note (Signed)
Acute Started about 1 month ago Experiencing epigastric pain at night-often last all night Certain foods make it worse than others-richer or fattier foods typically Has had gallstones in the past and had similar symptoms at that time Possible symptomatic cholelithiasis Stat abdominal ultrasound Avoid fat/rich food Start Pepcid 40 mg daily Advised that if pain is severe and does not resolve she should go to the emergency room

## 2022-08-22 NOTE — Patient Instructions (Addendum)
       Medications changes include :   Pepcid 40 mg daily     An Ultrasound of your abdomen was ordered      Return if symptoms worsen or fail to improve.

## 2022-08-22 NOTE — Progress Notes (Signed)
Subjective:    Patient ID: [name] [name], female    DOB: 03/05/1955, 67 y.o.   MRN: [id number]      HPI [name] [name] is here for  Chief Complaint  Patient presents with   Abdominal Pain    Patient feels abdominal pain underneath breast plate If any scripts are sent in please print so she can take to local pharmacy closest to her since she has now moved.    Pain all night long.  Started about one month ago.    Worse with certain foods - cake, spaghetti, fatty foods  Usually by the morning the pain is gone.  She rarely has the pain during the day.  Has had some increased burping, occasional GERD.  H/o GB stone and had similar symptoms years ago.  No symptoms since then.  Medications and allergies reviewed with patient and updated if appropriate.  Current Outpatient Medications on File Prior to Visit  Medication Sig Dispense Refill   diclofenac (VOLTAREN) 75 MG EC tablet Take 1 tablet (75 mg total) by mouth 2 (two) times daily as needed. 60 tablet 3   icosapent Ethyl (VASCEPA) 1 g capsule Take 2 capsules (2 g total) by mouth 2 (two) times daily. 360 capsule 3   levothyroxine (SYNTHROID) 112 MCG tablet Take 1 tablet (112 mcg total) by mouth daily. 90 tablet 3   losartan (COZAAR) 100 MG tablet TAKE 1 TABLET BY MOUTH DAILY 90 tablet 0   minoxidil (LONITEN) 2.5 MG tablet Take 2.5 mg by mouth daily.     Multiple Vitamins-Minerals (HAIR/SKIN/NAILS/BIOTIN PO) Take 1 tablet by mouth daily.     Polyvinyl Alcohol-Povidone PF (REFRESH) 1.4-0.6 % SOLN Place 1 drop into both eyes 3 (three) times daily as needed (dry eyes).     rosuvastatin (CRESTOR) 20 MG tablet TAKE 1 TABLET BY MOUTH DAILY 90 tablet 2   sertraline (ZOLOFT) 100 MG tablet TAKE 1 TABLET BY MOUTH DAILY 90 tablet 2   finasteride (PROSCAR) 5 MG tablet Take 2.5 mg by mouth daily. (Patient not taking: Reported on 08/22/2022)     No current facility-administered medications on file prior to visit.    Review of Systems   Constitutional:  Negative for fever.  Respiratory:  Negative for shortness of breath.   Cardiovascular:  Negative for chest pain and palpitations.  Gastrointestinal:  Positive for abdominal pain and constipation. Negative for diarrhea and nausea.       No gerd, little burping       Objective:   Vitals:   08/22/22 1556  BP: 126/80  Pulse: 82  Temp: 98.1 F (36.7 C)  SpO2: 98%   BP Readings from Last 3 Encounters:  08/22/22 126/80  07/17/22 120/68  05/29/22 (!) 142/78   Wt Readings from Last 3 Encounters:  08/22/22 229 lb (103.9 kg)  07/17/22 230 lb (104.3 kg)  05/29/22 232 lb (105.2 kg)   Body mass index is 36.96 kg/m.    Physical Exam Constitutional:      General: She is not in acute distress.    Appearance: Normal appearance. She is well-developed. She is not ill-appearing.  HENT:     Head: Normocephalic and atraumatic.  Cardiovascular:     Rate and Rhythm: Normal rate and regular rhythm.  Pulmonary:     Effort: Pulmonary effort is normal.     Breath sounds: Normal breath sounds.  Abdominal:     General: There is no distension.     Palpations: Abdomen is soft. There  is no mass.     Tenderness: There is abdominal tenderness (Mild in right upper quadrant and epigastric region). There is no guarding or rebound.     Hernia: No hernia is present.  Musculoskeletal:     Right lower leg: No edema.     Left lower leg: No edema.  Skin:    General: Skin is warm and dry.  Neurological:     Mental Status: She is alert.            Assessment & Plan:    See Problem List for Assessment and Plan of chronic medical problems.

## 2022-08-22 NOTE — Assessment & Plan Note (Signed)
new Experiencing some symptoms consistent with GERD Start Pepcid 40 mg daily GERD diet Will reevaluate after ultrasound of abdomen

## 2022-08-24 ENCOUNTER — Ambulatory Visit
Admission: RE | Admit: 2022-08-24 | Discharge: 2022-08-24 | Disposition: A | Discharge status: Home / Self Care | Payer: Medicare Other | Source: Ambulatory Visit | Attending: Internal Medicine | Admitting: Internal Medicine

## 2022-08-24 DIAGNOSIS — K8020 Calculus of gallbladder without cholecystitis without obstruction: Secondary | ICD-10-CM | POA: Diagnosis not present

## 2022-08-24 DIAGNOSIS — R1013 Epigastric pain: Secondary | ICD-10-CM | POA: Diagnosis not present

## 2022-08-25 ENCOUNTER — Telehealth: Payer: Self-pay | Admitting: Internal Medicine

## 2022-08-25 DIAGNOSIS — K8020 Calculus of gallbladder without cholecystitis without obstruction: Secondary | ICD-10-CM

## 2022-08-25 NOTE — Telephone Encounter (Signed)
Referral ordered.  Message sent to patient stating so.

## 2022-08-25 NOTE — Telephone Encounter (Signed)
Patient called and said she saw the message Dr. Lawerance Bach sent about her ultrasound results. She said she is okay with getting a referral to a surgeon and would like for Dr. Lawerance Bach to put that referral in for her. Best callback is [phone].

## 2022-09-01 ENCOUNTER — Telehealth: Payer: Self-pay | Admitting: Internal Medicine

## 2022-09-01 DIAGNOSIS — K8020 Calculus of gallbladder without cholecystitis without obstruction: Secondary | ICD-10-CM

## 2022-09-01 NOTE — Telephone Encounter (Signed)
Patient has a referral for [facility] already put in. She said she is having trouble getting in contact with them and on the occasions she can, she's sent to voicemail or told they'll give her a call back. She would like to know if someone can help her find out what to do because she's concerned. Best callback is [phone].

## 2022-09-01 NOTE — Telephone Encounter (Signed)
We may be able to try someplace in [place] since that is where she is living now-new referral ordered for [facility] in [place]

## 2022-09-05 DIAGNOSIS — G4733 Obstructive sleep apnea (adult) (pediatric): Secondary | ICD-10-CM | POA: Diagnosis not present

## 2022-09-05 NOTE — Telephone Encounter (Signed)
Referral re-faxed again today by [facility].

## 2022-09-06 ENCOUNTER — Ambulatory Visit: Payer: Self-pay | Admitting: Surgery

## 2022-09-06 DIAGNOSIS — G4733 Obstructive sleep apnea (adult) (pediatric): Secondary | ICD-10-CM | POA: Diagnosis not present

## 2022-09-06 DIAGNOSIS — K8010 Calculus of gallbladder with chronic cholecystitis without obstruction: Secondary | ICD-10-CM | POA: Diagnosis not present

## 2022-09-06 DIAGNOSIS — R12 Heartburn: Secondary | ICD-10-CM | POA: Diagnosis not present

## 2022-09-22 ENCOUNTER — Encounter: Payer: Medicare Other | Admitting: Psychology

## 2022-10-04 ENCOUNTER — Encounter: Payer: Self-pay | Admitting: Neurology

## 2022-10-04 ENCOUNTER — Encounter: Payer: Medicare Other | Admitting: Psychology

## 2022-10-04 ENCOUNTER — Ambulatory Visit: Payer: Medicare Other | Admitting: Neurology

## 2022-10-04 VITALS — BP 127/71 | HR 76 | Ht 66.0 in | Wt 235.0 lb

## 2022-10-04 DIAGNOSIS — G4733 Obstructive sleep apnea (adult) (pediatric): Secondary | ICD-10-CM | POA: Diagnosis not present

## 2022-10-04 DIAGNOSIS — Z789 Other specified health status: Secondary | ICD-10-CM | POA: Diagnosis not present

## 2022-10-04 NOTE — Progress Notes (Signed)
Subjective:    Patient ID: [name] [name] is a 67 y.o. female.  HPI    Interim history:   [name] [name] is a 67 year old female with an underlying medical history of hypertension, hyperlipidemia, hypothyroidism, iron deficiency, arthritis, allergies, hepatic steatosis, herniated cervical disc, memory loss (followed by [facility] neurology), cholelithiasis, polycythemia, and obesity, who presents for follow-up consultation of her obstructive sleep apnea after interim testing and starting home AutoPap therapy.  The patient is unaccompanied today.  I first met her at the request of her primary care physician on 05/29/2022, at which time she reported snoring and nocturia.  She was advised to proceed with a sleep study.  She had a home sleep test through our office on 06/21/2022 which showed  moderate obstructive sleep apnea with a total AHI of 23/hour (utilizing the 4% desaturation criteria for hypopneas per Medicare guidelines) and O2 nadir of 80%.  Moderate to loud snoring was detected fairly consistently throughout the study, at times in the mild range.  She was advised to proceed with home AutoPap therapy.  Her set up date was 07/06/2022.  She has a ResMed air sense 10 AutoSet machine.  Her DME company is [employer].  Today, 10/04/2022: I reviewed her AutoPap compliance data from 09/03/2022 through 10/02/2022, which is a total of 30 days, during which time she used her machine every night with percent use days greater than 4 hours at 100%, indicating superb compliance with an average usage of 8 hours and 10 minutes, residual AHI borderline at 5.5/h, 95th percentile of pressure at 11.5 cm with a range of 6 to 12 cm with EPR of 2.  Leak on the higher side with the 95th percentile at 22.4 L/min.  She reports that she is still adjusting to the machine.  She is not sleeping as well with it compared to before but is motivated to continue with treatment as she is hoping to reap more long-term benefit from  it.  She has an upcoming appointment with her dermatologist and her primary care.  She will repeat some blood work to see if her polycythemia is better.  She is a side sleeper, currently using a medium N20 nasal mask from ResMed, she does notice a leak from time to time.  She does not have much in the way of dry mouth and just recently started using the humidifier in the machine.  She has moved to [place], they are currently staying with their daughter and are building a house in [place]. Both her children live in the [place] area and she is enjoying time with the grandkids.  She still has maintained her medical providers in the [place] area for now.  We talked about her home sleep test results in detail and reviewed her compliance data.  I was able to provide her with a sample nasal mask from [employer] called AirTouch, size large, she may need a medium later on.  She is willing to try it.   The patient's allergies, current medications, family history, past medical history, past social history, past surgical history and problem list were reviewed and updated as appropriate.   Previously:  05/29/2022: (She) reports snoring and nocturia. Her Epworth sleepiness score is 3 out of 24, fatigue severity score is 36 out of 63.  I reviewed your telemedicine note from 05/03/22.  She was recently evaluated at [facility] neurology for her cognitive concerns.  She has been trying to exercise on a regular basis and is working on weight loss, weight  has been more or less stable.  Her son has sleep apnea and uses a CPAP machine.  She has had elevated hemoglobin and hematocrit values for the past several years. Her bedtime is around 10 PM but she likes to watch some TV in bed, it is on a sleep timer, does not tend to stay on all night.  She usually falls asleep around 11:30 PM.  Rise time is around 7:30 AM.  She lives with her husband, she has 1 son, 1 daughter, she has 3 granddaughters and 1 grandson.  They have a cat in the  household.  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  She is a restless sleeper and endorses some symptoms of restless leg syndrome.  She limits her caffeine to 1 or 2 cups of coffee in the morning.  She is a non-smoker.  She drinks alcohol rarely, maybe twice a year.  She has nocturia about twice per average night, denies with morning headaches or nocturnal headaches.  Her Past Medical History Is Significant For: Past Medical History:  Diagnosis Date   Allergic rhinoconjunctivitis, seasonal and perennial 07/02/2012   Arthritis    Bartholin's gland abscess 08/08/2021   left side, 5cm, abundant pus expressed, word catheter placed, f/u 1 week left side, 5cm, abundant pus expressed, word catheter placed, f/u 1 week- word catheter removed at pt request   Calculus of gallbladder 09/11/2008   Cholelithiasis    Dysmetabolic syndrome X 09/11/2008   Dysrhythmia    Essential hypertension 09/11/2007   Fatty liver disease, nonalcoholic 09/11/2008   Generalized anxiety disorder 03/28/2015   Tremors on 100 mg sertraline - improved with 50 mg dose weaning off benzo   Heart murmur    Hepatic steatosis    Herniated cervical disc 06/17/2015   Numbness in left arm  Improved with PT   History of basal cell carcinoma 05/09/2006   History of colonic polyps 02/09/2010   Adenomatous   Hyperhidrosis 07/21/2020   Intermittent-had only sweats-?  Related to anxiety   Hyperlipemia    Hyperlipidemia 09/11/2007   Hypothyroidism    Iron deficiency 02/06/2017   Localized osteoarthritis of right knee 01/04/2022   Lumbar radiculopathy 12/26/2018   Menopausal flushing 06/17/2020   worsening, reviewed risks of HRT and other non-hormonal options, pt would like to try Effexor and will contact PCP about option of clonidine. Also discussed black cohosh, soy supplements   OSA (obstructive sleep apnea) 09/06/2012   HST 2014:  AHI 8/hr  No CPAP; Dr Shelle Iron   Palpitations  06/17/2015   History of PVC  Full cardiac work up neg 2016  Propranolol prn   PONV (postoperative nausea and vomiting)    Type II diabetes mellitus 06/17/2015   Vitamin D deficiency 02/06/2017    Her Past Surgical History Is Significant For: Past Surgical History:  Procedure Laterality Date   ABDOMINAL HYSTERECTOMY     @ age 3, due to fibroids    COLONOSCOPY W/ POLYPECTOMY  01/24/2008   Dr.Jacobs, due 2015    DILATION AND CURETTAGE OF UTERUS     JOINT REPLACEMENT     left knee   MOHS SURGERY  01/23/2002   Basal Cell    TONSILLECTOMY     TOTAL KNEE ARTHROPLASTY  09/22/2011   Dr Tamala Bari, Left;   TRANSFORAMINAL LUMBAR INTERBODY FUSION W/ MIS 1 LEVEL N/A 02/02/2021   Procedure: Lumbar three-four Minimally Invasive Transforaminal Lumbar Interbody Fusion;  Surgeon: Jadene Pierini, MD;  Location:  [facility] OR;  Service: Neurosurgery;  Laterality: N/A;    Her Family History Is Significant For: Family History  Problem Relation Age of Onset   Diabetes Mother    Leukemia Mother    High blood pressure Mother    Alcohol abuse Father    Lung cancer Father        smoker   Diabetes Brother    High blood pressure Maternal Uncle    Heart attack Maternal Uncle        X3 ; MIs in 30s & 40s   Stroke Maternal Uncle        in his 87s   High blood pressure Other        multiple uncles on maternal side   Sleep apnea Son    Adrenal disorder Neg Hx    Colon cancer Neg Hx    Colon polyps Neg Hx    Esophageal cancer Neg Hx    Rectal cancer Neg Hx    Stomach cancer Neg Hx     Her Social History Is Significant For: Social History   Socioeconomic History   Marital status: Married    Spouse name: Charles   Number of children: 2   Years of education: 14   Highest education level: Tax adviser degree: occupational, Scientist, product/process development, or vocational program  Occupational History   Occupation: Retired  Tobacco Use   Smoking status: Never   Smokeless tobacco: Never  Vaping Use   Vaping status: Never  Used  Substance and Sexual Activity   Alcohol use: Not Currently    Comment: rare, once or twice a year at the most   Drug use: No   Sexual activity: Yes  Other Topics Concern   Not on file  Social History Narrative   Exercises: gym 2-3 times per week for 1-2 hours   Right handed   Caffeine: 1-2 cups coffee/day in the mornings    Two story home    Social Determinants of Health   Financial Resource Strain: Low Risk  (07/16/2022)   Overall Financial Resource Strain (CARDIA)    Difficulty of Paying Living Expenses: Not hard at all  Food Insecurity: No Food Insecurity (07/16/2022)   Hunger Vital Sign    Worried About Running Out of Food in the Last Year: Never true    Ran Out of Food in the Last Year: Never true  Transportation Needs: No Transportation Needs (07/16/2022)   PRAPARE - Administrator, Civil Service (Medical): No    Lack of Transportation (Non-Medical): No  Physical Activity: Insufficiently Active (07/16/2022)   Exercise Vital Sign    Days of Exercise per Week: 2 days    Minutes of Exercise per Session: 60 min  Stress: No Stress Concern Present (07/16/2022)   [employer] of Occupational Health - Occupational Stress Questionnaire    Feeling of Stress : Only a little  Social Connections: Moderately Isolated (07/16/2022)   Social Connection and Isolation Panel [NHANES]    Frequency of Communication with Friends and Family: More than three times a week    Frequency of Social Gatherings with Friends and Family: Once a week    Attends Religious Services: Never    Database administrator or Organizations: No    Attends Banker Meetings: Never    Marital Status: Married    Her Allergies Are:  Allergies  Allergen Reactions   Ampicillin Rash    rash   Doxycycline Other (See Comments)    Joint symptoms  w/o rash or fever   Effexor [Venlafaxine] Other (See Comments)    Constipation, elevated BP   Oxycodone Nausea Only   Penicillins Rash  :    Her Current Medications Are:  Outpatient Encounter Medications as of 10/04/2022  Medication Sig   diclofenac (VOLTAREN) 75 MG EC tablet Take 1 tablet (75 mg total) by mouth 2 (two) times daily as needed.   famotidine (PEPCID) 40 MG tablet Take 1 tablet (40 mg total) by mouth daily.   levothyroxine (SYNTHROID) 112 MCG tablet Take 1 tablet (112 mcg total) by mouth daily.   losartan (COZAAR) 100 MG tablet TAKE 1 TABLET BY MOUTH DAILY   minoxidil (LONITEN) 2.5 MG tablet Take 2.5 mg by mouth daily.   Multiple Vitamins-Minerals (HAIR/SKIN/NAILS/BIOTIN PO) Take 1 tablet by mouth daily.   Polyvinyl Alcohol-Povidone PF (REFRESH) 1.4-0.6 % SOLN Place 1 drop into both eyes 3 (three) times daily as needed (dry eyes).   rosuvastatin (CRESTOR) 20 MG tablet TAKE 1 TABLET BY MOUTH DAILY   sertraline (ZOLOFT) 100 MG tablet TAKE 1 TABLET BY MOUTH DAILY   No facility-administered encounter medications on file as of 10/04/2022.  :  Review of Systems:  Out of a complete 14 point review of systems, all are reviewed and negative with the exception of these symptoms as listed below:  Review of Systems  Neurological:        Pt here for CPAP f/u Pt states getting less sleep on CPAP machine Pt states wakes up 4-5 times a night    ESS:    Objective:  Neurological Exam  Physical Exam Physical Examination:   Vitals:   10/04/22 1002  BP: 127/71  Pulse: 76    General Examination: The patient is a very pleasant 67 y.o. female in no acute distress. She appears well-developed and well-nourished and well groomed.   HEENT: Normocephalic, atraumatic, pupils are equal, round and reactive to light, extraocular tracking is good without limitation to gaze excursion or nystagmus noted. Hearing is grossly intact. Face is symmetric with normal facial animation. Speech is clear with no dysarthria noted. There is no hypophonia. There is no lip, neck/head, jaw or voice tremor. Neck is supple with full range of passive  and active motion. There are no carotid bruits on auscultation. Oropharynx exam reveals: mild mouth dryness, adequate dental hygiene and moderate airway crowding.  Tongue protrudes centrally and palate elevates symmetrically.     Chest: Clear to auscultation without wheezing, rhonchi or crackles noted.   Heart: S1+S2+0, regular and normal without murmurs, rubs or gallops noted.    Abdomen: Soft, non-tender and non-distended.   Extremities: There is no obvious edema in the distal lower extremities bilaterally.    Skin: Warm and dry without trophic changes noted.    Musculoskeletal: exam reveals no obvious joint deformities.    Neurologically:  Mental status: The patient is awake, alert and oriented in all 4 spheres. Her immediate and remote memory, attention, language skills and fund of knowledge are appropriate. There is no evidence of aphasia, agnosia, apraxia or anomia. Speech is clear with normal prosody and enunciation. Thought process is linear. Mood is normal and affect is normal.  Cranial nerves II - XII are as described above under HEENT exam.  Motor exam: Normal bulk, strength and tone is noted. There is no obvious action or resting tremor.  Fine motor skills and coordination: grossly intact.  Cerebellar testing: No dysmetria or intention tremor. There is no truncal or gait ataxia.  Sensory exam:  intact to light touch in the upper and lower extremities.  Gait, station and balance: She stands easily. No veering to one side is noted. No leaning to one side is noted. Posture is age-appropriate and stance is narrow based. Gait shows normal stride length and normal pace. No problems turning are noted.    Assessment and Plan:  In summary, [name] [name] is a 67 year old female with an underlying medical history of hypertension, hyperlipidemia, hypothyroidism, iron deficiency, arthritis, allergies, hepatic steatosis, herniated cervical disc, memory loss (followed by [facility]  neurology), cholelithiasis, polycythemia, and obesity, who presents for follow-up consultation of her obstructive sleep apnea after interim testing and starting home AutoPap therapy. She had a home sleep test through our office on 06/21/2022 which showed  moderate obstructive sleep apnea with a total AHI of 23/hour (utilizing the 4% desaturation criteria for hypopneas per Medicare guidelines) and O2 nadir of 80%.  Moderate to loud snoring was detected fairly consistently throughout the study, at times in the mild range.  She has been on home AutoPap therapy since 07/06/2022.  She has a ResMed air sense 10 AutoSet machine.  Her DME company is [employer]. she is adjusting to treatment still, she has had some tolerance issues.  She is working through these.  She is very motivated to continue with treatment, she is highly commended for treatment adherence.  Her AHI is borderline but may improve if the leak from the mask comes down.  She was given a sample mask today to try, if she likes it she can reorder it through her DME provider.  She and her husband have essentially moved to the [place] area.  Eventually, she may establish care with local providers but for now we can offer her a follow-up in 1 year, we can do a MyChart video visit, she is encouraged to schedule this with one of our nurse practitioners.  We talked about her sleep test results in detail as well as her compliance data.  I answered all her questions today and she was in agreement with our plan. I spent 40 minutes in total face-to-face time and in reviewing records during pre-charting, more than 50% of which was spent in counseling and coordination of care, reviewing test results, reviewing medications and treatment regimen and/or in discussing or reviewing the diagnosis of OSA, the prognosis and treatment options. Pertinent laboratory and imaging test results that were available during this visit with the patient were reviewed by me and  considered in my medical decision making (see chart for details).

## 2022-10-04 NOTE — Patient Instructions (Signed)
It was nice to see you again today. I am glad to hear, things are going fairly well with your autoPAP therapy.  Remember, you are still adjusting to treatment as it has not even been 3 months yet.  You have fulfilled the insurance-mandated compliance percentage, which is reassuring, so you can get ongoing supplies through your insurance. Please talk to your DME provider about getting replacement supplies on a regular basis. Please be sure to change your filter every month, your mask about every 3 months, hose about every 6 months, humidifier chamber about yearly. Some restrictions are imposed by your insurance carrier with regard to how frequently you can get certain supplies.  Your DME company can provide further details if necessary.   Please continue using your autoPAP regularly. While your insurance requires that you use PAP at least 4 hours each night on 70% of the nights, I recommend, that you not skip any nights and use it throughout the night if you can. Getting used to PAP and staying with the treatment long term does take time and patience and discipline. Untreated obstructive sleep apnea when it is moderate to severe can have an adverse impact on cardiovascular health and raise her risk for heart disease, arrhythmias, hypertension, congestive heart failure, stroke and diabetes. Untreated obstructive sleep apnea causes sleep disruption, nonrestorative sleep, and sleep deprivation. This can have an impact on your day to day functioning and cause daytime sleepiness and impairment of cognitive function, memory loss, mood disturbance, and problems focussing. Using PAP regularly can improve these symptoms.  We can see you in 1 year, you can see one of our nurse practitioners as you are stable.  We can offer you a video visit as you are in [place].  Eventually, once you establish with a sleep specialist locally, we can transition your care to the [place] area.  Try the sample mask I provided today  which is a large N20 AirTouch from [employer].  If you like this style, and need a medium, please reorder from your DME provider.

## 2022-10-11 ENCOUNTER — Encounter: Payer: Medicare Other | Admitting: Psychology

## 2022-10-11 DIAGNOSIS — L814 Other melanin hyperpigmentation: Secondary | ICD-10-CM | POA: Diagnosis not present

## 2022-10-11 DIAGNOSIS — D485 Neoplasm of uncertain behavior of skin: Secondary | ICD-10-CM | POA: Diagnosis not present

## 2022-10-11 DIAGNOSIS — Z85828 Personal history of other malignant neoplasm of skin: Secondary | ICD-10-CM | POA: Diagnosis not present

## 2022-10-11 DIAGNOSIS — D225 Melanocytic nevi of trunk: Secondary | ICD-10-CM | POA: Diagnosis not present

## 2022-10-11 DIAGNOSIS — L910 Hypertrophic scar: Secondary | ICD-10-CM | POA: Diagnosis not present

## 2022-10-11 DIAGNOSIS — L578 Other skin changes due to chronic exposure to nonionizing radiation: Secondary | ICD-10-CM | POA: Diagnosis not present

## 2022-10-11 DIAGNOSIS — L570 Actinic keratosis: Secondary | ICD-10-CM | POA: Diagnosis not present

## 2022-10-13 ENCOUNTER — Other Ambulatory Visit: Payer: Self-pay | Admitting: Surgery

## 2022-10-13 DIAGNOSIS — K8010 Calculus of gallbladder with chronic cholecystitis without obstruction: Secondary | ICD-10-CM | POA: Diagnosis not present

## 2022-10-13 DIAGNOSIS — K811 Chronic cholecystitis: Secondary | ICD-10-CM | POA: Diagnosis not present

## 2022-10-13 DIAGNOSIS — K760 Fatty (change of) liver, not elsewhere classified: Secondary | ICD-10-CM | POA: Diagnosis not present

## 2022-10-13 DIAGNOSIS — K7581 Nonalcoholic steatohepatitis (NASH): Secondary | ICD-10-CM | POA: Diagnosis not present

## 2022-10-18 LAB — SURGICAL PATHOLOGY

## 2022-10-31 ENCOUNTER — Other Ambulatory Visit: Payer: Self-pay | Admitting: Internal Medicine

## 2022-11-07 ENCOUNTER — Ambulatory Visit (INDEPENDENT_AMBULATORY_CARE_PROVIDER_SITE_OTHER): Payer: Medicare Other | Admitting: Radiology

## 2022-11-07 DIAGNOSIS — Z23 Encounter for immunization: Secondary | ICD-10-CM | POA: Diagnosis not present

## 2022-11-07 NOTE — Progress Notes (Signed)
Patient I is here for HD flu shot. Patient tolerated well with no complications.

## 2022-11-16 ENCOUNTER — Other Ambulatory Visit: Payer: Self-pay | Admitting: Internal Medicine

## 2022-11-20 ENCOUNTER — Telehealth: Payer: Self-pay | Admitting: Internal Medicine

## 2022-11-20 ENCOUNTER — Other Ambulatory Visit: Payer: Self-pay

## 2022-11-20 MED ORDER — LOSARTAN POTASSIUM 100 MG PO TABS: ORAL_TABLET | ORAL | 1 refills | Status: DC

## 2022-11-20 NOTE — Telephone Encounter (Signed)
Prescription Request  11/20/2022  LOV: 08/22/2022  What is the name of the medication or equipment? losartan (COZAAR) 100 MG tablet  Have you contacted your pharmacy to request a refill? Yes   Which pharmacy would you like this sent to?  [facility] - [place], [place] - [address] [place] [address] Phone: [phone] Fax: [phone]    Patient notified that their request is being sent to the clinical staff for review and that they should receive a response within 2 business days.   Please advise at Mobile [phone] (mobile)   Patient states she is all out of medication.

## 2022-11-20 NOTE — Telephone Encounter (Signed)
Sent in today 

## 2022-12-18 NOTE — Progress Notes (Unsigned)
Virtual Visit via Video Note  I connected with [name] [name] on 12/18/22 at 10:00 AM EST by a video enabled telemedicine application and verified that I am speaking with the correct person using two identifiers.   I discussed the limitations of evaluation and management by telemedicine and the availability of in person appointments. The patient expressed understanding and agreed to proceed.  Present for the visit:  Myself, Dr Cheryll Cockayne, Hartley Barefoot.  The patient is currently at home and I am in the office.    No referring provider.    History of Present Illness: She is here for an acute visit for cold symptoms.   Her symptoms started > 1 week  She is experiencing fatigue, nasal congestion, sinus pain, cough that is slightly productive, body aches and headaches.  She denies any fevers, shortness of breath.  This is similar to previous sinus infections.  She is not seeing any improvement with over-the-counter cold medications.  She has tried taking  coridicin   Review of Systems  Constitutional:  Positive for malaise/fatigue. Negative for chills and fever.  HENT:  Positive for congestion and sinus pain. Negative for ear pain and sore throat (scratchy).   Respiratory:  Positive for cough and sputum production. Negative for shortness of breath and wheezing.   Gastrointestinal:  Negative for diarrhea and nausea.  Musculoskeletal:  Positive for myalgias (achy all over).  Neurological:  Positive for headaches. Negative for dizziness.      Social History   Socioeconomic History   Marital status: Married    Spouse name: Leonette Most   Number of children: 2   Years of education: 14   Highest education level: Associate degree: occupational, Scientist, product/process development, or vocational program  Occupational History   Occupation: Retired  Tobacco Use   Smoking status: Never   Smokeless tobacco: Never  Vaping Use   Vaping status: Never Used  Substance and Sexual Activity   Alcohol use: Not  Currently    Comment: rare, once or twice a year at the most   Drug use: No   Sexual activity: Yes  Other Topics Concern   Not on file  Social History Narrative   Exercises: gym 2-3 times per week for 1-2 hours   Right handed   Caffeine: 1-2 cups coffee/day in the mornings    Two story home    Social Determinants of Health   Financial Resource Strain: Low Risk  (07/16/2022)   Overall Financial Resource Strain (CARDIA)    Difficulty of Paying Living Expenses: Not hard at all  Food Insecurity: No Food Insecurity (07/16/2022)   Hunger Vital Sign    Worried About Running Out of Food in the Last Year: Never true    Ran Out of Food in the Last Year: Never true  Transportation Needs: No Transportation Needs (07/16/2022)   PRAPARE - Administrator, Civil Service (Medical): No    Lack of Transportation (Non-Medical): No  Physical Activity: Insufficiently Active (07/16/2022)   Exercise Vital Sign    Days of Exercise per Week: 2 days    Minutes of Exercise per Session: 60 min  Stress: No Stress Concern Present (07/16/2022)   [employer] of Occupational Health - Occupational Stress Questionnaire    Feeling of Stress : Only a little  Social Connections: Moderately Isolated (07/16/2022)   Social Connection and Isolation Panel [NHANES]    Frequency of Communication with Friends and Family: More than three times a week    Frequency of Social  Gatherings with Friends and Family: Once a week    Attends Religious Services: Never    Database administrator or Organizations: No    Attends Engineer, structural: Never    Marital Status: Married     Observations/Objective: Appears well in NAD Significant cough throughout visit  Assessment and Plan:  See Problem List for Assessment and Plan of chronic medical problems.   Follow Up Instructions:    I discussed the assessment and treatment plan with the patient. The patient was provided an opportunity to ask questions and  all were answered. The patient agreed with the plan and demonstrated an understanding of the instructions.   The patient was advised to call back or seek an in-person evaluation if the symptoms worsen or if the condition fails to improve as anticipated.    Pincus Sanes, MD

## 2022-12-19 ENCOUNTER — Encounter: Payer: Self-pay | Admitting: Internal Medicine

## 2022-12-19 ENCOUNTER — Telehealth (INDEPENDENT_AMBULATORY_CARE_PROVIDER_SITE_OTHER): Payer: Medicare Other | Admitting: Internal Medicine

## 2022-12-19 DIAGNOSIS — J0100 Acute maxillary sinusitis, unspecified: Secondary | ICD-10-CM | POA: Diagnosis not present

## 2022-12-19 MED ORDER — BENZONATATE 200 MG PO CAPS: 200.0000 mg | ORAL_CAPSULE | Freq: Three times a day (TID) | ORAL | 0 refills | Status: DC | PRN

## 2022-12-19 MED ORDER — HYDROCODONE BIT-HOMATROP MBR 5-1.5 MG/5ML PO SOLN: 5.0000 mL | Freq: Four times a day (QID) | ORAL | 0 refills | Status: DC | PRN

## 2022-12-19 MED ORDER — AZITHROMYCIN 250 MG PO TABS: ORAL_TABLET | ORAL | 0 refills | Status: DC

## 2022-12-19 NOTE — Assessment & Plan Note (Addendum)
Acute Likely bacterial  Start zpak Tessalon Perles, Hycodan cough syrup otc cold medications Rest, fluid Call if no improvement

## 2022-12-27 ENCOUNTER — Other Ambulatory Visit: Payer: Self-pay | Admitting: Internal Medicine

## 2023-01-15 ENCOUNTER — Encounter: Payer: Self-pay | Admitting: Internal Medicine

## 2023-01-15 NOTE — Telephone Encounter (Signed)
 Care team updated and letter sent for eye exam notes.

## 2023-01-25 DIAGNOSIS — M5412 Radiculopathy, cervical region: Secondary | ICD-10-CM | POA: Diagnosis not present

## 2023-01-25 DIAGNOSIS — M1711 Unilateral primary osteoarthritis, right knee: Secondary | ICD-10-CM | POA: Diagnosis not present

## 2023-01-26 ENCOUNTER — Encounter: Payer: Self-pay | Admitting: Neurology

## 2023-01-26 DIAGNOSIS — G4733 Obstructive sleep apnea (adult) (pediatric): Secondary | ICD-10-CM

## 2023-01-29 ENCOUNTER — Other Ambulatory Visit: Payer: Self-pay | Admitting: Internal Medicine

## 2023-01-29 NOTE — Telephone Encounter (Signed)
 A message has been sent to [employer] and [name] [name] will be getting in touch with pt. Regarding her cpap supplies.

## 2023-02-06 NOTE — Telephone Encounter (Signed)
 New, Maryella Shivers, Otilio Jefferson, RN; Macon Large; Alain Honey; Kathe Becton; 1 other Received, thank you!

## 2023-02-06 NOTE — Addendum Note (Signed)
 Addended by: Bertram Savin on: 02/06/2023 10:17 AM   Modules accepted: Orders

## 2023-02-06 NOTE — Telephone Encounter (Signed)
 Order for supplies sent to Dr Frances Furbish to co-sign.

## 2023-02-06 NOTE — Telephone Encounter (Signed)
 High priority message sent to [employer] with order.

## 2023-02-06 NOTE — Telephone Encounter (Signed)
PAP supply order signed.  

## 2023-02-06 NOTE — Telephone Encounter (Signed)
 Patient has a physical scheduled for February 2025

## 2023-02-13 DIAGNOSIS — R29898 Other symptoms and signs involving the musculoskeletal system: Secondary | ICD-10-CM | POA: Diagnosis not present

## 2023-02-13 DIAGNOSIS — M6281 Muscle weakness (generalized): Secondary | ICD-10-CM | POA: Insufficient documentation

## 2023-02-13 DIAGNOSIS — M5412 Radiculopathy, cervical region: Secondary | ICD-10-CM | POA: Diagnosis not present

## 2023-02-14 NOTE — Telephone Encounter (Signed)
[facility] sent [place] a fax asking for copy of office note and cpap order. I faxed these to [facility]. Received a receipt of confirmation. Fax # [phone]

## 2023-02-15 DIAGNOSIS — M5412 Radiculopathy, cervical region: Secondary | ICD-10-CM | POA: Diagnosis not present

## 2023-02-15 DIAGNOSIS — R29898 Other symptoms and signs involving the musculoskeletal system: Secondary | ICD-10-CM | POA: Diagnosis not present

## 2023-02-19 DIAGNOSIS — M5412 Radiculopathy, cervical region: Secondary | ICD-10-CM | POA: Diagnosis not present

## 2023-02-19 DIAGNOSIS — R29898 Other symptoms and signs involving the musculoskeletal system: Secondary | ICD-10-CM | POA: Diagnosis not present

## 2023-02-27 ENCOUNTER — Encounter: Payer: Self-pay | Admitting: Internal Medicine

## 2023-02-27 NOTE — Progress Notes (Signed)
 Subjective:    Patient ID: [name] [name], female    DOB: 10/18/1955, 68 y.o.   MRN: [id number]      HPI [name] [name] is here for a Physical exam and her chronic medical problems.   Just getting back to the gym.  Between moving to her new house and family issues she was not going to the gym regularly.  Has not been great about her eating recently - traveling for family issues.    Medications and allergies reviewed with patient and updated if appropriate.  Current Outpatient Medications on File Prior to Visit  Medication Sig Dispense Refill   diclofenac  (VOLTAREN ) 75 MG EC tablet Take 1 tablet (75 mg total) by mouth 2 (two) times daily as needed. 60 tablet 3   famotidine  (PEPCID ) 40 MG tablet Take 1 tablet (40 mg total) by mouth daily. 30 tablet 2   levothyroxine  (SYNTHROID ) 112 MCG tablet TAKE 1 TABLET BY MOUTH DAILY 100 tablet 2   losartan  (COZAAR ) 100 MG tablet TAKE 1 TABLET BY MOUTH DAILY 100 tablet 2   Multiple Vitamins-Minerals (HAIR/SKIN/NAILS/BIOTIN  PO) Take 1 tablet by mouth daily.     Polyvinyl Alcohol -Povidone PF (REFRESH) 1.4-0.6 % SOLN Place 1 drop into both eyes 3 (three) times daily as needed (dry eyes).     rosuvastatin  (CRESTOR ) 20 MG tablet TAKE 1 TABLET BY MOUTH DAILY 90 tablet 3   No current facility-administered medications on file prior to visit.    Review of Systems  Constitutional:  Negative for fever.  HENT:  Positive for rhinorrhea.   Eyes:  Negative for visual disturbance.  Respiratory:  Negative for cough, shortness of breath and wheezing.   Cardiovascular:  Negative for chest pain, palpitations and leg swelling.  Gastrointestinal:  Positive for constipation (metamucil). Negative for abdominal pain, blood in stool and diarrhea.       No gerd  Genitourinary:  Negative for dysuria.  Musculoskeletal:  Positive for arthralgias (right knee) and back pain (chronic).  Skin:  Negative for rash.  Neurological:  Negative for light-headedness and  headaches.  Psychiatric/Behavioral:  Negative for dysphoric mood. The patient is nervous/anxious.        Objective:   Vitals:   02/28/23 0838  BP: 112/78  Pulse: 60  Temp: 98.1 F (36.7 C)  SpO2: 97%   Filed Weights   02/28/23 0838  Weight: 229 lb (103.9 kg)   Body mass index is 36.96 kg/m.  BP Readings from Last 3 Encounters:  02/28/23 112/78  10/04/22 127/71  08/22/22 126/80    Wt Readings from Last 3 Encounters:  02/28/23 229 lb (103.9 kg)  10/04/22 235 lb (106.6 kg)  08/22/22 229 lb (103.9 kg)       Physical Exam Constitutional: She appears well-developed and well-nourished. No distress.  HENT:  Head: Normocephalic and atraumatic.  Right Ear: External ear normal. Normal ear canal and TM Left Ear: External ear normal.  Normal ear canal and TM Mouth/Throat: Oropharynx is clear and moist.  Eyes: Conjunctivae normal.  Neck: Neck supple. No tracheal deviation present. No thyromegaly present.  right carotid bruit  Cardiovascular: Normal rate, regular rhythm and normal heart sounds.   2/6 sys murmur heard.  No edema. Pulmonary/Chest: Effort normal and breath sounds normal. No respiratory distress. She has no wheezes. She has no rales.  Breast: deferred   Abdominal: Soft. She exhibits no distension. There is no tenderness.  Lymphadenopathy: She has no cervical adenopathy.  Skin: Skin is warm and dry. She is  not diaphoretic.  Psychiatric: She has a normal mood and affect. Her behavior is normal.     Lab Results  Component Value Date   WBC 5.3 07/17/2022   HGB 15.1 (H) 07/17/2022   HCT 46.4 (H) 07/17/2022   PLT 315.0 07/17/2022   GLUCOSE 114 (H) 07/17/2022   CHOL 154 07/17/2022   TRIG 277.0 (H) 07/17/2022   HDL 33.90 (L) 07/17/2022   LDLDIRECT 83.0 07/17/2022   LDLCALC 88 09/30/2020   ALT 20 07/17/2022   AST 22 07/17/2022   NA 140 07/17/2022   K 4.6 07/17/2022   CL 102 07/17/2022   CREATININE 0.90 07/17/2022   BUN 16 07/17/2022   CO2 30 07/17/2022    TSH 1.38 07/17/2022   INR 1.00 09/19/2011   HGBA1C 6.2 07/17/2022   MICROALBUR 4.1 (H) 07/17/2022         Assessment & Plan:   Physical exam: Screening blood work  ordered Exercise  getting back to the gym Weight  obese - will work on weight loss Substance abuse  none   Reviewed recommended immunizations.   Health Maintenance  Topic Date Due   OPHTHALMOLOGY EXAM  Never done   MAMMOGRAM  05/29/2022   Medicare Annual Wellness (AWV)  01/05/2023   HEMOGLOBIN A1C  01/16/2023   COVID-19 Vaccine (5 - 2024-25 season) 03/16/2023 (Originally 09/24/2022)   Zoster Vaccines- Shingrix (1 of 2) 05/28/2023 (Originally 05/02/2005)   Diabetic kidney evaluation - eGFR measurement  07/17/2023   Diabetic kidney evaluation - Urine ACR  07/17/2023   FOOT EXAM  01/04/2024   DTaP/Tdap/Td (3 - Td or Tdap) 03/14/2025   DEXA SCAN  09/21/2025   Colonoscopy  08/01/2028   Pneumonia Vaccine 66+ Years old  Completed   INFLUENZA VACCINE  Completed   Hepatitis C Screening  Completed   HPV VACCINES  Aged Out     Up-to-date with eye exams-had it done recently and had new glasses-no vision concerns  Not up-to-date with mammogram or gyn visits-will establish with a gynecologist where she lives get a mammogram there   See Problem List for Assessment and Plan of chronic medical problems.

## 2023-02-27 NOTE — Patient Instructions (Addendum)
 Blood work was ordered.       Medications changes include :   None    An ultrasound of your carotid arterites was ordered and someone will call you to schedule an appointment.     Return in about 6 months (around 08/28/2023) for follow up.   Health Maintenance, Female Adopting a healthy lifestyle and getting preventive care are important in promoting health and wellness. Ask your health care provider about: The right schedule for you to have regular tests and exams. Things you can do on your own to prevent diseases and keep yourself healthy. What should I know about diet, weight, and exercise? Eat a healthy diet  Eat a diet that includes plenty of vegetables, fruits, low-fat dairy products, and lean protein. Do not eat a lot of foods that are high in solid fats, added sugars, or sodium. Maintain a healthy weight Body mass index (BMI) is used to identify weight problems. It estimates body fat based on height and weight. Your health care provider can help determine your BMI and help you achieve or maintain a healthy weight. Get regular exercise Get regular exercise. This is one of the most important things you can do for your health. Most adults should: Exercise for at least 150 minutes each week. The exercise should increase your heart rate and make you sweat (moderate-intensity exercise). Do strengthening exercises at least twice a week. This is in addition to the moderate-intensity exercise. Spend less time sitting. Even light physical activity can be beneficial. Watch cholesterol and blood lipids Have your blood tested for lipids and cholesterol at 68 years of age, then have this test every 5 years. Have your cholesterol levels checked more often if: Your lipid or cholesterol levels are high. You are older than 68 years of age. You are at high risk for heart disease. What should I know about cancer screening? Depending on your health history and family history, you may  need to have cancer screening at various ages. This may include screening for: Breast cancer. Cervical cancer. Colorectal cancer. Skin cancer. Lung cancer. What should I know about heart disease, diabetes, and high blood pressure? Blood pressure and heart disease High blood pressure causes heart disease and increases the risk of stroke. This is more likely to develop in people who have high blood pressure readings or are overweight. Have your blood pressure checked: Every 3-5 years if you are 88-45 years of age. Every year if you are 55 years old or older. Diabetes Have regular diabetes screenings. This checks your fasting blood sugar level. Have the screening done: Once every three years after age 69 if you are at a normal weight and have a low risk for diabetes. More often and at a younger age if you are overweight or have a high risk for diabetes. What should I know about preventing infection? Hepatitis B If you have a higher risk for hepatitis B, you should be screened for this virus. Talk with your health care provider to find out if you are at risk for hepatitis B infection. Hepatitis C Testing is recommended for: Everyone born from [birth year] through 1965. Anyone with known risk factors for hepatitis C. Sexually transmitted infections (STIs) Get screened for STIs, including gonorrhea and chlamydia, if: You are sexually active and are younger than 68 years of age. You are older than 68 years of age and your health care provider tells you that you are at risk for this type of infection.  Your sexual activity has changed since you were last screened, and you are at increased risk for chlamydia or gonorrhea. Ask your health care provider if you are at risk. Ask your health care provider about whether you are at high risk for HIV. Your health care provider may recommend a prescription medicine to help prevent HIV infection. If you choose to take medicine to prevent HIV, you should first get  tested for HIV. You should then be tested every 3 months for as long as you are taking the medicine. Pregnancy If you are about to stop having your period (premenopausal) and you may become pregnant, seek counseling before you get pregnant. Take 400 to 800 micrograms (mcg) of folic acid every day if you become pregnant. Ask for birth control (contraception) if you want to prevent pregnancy. Osteoporosis and menopause Osteoporosis is a disease in which the bones lose minerals and strength with aging. This can result in bone fractures. If you are 47 years old or older, or if you are at risk for osteoporosis and fractures, ask your health care provider if you should: Be screened for bone loss. Take a calcium  or vitamin D  supplement to lower your risk of fractures. Be given hormone replacement therapy (HRT) to treat symptoms of menopause. Follow these instructions at home: Alcohol  use Do not drink alcohol  if: Your health care provider tells you not to drink. You are pregnant, may be pregnant, or are planning to become pregnant. If you drink alcohol : Limit how much you have to: 0-1 drink a day. Know how much alcohol  is in your drink. In the [place] one drink equals one 12 oz bottle of beer (355 mL), one 5 oz glass of wine (148 mL), or one 1 oz glass of hard liquor (44 mL). Lifestyle Do not use any products that contain nicotine or tobacco. These products include cigarettes, chewing tobacco, and vaping devices, such as e-cigarettes. If you need help quitting, ask your health care provider. Do not use street drugs. Do not share needles. Ask your health care provider for help if you need support or information about quitting drugs. General instructions Schedule regular health, dental, and eye exams. Stay current with your vaccines. Tell your health care provider if: You often feel depressed. You have ever been abused or do not feel safe at home. Summary Adopting a healthy lifestyle and getting  preventive care are important in promoting health and wellness. Follow your health care provider's instructions about healthy diet, exercising, and getting tested or screened for diseases. Follow your health care provider's instructions on monitoring your cholesterol and blood pressure. This information is not intended to replace advice given to you by your health care provider. Make sure you discuss any questions you have with your health care provider. Document Revised: 05/31/2020 Document Reviewed: 05/31/2020 Elsevier Patient Education  2024 [employer].

## 2023-02-28 ENCOUNTER — Ambulatory Visit: Payer: Medicare Other | Admitting: Internal Medicine

## 2023-02-28 VITALS — BP 112/78 | HR 60 | Temp 98.1°F | Ht 66.0 in | Wt 229.0 lb

## 2023-02-28 DIAGNOSIS — E7849 Other hyperlipidemia: Secondary | ICD-10-CM

## 2023-02-28 DIAGNOSIS — E1169 Type 2 diabetes mellitus with other specified complication: Secondary | ICD-10-CM

## 2023-02-28 DIAGNOSIS — K219 Gastro-esophageal reflux disease without esophagitis: Secondary | ICD-10-CM | POA: Diagnosis not present

## 2023-02-28 DIAGNOSIS — D751 Secondary polycythemia: Secondary | ICD-10-CM | POA: Diagnosis not present

## 2023-02-28 DIAGNOSIS — R0989 Other specified symptoms and signs involving the circulatory and respiratory systems: Secondary | ICD-10-CM

## 2023-02-28 DIAGNOSIS — Z0000 Encounter for general adult medical examination without abnormal findings: Secondary | ICD-10-CM

## 2023-02-28 DIAGNOSIS — E559 Vitamin D deficiency, unspecified: Secondary | ICD-10-CM

## 2023-02-28 DIAGNOSIS — L570 Actinic keratosis: Secondary | ICD-10-CM | POA: Diagnosis not present

## 2023-02-28 DIAGNOSIS — E039 Hypothyroidism, unspecified: Secondary | ICD-10-CM

## 2023-02-28 DIAGNOSIS — I10 Essential (primary) hypertension: Secondary | ICD-10-CM | POA: Diagnosis not present

## 2023-02-28 DIAGNOSIS — D485 Neoplasm of uncertain behavior of skin: Secondary | ICD-10-CM | POA: Diagnosis not present

## 2023-02-28 DIAGNOSIS — F411 Generalized anxiety disorder: Secondary | ICD-10-CM

## 2023-02-28 DIAGNOSIS — G4733 Obstructive sleep apnea (adult) (pediatric): Secondary | ICD-10-CM | POA: Diagnosis not present

## 2023-02-28 DIAGNOSIS — L0102 Bockhart's impetigo: Secondary | ICD-10-CM | POA: Diagnosis not present

## 2023-02-28 LAB — CBC WITH DIFFERENTIAL/PLATELET
Basophils Absolute: 0.1 10*3/uL (ref 0.0–0.1)
Basophils Relative: 0.9 % (ref 0.0–3.0)
Eosinophils Absolute: 0.2 10*3/uL (ref 0.0–0.7)
Eosinophils Relative: 3.9 % (ref 0.0–5.0)
HCT: 44.7 % (ref 36.0–46.0)
Hemoglobin: 14.6 g/dL (ref 12.0–15.0)
Lymphocytes Relative: 26.8 % (ref 12.0–46.0)
Lymphs Abs: 1.5 10*3/uL (ref 0.7–4.0)
MCHC: 32.7 g/dL (ref 30.0–36.0)
MCV: 90.9 fL (ref 78.0–100.0)
Monocytes Absolute: 0.5 10*3/uL (ref 0.1–1.0)
Monocytes Relative: 8.6 % (ref 3.0–12.0)
Neutro Abs: 3.4 10*3/uL (ref 1.4–7.7)
Neutrophils Relative %: 59.8 % (ref 43.0–77.0)
Platelets: 316 10*3/uL (ref 150.0–400.0)
RBC: 4.91 Mil/uL (ref 3.87–5.11)
RDW: 13.7 % (ref 11.5–15.5)
WBC: 5.8 10*3/uL (ref 4.0–10.5)

## 2023-02-28 LAB — COMPREHENSIVE METABOLIC PANEL
ALT: 15 U/L (ref 0–35)
AST: 20 U/L (ref 0–37)
Albumin: 4.2 g/dL (ref 3.5–5.2)
Alkaline Phosphatase: 52 U/L (ref 39–117)
BUN: 13 mg/dL (ref 6–23)
CO2: 26 meq/L (ref 19–32)
Calcium: 9 mg/dL (ref 8.4–10.5)
Chloride: 105 meq/L (ref 96–112)
Creatinine, Ser: 0.83 mg/dL (ref 0.40–1.20)
GFR: 72.74 mL/min (ref >60.00)
Glucose, Bld: 107 mg/dL — ABNORMAL HIGH (ref 70–99)
Potassium: 4.2 meq/L (ref 3.5–5.1)
Sodium: 140 meq/L (ref 135–145)
Total Bilirubin: 0.5 mg/dL (ref 0.2–1.2)
Total Protein: 7.1 g/dL (ref 6.0–8.3)

## 2023-02-28 LAB — LIPID PANEL
Cholesterol: 139 mg/dL (ref 0–200)
HDL: 39.8 mg/dL (ref >39.00)
LDL Cholesterol: 57 mg/dL (ref 0–99)
NonHDL: 99.45
Total CHOL/HDL Ratio: 3
Triglycerides: 210 mg/dL — ABNORMAL HIGH (ref 0.0–149.0)
VLDL: 42 mg/dL — ABNORMAL HIGH (ref 0.0–40.0)

## 2023-02-28 LAB — TSH: TSH: 2.53 u[IU]/mL (ref 0.35–5.50)

## 2023-02-28 LAB — HEMOGLOBIN A1C: Hgb A1c MFr Bld: 6.3 % (ref 4.6–6.5)

## 2023-02-28 MED ORDER — SERTRALINE HCL 50 MG PO TABS: 50.0000 mg | ORAL_TABLET | Freq: Every day | ORAL | Status: DC

## 2023-02-28 NOTE — Assessment & Plan Note (Signed)
Chronic Regular exercise and healthy diet encouraged Check lipid panel  Continue Crestor 20 mg daily 

## 2023-02-28 NOTE — Assessment & Plan Note (Signed)
 Chronic Controlled, Stable Continue sertraline 100 mg daily

## 2023-02-28 NOTE — Assessment & Plan Note (Signed)
 Chronic  Clinically euthyroid Check tsh and will titrate med dose if needed Currently taking levothyroxine 112 mcg daily

## 2023-02-28 NOTE — Assessment & Plan Note (Signed)
Chronic Likely related to OSA which is now being treated CBC

## 2023-02-28 NOTE — Assessment & Plan Note (Signed)
 Chronic  Lab Results  Component Value Date   HGBA1C 6.2 07/17/2022   Sugars controlled Check A1c Continue lifestyle control Stressed regular exercise, diabetic diet

## 2023-02-28 NOTE — Assessment & Plan Note (Signed)
Chronic Blood pressure well controlled CMP, CBC Continue losartan 100 mg daily 

## 2023-02-28 NOTE — Assessment & Plan Note (Signed)
 Chronic  controlled Continue Pepcid  40 mg daily prn GERD diet

## 2023-02-28 NOTE — Assessment & Plan Note (Addendum)
 Chronic Using CPAP nightly She does not like the cpap

## 2023-02-28 NOTE — Assessment & Plan Note (Signed)
 Has right carotid bruit ?  Radiation from heart-does have a 2/6 systolic murmur versus stenosis Ultrasound carotid arteries ordered

## 2023-02-28 NOTE — Assessment & Plan Note (Signed)
 Chronic Currently taking multivitamin

## 2023-03-01 ENCOUNTER — Encounter: Payer: Self-pay | Admitting: Internal Medicine

## 2023-03-13 DIAGNOSIS — M5412 Radiculopathy, cervical region: Secondary | ICD-10-CM | POA: Diagnosis not present

## 2023-03-13 DIAGNOSIS — R29898 Other symptoms and signs involving the musculoskeletal system: Secondary | ICD-10-CM | POA: Diagnosis not present

## 2023-03-13 DIAGNOSIS — M6281 Muscle weakness (generalized): Secondary | ICD-10-CM | POA: Diagnosis not present

## 2023-03-15 ENCOUNTER — Ambulatory Visit: Payer: Medicare Other

## 2023-03-15 VITALS — Ht 66.0 in | Wt 229.0 lb

## 2023-03-15 DIAGNOSIS — Z0000 Encounter for general adult medical examination without abnormal findings: Secondary | ICD-10-CM | POA: Diagnosis not present

## 2023-03-15 NOTE — Patient Instructions (Signed)
 [name] [name] , Thank you for taking time to come for your Medicare Wellness Visit. I appreciate your ongoing commitment to your health goals. Please review the following plan we discussed and let me know if I can assist you in the future.   Referrals/Orders/Follow-Ups/Clinician Recommendations: It was nice talking to you today.  You are due for a mammogram.  Please let us know when you established with a GYN for the mammogram record.  Keep up the good work.  This is a list of the screening recommended for you and due dates:  Health Maintenance  Topic Date Due   Eye exam for diabetics  Never done   Mammogram  05/29/2022   COVID-19 Vaccine (5 - 2024-25 season) 03/16/2023*   Zoster (Shingles) Vaccine (1 of 2) 05/28/2023*   Yearly kidney health urinalysis for diabetes  07/17/2023   Hemoglobin A1C  08/28/2023   Complete foot exam   01/04/2024   Yearly kidney function blood test for diabetes  02/28/2024   Medicare Annual Wellness Visit  03/14/2024   DTaP/Tdap/Td vaccine (3 - Td or Tdap) 03/14/2025   DEXA scan (bone density measurement)  09/21/2025   Colon Cancer Screening  08/01/2028   Pneumonia Vaccine  Completed   Flu Shot  Completed   Hepatitis C Screening  Completed   HPV Vaccine  Aged Out  *Topic was postponed. The date shown is not the original due date.    Advanced directives: (Copy Requested) Please bring a copy of your health care power of attorney and living will to the office to be added to your chart at your convenience.  Next Medicare Annual Wellness Visit scheduled for next year: Yes

## 2023-03-15 NOTE — Progress Notes (Signed)
 Subjective:   [name] [name] is a 68 y.o. female who presents for Medicare Annual (Subsequent) preventive examination.  Visit Complete: Virtual I connected with  Cindee Lame on 03/15/23 by a audio enabled telemedicine application and verified that I am speaking with the correct person using two identifiers.  Patient Location: Home  Provider Location: Office/Clinic  I discussed the limitations of evaluation and management by telemedicine. The patient expressed understanding and agreed to proceed.  Vital Signs: Because this visit was a virtual/telehealth visit, some criteria may be missing or patient reported. Any vitals not documented were not able to be obtained and vitals that have been documented are patient reported.  Cardiac Risk Factors include: diabetes mellitus;advanced age (>32men, >1 women);hypertension;Other (see comment);dyslipidemia, Risk factor comments: OSA,     Objective:    Today's Vitals   03/15/23 1521  Weight: 229 lb (103.9 kg)  Height: 5\' 6"  (1.676 m)   Body mass index is 36.96 kg/m.     03/15/2023    3:32 PM 01/04/2022    8:40 AM 09/01/2021    9:31 AM 07/15/2021   10:00 AM 01/31/2021   10:35 AM 01/04/2020    2:04 AM 09/22/2011    6:00 PM  Advanced Directives  Does Patient Have a Medical Advance Directive? Yes Yes Yes Yes Yes No Patient has advance directive, copy not in chart  Type of Advance Directive Healthcare Power of [place];Living will Healthcare Power of [place];Living will   Healthcare Power of [place];Living will  Healthcare Power of [place];Living will  Does patient want to make changes to medical advance directive?  No - Patient declined       Copy of Healthcare Power of Attorney in Chart? No - copy requested No - copy requested     Copy requested from family  Would patient like information on creating a medical advance directive?      No - Patient declined   Pre-existing out of facility DNR order (yellow form or pink MOST  form)       No    Current Medications (verified) Outpatient Encounter Medications as of 03/15/2023  Medication Sig   diclofenac (VOLTAREN) 75 MG EC tablet Take 1 tablet (75 mg total) by mouth 2 (two) times daily as needed.   famotidine (PEPCID) 40 MG tablet Take 1 tablet (40 mg total) by mouth daily.   levothyroxine (SYNTHROID) 112 MCG tablet TAKE 1 TABLET BY MOUTH DAILY   losartan (COZAAR) 100 MG tablet TAKE 1 TABLET BY MOUTH DAILY   Multiple Vitamins-Minerals (HAIR/SKIN/NAILS/BIOTIN PO) Take 1 tablet by mouth daily.   Polyvinyl Alcohol-Povidone PF (REFRESH) 1.4-0.6 % SOLN Place 1 drop into both eyes 3 (three) times daily as needed (dry eyes).   rosuvastatin (CRESTOR) 20 MG tablet TAKE 1 TABLET BY MOUTH DAILY   sertraline (ZOLOFT) 50 MG tablet Take 1 tablet (50 mg total) by mouth daily.   No facility-administered encounter medications on file as of 03/15/2023.    Allergies (verified) Ampicillin, Doxycycline, Effexor [venlafaxine], Oxycodone, and Penicillins   History: Past Medical History:  Diagnosis Date   Allergic rhinoconjunctivitis, seasonal and perennial 07/02/2012   Arthritis    Bartholin's gland abscess 08/08/2021   left side, 5cm, abundant pus expressed, word catheter placed, f/u 1 week left side, 5cm, abundant pus expressed, word catheter placed, f/u 1 week- word catheter removed at pt request   Calculus of gallbladder 09/11/2008   Cholelithiasis    Dysmetabolic syndrome X 09/11/2008   Dysrhythmia    Essential  hypertension 09/11/2007   Fatty liver disease, nonalcoholic 09/11/2008   Generalized anxiety disorder 03/28/2015   Tremors on 100 mg sertraline - improved with 50 mg dose weaning off benzo   Heart murmur    Hepatic steatosis    Herniated cervical disc 06/17/2015   Numbness in left arm  Improved with PT   History of basal cell carcinoma 05/09/2006   History of colonic polyps 02/09/2010   Adenomatous   Hyperhidrosis 07/21/2020   Intermittent-had only sweats-?   Related to anxiety   Hyperlipemia    Hyperlipidemia 09/11/2007   Hypothyroidism    Iron deficiency 02/06/2017   Localized osteoarthritis of right knee 01/04/2022   Lumbar radiculopathy 12/26/2018   Menopausal flushing 06/17/2020   worsening, reviewed risks of HRT and other non-hormonal options, pt would like to try Effexor and will contact PCP about option of clonidine. Also discussed black cohosh, soy supplements   OSA (obstructive sleep apnea) 09/06/2012   HST 2014:  AHI 8/hr  No CPAP; Dr Shelle Iron   Palpitations 06/17/2015   History of PVC  Full cardiac work up neg 2016  Propranolol prn   PONV (postoperative nausea and vomiting)    Type II diabetes mellitus 06/17/2015   Vitamin D deficiency 02/06/2017   Past Surgical History:  Procedure Laterality Date   ABDOMINAL HYSTERECTOMY     @ age 71, due to fibroids    COLONOSCOPY W/ POLYPECTOMY  01/24/2008   Dr.Jacobs, due 2015    DILATION AND CURETTAGE OF UTERUS     JOINT REPLACEMENT     left knee   MOHS SURGERY  01/23/2002   Basal Cell    TONSILLECTOMY     TOTAL KNEE ARTHROPLASTY  09/22/2011   Dr Tamala Bari, Left;   TRANSFORAMINAL LUMBAR INTERBODY FUSION W/ MIS 1 LEVEL N/A 02/02/2021   Procedure: Lumbar three-four Minimally Invasive Transforaminal Lumbar Interbody Fusion;  Surgeon: Jadene Pierini, MD;  Location: [facility] OR;  Service: Neurosurgery;  Laterality: N/A;   Family History  Problem Relation Age of Onset   Diabetes Mother    Leukemia Mother    High blood pressure Mother    Alcohol abuse Father    Lung cancer Father        smoker   Diabetes Brother    High blood pressure Maternal Uncle    Heart attack Maternal Uncle        X3 ; MIs in 30s & 40s   Stroke Maternal Uncle        in his 79s   High blood pressure Other        multiple uncles on maternal side   Sleep apnea Son    Adrenal disorder Neg Hx    Colon cancer Neg Hx    Colon polyps Neg Hx    Esophageal cancer Neg Hx    Rectal cancer Neg Hx    Stomach cancer Neg  Hx    Social History   Socioeconomic History   Marital status: Married    Spouse name: Charles   Number of children: 2   Years of education: 14   Highest education level: Associate degree: occupational, Scientist, product/process development, or vocational program  Occupational History   Occupation: Retired  Tobacco Use   Smoking status: Never   Smokeless tobacco: Never  Vaping Use   Vaping status: Never Used  Substance and Sexual Activity   Alcohol use: Not Currently    Comment: rare, once or twice a year at the most   Drug use: No  Sexual activity: Yes  Other Topics Concern   Not on file  Social History Narrative   Exercises: gym 2-3 times per week for 1-2 hours   Right handed   Caffeine: 1-2 cups coffee/day in the mornings    Two story home    Lives with husband   Social Drivers of Health   Financial Resource Strain: Low Risk  (03/15/2023)   Overall Financial Resource Strain (CARDIA)    Difficulty of Paying Living Expenses: Not hard at all  Food Insecurity: No Food Insecurity (03/15/2023)   Hunger Vital Sign    Worried About Running Out of Food in the Last Year: Never true    Ran Out of Food in the Last Year: Never true  Transportation Needs: No Transportation Needs (03/15/2023)   PRAPARE - Administrator, Civil Service (Medical): No    Lack of Transportation (Non-Medical): No  Physical Activity: Sufficiently Active (03/15/2023)   Exercise Vital Sign    Days of Exercise per Week: 3 days    Minutes of Exercise per Session: 60 min  Stress: No Stress Concern Present (03/15/2023)   [employer] of Occupational Health - Occupational Stress Questionnaire    Feeling of Stress : Not at all  Social Connections: Moderately Isolated (03/15/2023)   Social Connection and Isolation Panel [NHANES]    Frequency of Communication with Friends and Family: More than three times a week    Frequency of Social Gatherings with Friends and Family: Once a week    Attends Religious Services: Never     Database administrator or Organizations: No    Attends Engineer, structural: Never    Marital Status: Married    Tobacco Counseling Counseling given: Not Answered   Clinical Intake:  Pre-visit preparation completed: Yes  Pain : No/denies pain     BMI - recorded: 36.96 Nutritional Status: BMI > 30  Obese Diabetes: Yes CBG done?: No Did pt. bring in CBG monitor from home?: No  How often do you need to have someone help you when you read instructions, pamphlets, or other written materials from your doctor or pharmacy?: 1 - Never  Interpreter Needed?: No  Information entered by :: Tiernan Millikin, RMA   Activities of Daily Living    03/15/2023    3:24 PM  In your present state of health, do you have any difficulty performing the following activities:  Hearing? 0  Vision? 0  Difficulty concentrating or making decisions? 0  Walking or climbing stairs? 0  Dressing or bathing? 0  Doing errands, shopping? 0  Preparing Food and eating ? N  Using the Toilet? N  In the past six months, have you accidently leaked urine? N  Do you have problems with loss of bowel control? N  Managing your Medications? N  Managing your Finances? N  Housekeeping or managing your Housekeeping? N    Patient Care Team: Pincus Sanes, MD as PCP - General (Internal Medicine) Karie Soda, MD as Consulting Physician (General Surgery) [facility]  Indicate any recent Medical Services you may have received from other than [facility] providers in the past year (date may be approximate).     Assessment:   This is a routine wellness examination for Gilma.  Hearing/Vision screen Hearing Screening - Comments:: Denies hearing difficulties   Vision Screening - Comments:: Wears eyeglasses   Goals Addressed             This Visit's Progress    Patient  Stated   On track    Continue to work with my orthopedic to get my knee feeling better.       Depression Screen    03/15/2023     3:39 PM 07/17/2022    9:59 AM 01/04/2022    8:36 AM 07/21/2020    8:15 AM 10/20/2019    1:56 PM 06/26/2018   11:46 AM 10/26/2016   10:12 AM  PHQ 2/9 Scores  PHQ - 2 Score 0 0 0 0 0 1 0  PHQ- 9 Score 0     3     Fall Risk    03/15/2023    3:33 PM 07/17/2022    9:59 AM 01/04/2022    8:41 AM 09/01/2021    9:31 AM 07/15/2021   10:00 AM  Fall Risk   Falls in the past year? 0 0 0 0 0  Number falls in past yr: 0 0 0 0 0  Injury with Fall? 0 0 0 0 0  Risk for fall due to : No Fall Risks No Fall Risks No Fall Risks    Follow up Falls prevention discussed;Falls evaluation completed Falls evaluation completed Falls evaluation completed      MEDICARE RISK AT HOME: Medicare Risk at Home Any stairs in or around the home?: No Home free of loose throw rugs in walkways, pet beds, electrical cords, etc?: Yes Adequate lighting in your home to reduce risk of falls?: Yes Use of a cane, walker or w/c?: No Grab bars in the bathroom?: No Shower chair or bench in shower?: No Elevated toilet seat or a handicapped toilet?: No  TIMED UP AND GO:  Was the test performed?  No    Cognitive Function:      07/17/2021    7:00 PM  Montreal Cognitive Assessment   Visuospatial/ Executive (0/5) 5  Naming (0/3) 3  Attention: Read list of digits (0/2) 2  Attention: Read list of letters (0/1) 1  Attention: Serial 7 subtraction starting at 100 (0/3) 1  Language: Repeat phrase (0/2) 2  Language : Fluency (0/1) 1  Abstraction (0/2) 1  Delayed Recall (0/5) 3  Orientation (0/6) 5  Total 24  Adjusted Score (based on education) 24      03/15/2023    3:26 PM 01/04/2022    8:41 AM  6CIT Screen  What Year? 0 points 0 points  What month? 0 points 0 points  What time? 0 points 0 points  Count back from 20 0 points 0 points  Months in reverse 0 points 0 points  Repeat phrase 4 points 0 points  Total Score 4 points 0 points    Immunizations Immunization History  Administered Date(s) Administered   Fluad  Quad(high Dose 65+) 10/01/2020, 10/19/2021   Fluad Trivalent(High Dose 65+) 11/07/2022   Influenza Whole 10/24/2011   Influenza,inj,Quad PF,6+ Mos 11/30/2016, 10/16/2017, 10/26/2018, 10/20/2019   Influenza-Unspecified 10/24/2014   Moderna Sars-Covid-2 Vaccination 04/21/2019, 05/19/2019, 10/24/2019, 10/23/2020   PNEUMOCOCCAL CONJUGATE-20 10/01/2020   Td 12/30/2008   Tdap 03/15/2015    TDAP status: Up to date  Flu Vaccine status: Up to date  Pneumococcal vaccine status: Up to date  Covid-19 vaccine status: Declined, Education has been provided regarding the importance of this vaccine but patient still declined. Advised may receive this vaccine at local pharmacy or Health Dept.or vaccine clinic. Aware to provide a copy of the vaccination record if obtained from local pharmacy or Health Dept. Verbalized acceptance and understanding.  Qualifies for Shingles Vaccine? Yes  Zostavax completed No   Shingrix Completed?: No.    Education has been provided regarding the importance of this vaccine. Patient has been advised to call insurance company to determine out of pocket expense if they have not yet received this vaccine. Advised may also receive vaccine at local pharmacy or Health Dept. Verbalized acceptance and understanding.  Screening Tests Health Maintenance  Topic Date Due   OPHTHALMOLOGY EXAM  Never done   MAMMOGRAM  05/29/2022   COVID-19 Vaccine (5 - 2024-25 season) 03/16/2023 (Originally 09/24/2022)   Zoster Vaccines- Shingrix (1 of 2) 05/28/2023 (Originally 05/02/2005)   Diabetic kidney evaluation - Urine ACR  07/17/2023   HEMOGLOBIN A1C  08/28/2023   FOOT EXAM  01/04/2024   Diabetic kidney evaluation - eGFR measurement  02/28/2024   Medicare Annual Wellness (AWV)  03/14/2024   DTaP/Tdap/Td (3 - Td or Tdap) 03/14/2025   DEXA SCAN  09/21/2025   Colonoscopy  08/01/2028   Pneumonia Vaccine 63+ Years old  Completed   INFLUENZA VACCINE  Completed   Hepatitis C Screening  Completed    HPV VACCINES  Aged Out    Health Maintenance  Health Maintenance Due  Topic Date Due   OPHTHALMOLOGY EXAM  Never done   MAMMOGRAM  05/29/2022    Colorectal cancer screening: Type of screening: Colonoscopy. Completed 08/01/2028. Repeat every 7 years   Bone Density status: Completed 09/21/2020. Results reflect: Bone density results: NORMAL. Repeat every 3-5 years.  Lung Cancer Screening: (Low Dose CT Chest recommended if Age 31-80 years, 20 pack-year currently smoking OR have quit w/in 15years.) does not qualify.   Lung Cancer Screening Referral: N/A  Additional Screening:  Hepatitis C Screening: does qualify; Completed 06/17/2015  Vision Screening: Recommended annual ophthalmology exams for early detection of glaucoma and other disorders of the eye. Is the patient up to date with their annual eye exam?  Yes  Who is the provider or what is the name of the office in which the patient attends annual eye exams? [facility] [place], [place]) If pt is not established with a provider, would they like to be referred to a provider to establish care? No .   Dental Screening: Recommended annual dental exams for proper oral hygiene  Diabetic Foot Exam: Diabetic Foot Exam: Completed 01/04/2023  Community Resource Referral / Chronic Care Management: CRR required this visit?  No   CCM required this visit?  No     Plan:     I have personally reviewed and noted the following in the patient's chart:   Medical and social history Use of alcohol, tobacco or illicit drugs  Current medications and supplements including opioid prescriptions. Patient is not currently taking opioid prescriptions. Functional ability and status Nutritional status Physical activity Advanced directives List of other physicians Hospitalizations, surgeries, and ER visits in previous 12 months Vitals Screenings to include cognitive, depression, and falls Referrals and appointments  In addition, I have  reviewed and discussed with patient certain preventive protocols, quality metrics, and best practice recommendations. A written personalized care plan for preventive services as well as general preventive health recommendations were provided to patient.     Raechel Chute Corwyn Vora, CMA   03/15/2023   After Visit Summary: (MyChart) Due to this being a telephonic visit, the after visit summary with patients personalized plan was offered to patient via MyChart   Nurse Notes: Patient is due for a mammogram and stated that she wanted to find a GYN in [place], [place].  She will contact  this office once she is set up with GYN and mammogram.  Patient had a recent eye exam.  I will reach out to eye doctor about records today.

## 2023-03-19 DIAGNOSIS — R29898 Other symptoms and signs involving the musculoskeletal system: Secondary | ICD-10-CM | POA: Diagnosis not present

## 2023-03-19 DIAGNOSIS — M6281 Muscle weakness (generalized): Secondary | ICD-10-CM | POA: Diagnosis not present

## 2023-03-19 DIAGNOSIS — M5412 Radiculopathy, cervical region: Secondary | ICD-10-CM | POA: Diagnosis not present

## 2023-03-26 DIAGNOSIS — R29898 Other symptoms and signs involving the musculoskeletal system: Secondary | ICD-10-CM | POA: Diagnosis not present

## 2023-03-26 DIAGNOSIS — M6281 Muscle weakness (generalized): Secondary | ICD-10-CM | POA: Diagnosis not present

## 2023-03-26 DIAGNOSIS — M5412 Radiculopathy, cervical region: Secondary | ICD-10-CM | POA: Diagnosis not present

## 2023-04-02 DIAGNOSIS — M5412 Radiculopathy, cervical region: Secondary | ICD-10-CM | POA: Diagnosis not present

## 2023-04-02 DIAGNOSIS — R29898 Other symptoms and signs involving the musculoskeletal system: Secondary | ICD-10-CM | POA: Diagnosis not present

## 2023-04-02 DIAGNOSIS — M6281 Muscle weakness (generalized): Secondary | ICD-10-CM | POA: Diagnosis not present

## 2023-04-09 DIAGNOSIS — R29898 Other symptoms and signs involving the musculoskeletal system: Secondary | ICD-10-CM | POA: Diagnosis not present

## 2023-04-09 DIAGNOSIS — M6281 Muscle weakness (generalized): Secondary | ICD-10-CM | POA: Diagnosis not present

## 2023-04-09 DIAGNOSIS — M5412 Radiculopathy, cervical region: Secondary | ICD-10-CM | POA: Diagnosis not present

## 2023-04-11 DIAGNOSIS — L578 Other skin changes due to chronic exposure to nonionizing radiation: Secondary | ICD-10-CM | POA: Diagnosis not present

## 2023-04-11 DIAGNOSIS — L648 Other androgenic alopecia: Secondary | ICD-10-CM | POA: Diagnosis not present

## 2023-04-11 DIAGNOSIS — Z85828 Personal history of other malignant neoplasm of skin: Secondary | ICD-10-CM | POA: Diagnosis not present

## 2023-04-11 DIAGNOSIS — L570 Actinic keratosis: Secondary | ICD-10-CM | POA: Diagnosis not present

## 2023-04-11 DIAGNOSIS — L821 Other seborrheic keratosis: Secondary | ICD-10-CM | POA: Diagnosis not present

## 2023-04-11 DIAGNOSIS — D225 Melanocytic nevi of trunk: Secondary | ICD-10-CM | POA: Diagnosis not present

## 2023-04-11 DIAGNOSIS — L814 Other melanin hyperpigmentation: Secondary | ICD-10-CM | POA: Diagnosis not present

## 2023-04-11 DIAGNOSIS — D369 Benign neoplasm, unspecified site: Secondary | ICD-10-CM | POA: Diagnosis not present

## 2023-04-18 DIAGNOSIS — L74511 Primary focal hyperhidrosis, face: Secondary | ICD-10-CM | POA: Diagnosis not present

## 2023-04-18 DIAGNOSIS — L74519 Primary focal hyperhidrosis, unspecified: Secondary | ICD-10-CM | POA: Diagnosis not present

## 2023-04-18 DIAGNOSIS — L74510 Primary focal hyperhidrosis, axilla: Secondary | ICD-10-CM | POA: Diagnosis not present

## 2023-04-18 DIAGNOSIS — L658 Other specified nonscarring hair loss: Secondary | ICD-10-CM | POA: Diagnosis not present

## 2023-04-23 DIAGNOSIS — R051 Acute cough: Secondary | ICD-10-CM | POA: Diagnosis not present

## 2023-04-23 DIAGNOSIS — J302 Other seasonal allergic rhinitis: Secondary | ICD-10-CM | POA: Diagnosis not present

## 2023-04-23 DIAGNOSIS — J4520 Mild intermittent asthma, uncomplicated: Secondary | ICD-10-CM | POA: Diagnosis not present

## 2023-04-23 DIAGNOSIS — J0100 Acute maxillary sinusitis, unspecified: Secondary | ICD-10-CM | POA: Diagnosis not present

## 2023-04-24 DIAGNOSIS — R29898 Other symptoms and signs involving the musculoskeletal system: Secondary | ICD-10-CM | POA: Diagnosis not present

## 2023-04-24 DIAGNOSIS — M6281 Muscle weakness (generalized): Secondary | ICD-10-CM | POA: Diagnosis not present

## 2023-04-24 DIAGNOSIS — M5412 Radiculopathy, cervical region: Secondary | ICD-10-CM | POA: Diagnosis not present

## 2023-04-30 DIAGNOSIS — M6281 Muscle weakness (generalized): Secondary | ICD-10-CM | POA: Diagnosis not present

## 2023-04-30 DIAGNOSIS — M5412 Radiculopathy, cervical region: Secondary | ICD-10-CM | POA: Diagnosis not present

## 2023-04-30 DIAGNOSIS — R29898 Other symptoms and signs involving the musculoskeletal system: Secondary | ICD-10-CM | POA: Diagnosis not present

## 2023-05-08 DIAGNOSIS — R29898 Other symptoms and signs involving the musculoskeletal system: Secondary | ICD-10-CM | POA: Diagnosis not present

## 2023-05-08 DIAGNOSIS — M5412 Radiculopathy, cervical region: Secondary | ICD-10-CM | POA: Diagnosis not present

## 2023-05-08 DIAGNOSIS — M6281 Muscle weakness (generalized): Secondary | ICD-10-CM | POA: Diagnosis not present

## 2023-05-30 DIAGNOSIS — L74510 Primary focal hyperhidrosis, axilla: Secondary | ICD-10-CM | POA: Diagnosis not present

## 2023-05-30 DIAGNOSIS — L74519 Primary focal hyperhidrosis, unspecified: Secondary | ICD-10-CM | POA: Diagnosis not present

## 2023-05-30 DIAGNOSIS — L74511 Primary focal hyperhidrosis, face: Secondary | ICD-10-CM | POA: Diagnosis not present

## 2023-05-30 DIAGNOSIS — L658 Other specified nonscarring hair loss: Secondary | ICD-10-CM | POA: Diagnosis not present

## 2023-06-11 DIAGNOSIS — M47817 Spondylosis without myelopathy or radiculopathy, lumbosacral region: Secondary | ICD-10-CM | POA: Diagnosis not present

## 2023-06-11 DIAGNOSIS — M47816 Spondylosis without myelopathy or radiculopathy, lumbar region: Secondary | ICD-10-CM | POA: Diagnosis not present

## 2023-06-11 DIAGNOSIS — M4316 Spondylolisthesis, lumbar region: Secondary | ICD-10-CM | POA: Diagnosis not present

## 2023-06-25 DIAGNOSIS — M4316 Spondylolisthesis, lumbar region: Secondary | ICD-10-CM | POA: Diagnosis not present

## 2023-06-27 DIAGNOSIS — M1711 Unilateral primary osteoarthritis, right knee: Secondary | ICD-10-CM | POA: Diagnosis not present

## 2023-06-27 DIAGNOSIS — M25562 Pain in left knee: Secondary | ICD-10-CM | POA: Diagnosis not present

## 2023-06-27 DIAGNOSIS — Z96652 Presence of left artificial knee joint: Secondary | ICD-10-CM | POA: Insufficient documentation

## 2023-07-03 DIAGNOSIS — M4316 Spondylolisthesis, lumbar region: Secondary | ICD-10-CM | POA: Diagnosis not present

## 2023-07-20 ENCOUNTER — Other Ambulatory Visit: Payer: Self-pay | Admitting: Internal Medicine

## 2023-07-20 DIAGNOSIS — M4316 Spondylolisthesis, lumbar region: Secondary | ICD-10-CM | POA: Diagnosis not present

## 2023-07-24 DIAGNOSIS — M4316 Spondylolisthesis, lumbar region: Secondary | ICD-10-CM | POA: Diagnosis not present

## 2023-08-03 DIAGNOSIS — M4316 Spondylolisthesis, lumbar region: Secondary | ICD-10-CM | POA: Diagnosis not present

## 2023-08-06 DIAGNOSIS — M4316 Spondylolisthesis, lumbar region: Secondary | ICD-10-CM | POA: Diagnosis not present

## 2023-08-09 DIAGNOSIS — M4316 Spondylolisthesis, lumbar region: Secondary | ICD-10-CM | POA: Diagnosis not present

## 2023-08-12 ENCOUNTER — Other Ambulatory Visit: Payer: Self-pay | Admitting: Internal Medicine

## 2023-08-20 DIAGNOSIS — M4316 Spondylolisthesis, lumbar region: Secondary | ICD-10-CM | POA: Diagnosis not present

## 2023-08-20 DIAGNOSIS — M48061 Spinal stenosis, lumbar region without neurogenic claudication: Secondary | ICD-10-CM | POA: Diagnosis not present

## 2023-08-20 DIAGNOSIS — M47817 Spondylosis without myelopathy or radiculopathy, lumbosacral region: Secondary | ICD-10-CM | POA: Diagnosis not present

## 2023-08-20 DIAGNOSIS — M47816 Spondylosis without myelopathy or radiculopathy, lumbar region: Secondary | ICD-10-CM | POA: Diagnosis not present

## 2023-08-20 DIAGNOSIS — M5126 Other intervertebral disc displacement, lumbar region: Secondary | ICD-10-CM | POA: Diagnosis not present

## 2023-08-24 DIAGNOSIS — M4316 Spondylolisthesis, lumbar region: Secondary | ICD-10-CM | POA: Diagnosis not present

## 2023-08-27 DIAGNOSIS — M47816 Spondylosis without myelopathy or radiculopathy, lumbar region: Secondary | ICD-10-CM | POA: Diagnosis not present

## 2023-09-05 DIAGNOSIS — M47816 Spondylosis without myelopathy or radiculopathy, lumbar region: Secondary | ICD-10-CM | POA: Diagnosis not present

## 2023-09-24 ENCOUNTER — Other Ambulatory Visit: Payer: Self-pay | Admitting: Internal Medicine

## 2023-09-26 ENCOUNTER — Encounter: Payer: Self-pay | Admitting: Internal Medicine

## 2023-09-26 NOTE — Progress Notes (Unsigned)
    Subjective:    Patient ID: [name] [name], female    DOB: 21-Mar-1955, 68 y.o.   MRN: [id number]      HPI Jodene is here for No chief complaint on file.        Medications and allergies reviewed with patient and updated if appropriate.  Current Outpatient Medications on File Prior to Visit  Medication Sig Dispense Refill   diclofenac  (VOLTAREN ) 75 MG EC tablet Take 1 tablet (75 mg total) by mouth 2 (two) times daily as needed. 60 tablet 3   famotidine  (PEPCID ) 40 MG tablet Take 1 tablet (40 mg total) by mouth daily. 30 tablet 2   levothyroxine  (SYNTHROID ) 112 MCG tablet TAKE 1 TABLET BY MOUTH DAILY 100 tablet 2   losartan  (COZAAR ) 100 MG tablet TAKE 1 TABLET BY MOUTH DAILY 100 tablet 2   Multiple Vitamins-Minerals (HAIR/SKIN/NAILS/BIOTIN  PO) Take 1 tablet by mouth daily.     Polyvinyl Alcohol -Povidone PF (REFRESH) 1.4-0.6 % SOLN Place 1 drop into both eyes 3 (three) times daily as needed (dry eyes).     rosuvastatin  (CRESTOR ) 20 MG tablet TAKE 1 TABLET BY MOUTH DAILY 100 tablet 2   sertraline  (ZOLOFT ) 50 MG tablet Take 1 tablet (50 mg total) by mouth daily.     No current facility-administered medications on file prior to visit.    Review of Systems     Objective:  There were no vitals filed for this visit. BP Readings from Last 3 Encounters:  02/28/23 112/78  10/04/22 127/71  08/22/22 126/80   Wt Readings from Last 3 Encounters:  03/15/23 229 lb (103.9 kg)  02/28/23 229 lb (103.9 kg)  10/04/22 235 lb (106.6 kg)   There is no height or weight on file to calculate BMI.    Physical Exam         Assessment & Plan:    See Problem List for Assessment and Plan of chronic medical problems.

## 2023-09-27 ENCOUNTER — Encounter: Payer: Self-pay | Admitting: Internal Medicine

## 2023-09-27 ENCOUNTER — Ambulatory Visit (INDEPENDENT_AMBULATORY_CARE_PROVIDER_SITE_OTHER): Admitting: Internal Medicine

## 2023-09-27 ENCOUNTER — Ambulatory Visit: Payer: Self-pay | Admitting: Internal Medicine

## 2023-09-27 ENCOUNTER — Other Ambulatory Visit: Payer: Self-pay | Admitting: Internal Medicine

## 2023-09-27 ENCOUNTER — Ambulatory Visit
Admission: RE | Admit: 2023-09-27 | Discharge: 2023-09-27 | Disposition: A | Discharge status: Home / Self Care | Source: Ambulatory Visit | Attending: Internal Medicine

## 2023-09-27 VITALS — BP 128/80 | HR 81 | Temp 98.2°F | Ht 66.0 in | Wt 223.2 lb

## 2023-09-27 DIAGNOSIS — R350 Frequency of micturition: Secondary | ICD-10-CM | POA: Diagnosis not present

## 2023-09-27 DIAGNOSIS — R1084 Generalized abdominal pain: Secondary | ICD-10-CM | POA: Diagnosis not present

## 2023-09-27 DIAGNOSIS — R109 Unspecified abdominal pain: Secondary | ICD-10-CM | POA: Diagnosis not present

## 2023-09-27 DIAGNOSIS — N200 Calculus of kidney: Secondary | ICD-10-CM | POA: Diagnosis not present

## 2023-09-27 DIAGNOSIS — K5730 Diverticulosis of large intestine without perforation or abscess without bleeding: Secondary | ICD-10-CM | POA: Insufficient documentation

## 2023-09-27 DIAGNOSIS — Z23 Encounter for immunization: Secondary | ICD-10-CM | POA: Diagnosis not present

## 2023-09-27 DIAGNOSIS — R000 Tachycardia, unspecified: Secondary | ICD-10-CM | POA: Insufficient documentation

## 2023-09-27 DIAGNOSIS — M7989 Other specified soft tissue disorders: Secondary | ICD-10-CM | POA: Insufficient documentation

## 2023-09-27 LAB — COMPREHENSIVE METABOLIC PANEL WITH GFR
ALT: 17 U/L (ref 0–35)
AST: 20 U/L (ref 0–37)
Albumin: 4.4 g/dL (ref 3.5–5.2)
Alkaline Phosphatase: 53 U/L (ref 39–117)
BUN: 15 mg/dL (ref 6–23)
CO2: 30 meq/L (ref 19–32)
Calcium: 9.3 mg/dL (ref 8.4–10.5)
Chloride: 102 meq/L (ref 96–112)
Creatinine, Ser: 0.87 mg/dL (ref 0.40–1.20)
GFR: 68.47 mL/min (ref >60.00)
Glucose, Bld: 112 mg/dL — ABNORMAL HIGH (ref 70–99)
Potassium: 4.3 meq/L (ref 3.5–5.1)
Sodium: 139 meq/L (ref 135–145)
Total Bilirubin: 0.6 mg/dL (ref 0.2–1.2)
Total Protein: 7 g/dL (ref 6.0–8.3)

## 2023-09-27 LAB — CBC WITH DIFFERENTIAL/PLATELET
Basophils Absolute: 0 K/uL (ref 0.0–0.1)
Basophils Relative: 0.8 % (ref 0.0–3.0)
Eosinophils Absolute: 0.1 K/uL (ref 0.0–0.7)
Eosinophils Relative: 2.4 % (ref 0.0–5.0)
HCT: 42.5 % (ref 36.0–46.0)
Hemoglobin: 14.3 g/dL (ref 12.0–15.0)
Lymphocytes Relative: 27.2 % (ref 12.0–46.0)
Lymphs Abs: 1.6 K/uL (ref 0.7–4.0)
MCHC: 33.7 g/dL (ref 30.0–36.0)
MCV: 87.9 fl (ref 78.0–100.0)
Monocytes Absolute: 0.4 K/uL (ref 0.1–1.0)
Monocytes Relative: 6.3 % (ref 3.0–12.0)
Neutro Abs: 3.8 K/uL (ref 1.4–7.7)
Neutrophils Relative %: 63.3 % (ref 43.0–77.0)
Platelets: 340 K/uL (ref 150.0–400.0)
RBC: 4.84 Mil/uL (ref 3.87–5.11)
RDW: 13.4 % (ref 11.5–15.5)
WBC: 6 K/uL (ref 4.0–10.5)

## 2023-09-27 LAB — AMYLASE: Amylase: 41 U/L (ref 27–131)

## 2023-09-27 LAB — MICROALBUMIN / CREATININE URINE RATIO
Creatinine,U: 177.7 mg/dL
Microalb Creat Ratio: 186.9 mg/g — ABNORMAL HIGH (ref 0.0–30.0)
Microalb, Ur: 33.2 mg/dL — ABNORMAL HIGH (ref 0.0–1.9)

## 2023-09-27 LAB — LIPASE: Lipase: 34 U/L (ref 11.0–59.0)

## 2023-09-27 MED ORDER — IOPAMIDOL (ISOVUE-370) INJECTION 76%
80.0000 mL | Freq: Once | INTRAVENOUS | Status: AC | PRN
  Administered 2023-09-27: 80 mL via INTRAVENOUS

## 2023-09-27 MED ORDER — HYDROCODONE-ACETAMINOPHEN 5-325 MG PO TABS: 1.0000 | ORAL_TABLET | ORAL | 0 refills | Status: DC | PRN

## 2023-09-27 MED ORDER — CIPROFLOXACIN HCL 250 MG PO TABS: 250.0000 mg | ORAL_TABLET | Freq: Two times a day (BID) | ORAL | 0 refills | Status: AC

## 2023-09-27 MED ORDER — TAMSULOSIN HCL 0.4 MG PO CAPS: 0.4000 mg | ORAL_CAPSULE | Freq: Every day | ORAL | 0 refills | Status: DC

## 2023-09-27 NOTE — Progress Notes (Signed)
 Patient received her flu vaccine today and there were no questions or concerns

## 2023-09-27 NOTE — Patient Instructions (Signed)
      Blood work /urine tests ordered.       Medications changes include :   None    A ct scan of your abdomen / pelvis was ordered.

## 2023-09-27 NOTE — Telephone Encounter (Signed)
 Ct scan show stone in left kidney.      Pain medication, flomax  ( for prostate but helps pass stone) and an antibiotic to be sent to pharmacy.  She needs to see urology - does she want me to refer her to someone down there - it may be easier.    If pain is severe -- needs to go to ED.     Drinks lots of fluids    Confirm pharmacy please and I will send in

## 2023-09-27 NOTE — Telephone Encounter (Signed)
 Meds sent.  Urgent urology referral ordered

## 2023-09-28 ENCOUNTER — Telehealth: Payer: Self-pay

## 2023-09-28 DIAGNOSIS — N200 Calculus of kidney: Secondary | ICD-10-CM

## 2023-09-28 DIAGNOSIS — Q6211 Congenital occlusion of ureteropelvic junction: Secondary | ICD-10-CM

## 2023-09-28 LAB — URINE CULTURE: Result:: NO GROWTH

## 2023-09-28 NOTE — Telephone Encounter (Signed)
 Referral ordered/changed

## 2023-09-28 NOTE — Telephone Encounter (Signed)
 Copied from CRM [id number]. Topic: Referral - Question >> Sep 28, 2023 11:05 AM Drema MATSU wrote: Reason for CRM: Patient wants to know if referral to Urology can be sent to a place in [place] to a Dr. Maude Gaskins. Please give patient a call to see if it can be done.

## 2023-10-01 ENCOUNTER — Telehealth: Payer: Self-pay | Admitting: Neurology

## 2023-10-01 NOTE — Telephone Encounter (Signed)
 Patient called to change office visit to a MyChart Video Visit with Harlene, NP as suggested by neurologist last office visit.

## 2023-10-02 ENCOUNTER — Telehealth: Payer: Self-pay | Admitting: Radiology

## 2023-10-02 DIAGNOSIS — N200 Calculus of kidney: Secondary | ICD-10-CM

## 2023-10-02 DIAGNOSIS — Q6211 Congenital occlusion of ureteropelvic junction: Secondary | ICD-10-CM

## 2023-10-02 NOTE — Telephone Encounter (Signed)
 Copied from CRM #[id number]. Topic: Referral - Question >> Oct 02, 2023  3:26 PM Frederich PARAS wrote: Reason for CRM: pt needs a new referral to [facility] [place] . Pt needs the referral to go this specialist liliya velet  Callback # [phone]

## 2023-10-03 DIAGNOSIS — M1711 Unilateral primary osteoarthritis, right knee: Secondary | ICD-10-CM | POA: Diagnosis not present

## 2023-10-03 DIAGNOSIS — M47816 Spondylosis without myelopathy or radiculopathy, lumbar region: Secondary | ICD-10-CM | POA: Diagnosis not present

## 2023-10-03 NOTE — Addendum Note (Signed)
 Addended by: GEOFM GLADE PARAS on: 10/03/2023 07:57 AM   Modules accepted: Orders

## 2023-10-03 NOTE — Telephone Encounter (Signed)
 Referral ordered

## 2023-10-04 ENCOUNTER — Encounter: Payer: Self-pay | Admitting: Adult Health

## 2023-10-04 ENCOUNTER — Telehealth: Payer: Medicare Other | Admitting: Adult Health

## 2023-10-04 DIAGNOSIS — G4733 Obstructive sleep apnea (adult) (pediatric): Secondary | ICD-10-CM

## 2023-10-04 NOTE — Progress Notes (Signed)
 [facility] [address] [place]. [place] [address] [phone]       OFFICE FOLLOW UP NOTE  [name] Date of Birth:  1955-12-31 Medical Record Number:  [id number]    Primary neurologist: Dr. Buck Reason for visit: CPAP follow-up    Virtual Visit via Video Note  I connected with [name] [name] on 10/04/23 at 10:45 AM EDT by a video enabled telemedicine application and verified that I am speaking with the correct person using two identifiers.  Location: Patient: at home Provider: in office, [facility]   I discussed the limitations of evaluation and management by telemedicine and the availability of in person appointments. The patient expressed understanding and agreed to proceed.    SUBJECTIVE:   Follow-up visit:  Prior visit: 10/04/2022 with Dr. Buck  Brief HPI:   [name] [name] is a 68 y.o. female who is followed for OSA on CPAP.  Diagnosed in 05/2022 after sleep study showed moderate OSA with total AHI of 23/h and O2 nadir of 80%.  AutoPap therapy set up 06/2022.     Interval history:  Returns today via MyChart video visit for yearly CPAP compliance.  Compliance report as noted below showing excellent usage with borderline residual AHI of 5.6. previously having difficulty tolerating due to skin irritation but has since purchased a cover on [place] which resolved this issue. She doesn't overly enjoy using CPAP but does use consistently due to benefit of her polycythemia.  She did miss a couple days recently due to GI upset.  Denies any daytime fatigue.  Can have occasional difficulty sleeping at night but believes this is more due to use of CPAP.  She is now residing in [place], [place] and wishes to continue with virtual visits.  She continues to follow with [facility].  No further questions or concerns at this time.          ROS:   14 system review of systems performed and negative with exception of those listed in  HPI  PMH:  Past Medical History:  Diagnosis Date   Allergic rhinoconjunctivitis, seasonal and perennial 07/02/2012   Arthritis    Bartholin's gland abscess 08/08/2021   left side, 5cm, abundant pus expressed, word catheter placed, f/u 1 week left side, 5cm, abundant pus expressed, word catheter placed, f/u 1 week- word catheter removed at pt request   Calculus of gallbladder 09/11/2008   Cholelithiasis    Dysmetabolic syndrome X 09/11/2008   Dysrhythmia    Essential hypertension 09/11/2007   Fatty liver disease, nonalcoholic 09/11/2008   Generalized anxiety disorder 03/28/2015   Tremors on 100 mg sertraline  - improved with 50 mg dose weaning off benzo   Heart murmur    Hepatic steatosis    Herniated cervical disc 06/17/2015   Numbness in left arm  Improved with PT   History of basal cell carcinoma 05/09/2006   History of colonic polyps 02/09/2010   Adenomatous   Hyperhidrosis 07/21/2020   Intermittent-had only sweats-?  Related to anxiety   Hyperlipemia    Hyperlipidemia 09/11/2007   Hypothyroidism    Iron deficiency 02/06/2017   Localized osteoarthritis of right knee 01/04/2022   Lumbar radiculopathy 12/26/2018   Menopausal flushing 06/17/2020   worsening, reviewed risks of HRT and other non-hormonal options, pt would like to try Effexor and will contact PCP about option of clonidine. Also discussed black cohosh, soy supplements   OSA (obstructive sleep apnea) 09/06/2012   HST 2014:  AHI 8/hr  No  CPAP; Dr Corrie   Palpitations 06/17/2015   History of PVC  Full cardiac work up neg 2016  Propranolol  prn   PONV (postoperative nausea and vomiting)    Type II diabetes mellitus 06/17/2015   Vitamin D  deficiency 02/06/2017    PSH:  Past Surgical History:  Procedure Laterality Date   ABDOMINAL HYSTERECTOMY     @ age 64, due to fibroids    COLONOSCOPY W/ POLYPECTOMY  01/24/2008   Dr.Jacobs, due 2015    DILATION AND CURETTAGE OF UTERUS     JOINT REPLACEMENT     left knee    MOHS SURGERY  01/23/2002   Basal Cell    TONSILLECTOMY     TOTAL KNEE ARTHROPLASTY  09/22/2011   Dr Britta, Left;   TRANSFORAMINAL LUMBAR INTERBODY FUSION W/ MIS 1 LEVEL N/A 02/02/2021   Procedure: Lumbar three-four Minimally Invasive Transforaminal Lumbar Interbody Fusion;  Surgeon: Cheryle Debby LABOR, MD;  Location: [facility] OR;  Service: Neurosurgery;  Laterality: N/A;    Social History:  Social History   Socioeconomic History   Marital status: Married    Spouse name: [name] [name]   Number of children: 2   Years of education: 14   Highest education level: Associate degree: academic program  Occupational History   Occupation: Retired  Tobacco Use   Smoking status: Never   Smokeless tobacco: Never  Vaping Use   Vaping status: Never Used  Substance and Sexual Activity   Alcohol  use: Not Currently    Comment: rare, once or twice a year at the most   Drug use: No   Sexual activity: Yes  Other Topics Concern   Not on file  Social History Narrative   Exercises: gym 2-3 times per week for 1-2 hours   Right handed   Caffeine: 1-2 cups coffee/day in the mornings    Two story home    Lives with husband   Social Drivers of Health   Financial Resource Strain: Low Risk  (09/26/2023)   Overall Financial Resource Strain (CARDIA)    Difficulty of Paying Living Expenses: Not hard at all  Food Insecurity: No Food Insecurity (09/26/2023)   Hunger Vital Sign    Worried About Running Out of Food in the Last Year: Never true    Ran Out of Food in the Last Year: Never true  Transportation Needs: No Transportation Needs (09/26/2023)   PRAPARE - Administrator, Civil Service (Medical): No    Lack of Transportation (Non-Medical): No  Physical Activity: Insufficiently Active (09/26/2023)   Exercise Vital Sign    Days of Exercise per Week: 2 days    Minutes of Exercise per Session: 30 min  Stress: No Stress Concern Present (09/26/2023)   [employer] of Occupational Health - Occupational  Stress Questionnaire    Feeling of Stress: Not at all  Social Connections: Moderately Integrated (09/26/2023)   Social Connection and Isolation Panel    Frequency of Communication with Friends and Family: More than three times a week    Frequency of Social Gatherings with Friends and Family: Once a week    Attends Religious Services: Never    Database administrator or Organizations: Yes    Attends Banker Meetings: 1 to 4 times per year    Marital Status: Married  Catering manager Violence: Not At Risk (03/15/2023)   Humiliation, Afraid, Rape, and Kick questionnaire    Fear of Current or Ex-Partner: No    Emotionally Abused: No  Physically Abused: No    Sexually Abused: No    Family History:  Family History  Problem Relation Age of Onset   Diabetes Mother    Leukemia Mother    High blood pressure Mother    Alcohol  abuse Father    Lung cancer Father        smoker   Diabetes Brother    High blood pressure Maternal Uncle    Heart attack Maternal Uncle        X3 ; MIs in 30s & 40s   Stroke Maternal Uncle        in his 26s   High blood pressure Other        multiple uncles on maternal side   Sleep apnea Son    Adrenal disorder Neg Hx    Colon cancer Neg Hx    Colon polyps Neg Hx    Esophageal cancer Neg Hx    Rectal cancer Neg Hx    Stomach cancer Neg Hx     Medications:   Current Outpatient Medications on File Prior to Visit  Medication Sig Dispense Refill   ciprofloxacin  (CIPRO ) 250 MG tablet Take 1 tablet (250 mg total) by mouth 2 (two) times daily for 7 days. 14 tablet 0   finasteride  (PROSCAR ) 5 MG tablet Take 1.5 mg by mouth daily.     HYDROcodone -acetaminophen  (NORCO/VICODIN) 5-325 MG tablet Take 1-2 tablets by mouth every 4 (four) hours as needed for moderate pain (pain score 4-6) or severe pain (pain score 7-10). 30 tablet 0   levothyroxine  (SYNTHROID ) 112 MCG tablet TAKE 1 TABLET BY MOUTH DAILY 100 tablet 2   losartan  (COZAAR ) 100 MG tablet TAKE 1  TABLET BY MOUTH DAILY 100 tablet 2   Multiple Vitamins-Minerals (HAIR/SKIN/NAILS/BIOTIN  PO) Take 1 tablet by mouth daily.     Polyvinyl Alcohol -Povidone PF (REFRESH) 1.4-0.6 % SOLN Place 1 drop into both eyes 3 (three) times daily as needed (dry eyes).     rosuvastatin  (CRESTOR ) 20 MG tablet TAKE 1 TABLET BY MOUTH DAILY 100 tablet 2   sertraline  (ZOLOFT ) 50 MG tablet Take 1 tablet (50 mg total) by mouth daily.     tamsulosin  (FLOMAX ) 0.4 MG CAPS capsule Take 1 capsule (0.4 mg total) by mouth daily. 30 capsule 0   No current facility-administered medications on file prior to visit.    Allergies:   Allergies  Allergen Reactions   Ampicillin Rash    rash   Doxycycline Other (See Comments)    Joint symptoms w/o rash or fever   Effexor [Venlafaxine] Other (See Comments)    Constipation, elevated BP   Oxycodone  Nausea Only   Penicillins Rash      OBJECTIVE:  Physical Exam   General: well developed, well nourished, very pleasant middle-age Caucasian female, seated, in no evident distress  Neurologic Exam Mental Status: Awake and fully alert. Oriented to place and time. Recent and remote memory intact. Attention span, concentration and fund of knowledge appropriate. Mood and affect appropriate.         ASSESSMENT/PLAN: [name] [name] is a 68 y.o. year old female    OSA on CPAP :  Compliance report shows satisfactory usage with borderline AHI at 5.6 Pressure in the 95th percentile 11.6 with max pressure 11.9 on current settings of 6-12 therefore will increase max setting to 14. Will repeat download in 2 months.  Discussed continued nightly usage with ensuring greater than 4 hours nightly for optimal benefit and per insurance purposes.  DME [facility], continue to follow with DME company for any needed supplies or CPAP related concerns CPAP set up 06/2022     Follow up in 1 year via MyChart video visit or call earlier if needed   CC:  PCP: Geofm Glade PARAS, MD     I personally spent a total of 25 minutes in the care of the patient today including preparing to see the patient, counseling and educating, placing orders, and documenting clinical information in the EHR. This is our first time meeting and time has been spent reviewing past medical history and relevant medical records.   Harlene Bogaert, AGNP-BC  [facility] [address] [place], [place] [address]  Phone [phone] Fax [phone] Note: This document was prepared with digital dictation and possible smart phrase technology. Any transcriptional errors that result from this process are unintentional.

## 2023-10-10 DIAGNOSIS — N201 Calculus of ureter: Secondary | ICD-10-CM | POA: Diagnosis not present

## 2023-10-10 DIAGNOSIS — N132 Hydronephrosis with renal and ureteral calculous obstruction: Secondary | ICD-10-CM | POA: Diagnosis not present

## 2023-10-15 DIAGNOSIS — M7501 Adhesive capsulitis of right shoulder: Secondary | ICD-10-CM | POA: Diagnosis not present

## 2023-10-15 DIAGNOSIS — M7581 Other shoulder lesions, right shoulder: Secondary | ICD-10-CM | POA: Diagnosis not present

## 2023-10-15 DIAGNOSIS — M25511 Pain in right shoulder: Secondary | ICD-10-CM | POA: Diagnosis not present

## 2023-10-16 DIAGNOSIS — N201 Calculus of ureter: Secondary | ICD-10-CM | POA: Diagnosis not present

## 2023-10-16 DIAGNOSIS — R8271 Bacteriuria: Secondary | ICD-10-CM | POA: Diagnosis not present

## 2023-10-16 DIAGNOSIS — Z79899 Other long term (current) drug therapy: Secondary | ICD-10-CM | POA: Diagnosis not present

## 2023-10-16 DIAGNOSIS — Z87442 Personal history of urinary calculi: Secondary | ICD-10-CM | POA: Diagnosis not present

## 2023-10-16 DIAGNOSIS — R300 Dysuria: Secondary | ICD-10-CM | POA: Diagnosis not present

## 2023-10-16 DIAGNOSIS — Z880 Allergy status to penicillin: Secondary | ICD-10-CM | POA: Diagnosis not present

## 2023-10-16 DIAGNOSIS — I10 Essential (primary) hypertension: Secondary | ICD-10-CM | POA: Diagnosis not present

## 2023-10-16 DIAGNOSIS — E039 Hypothyroidism, unspecified: Secondary | ICD-10-CM | POA: Diagnosis not present

## 2023-10-16 DIAGNOSIS — N3289 Other specified disorders of bladder: Secondary | ICD-10-CM | POA: Diagnosis not present

## 2023-10-16 DIAGNOSIS — B952 Enterococcus as the cause of diseases classified elsewhere: Secondary | ICD-10-CM | POA: Diagnosis not present

## 2023-10-16 DIAGNOSIS — N132 Hydronephrosis with renal and ureteral calculous obstruction: Secondary | ICD-10-CM | POA: Diagnosis not present

## 2023-10-16 DIAGNOSIS — G4733 Obstructive sleep apnea (adult) (pediatric): Secondary | ICD-10-CM | POA: Diagnosis not present

## 2023-10-17 DIAGNOSIS — Z79899 Other long term (current) drug therapy: Secondary | ICD-10-CM | POA: Diagnosis not present

## 2023-10-17 DIAGNOSIS — N3289 Other specified disorders of bladder: Secondary | ICD-10-CM | POA: Diagnosis not present

## 2023-10-17 DIAGNOSIS — I10 Essential (primary) hypertension: Secondary | ICD-10-CM | POA: Diagnosis not present

## 2023-10-17 DIAGNOSIS — E039 Hypothyroidism, unspecified: Secondary | ICD-10-CM | POA: Diagnosis not present

## 2023-10-17 DIAGNOSIS — G4733 Obstructive sleep apnea (adult) (pediatric): Secondary | ICD-10-CM | POA: Diagnosis not present

## 2023-10-17 DIAGNOSIS — Z87442 Personal history of urinary calculi: Secondary | ICD-10-CM | POA: Diagnosis not present

## 2023-10-17 DIAGNOSIS — Z880 Allergy status to penicillin: Secondary | ICD-10-CM | POA: Diagnosis not present

## 2023-10-17 DIAGNOSIS — N132 Hydronephrosis with renal and ureteral calculous obstruction: Secondary | ICD-10-CM | POA: Diagnosis not present

## 2023-10-17 DIAGNOSIS — B952 Enterococcus as the cause of diseases classified elsewhere: Secondary | ICD-10-CM | POA: Diagnosis not present

## 2023-10-17 DIAGNOSIS — R8271 Bacteriuria: Secondary | ICD-10-CM | POA: Diagnosis not present

## 2023-10-17 DIAGNOSIS — R300 Dysuria: Secondary | ICD-10-CM | POA: Diagnosis not present

## 2023-10-17 DIAGNOSIS — N201 Calculus of ureter: Secondary | ICD-10-CM | POA: Diagnosis not present

## 2023-10-18 DIAGNOSIS — N132 Hydronephrosis with renal and ureteral calculous obstruction: Secondary | ICD-10-CM | POA: Diagnosis not present

## 2023-10-18 DIAGNOSIS — N3289 Other specified disorders of bladder: Secondary | ICD-10-CM | POA: Diagnosis not present

## 2023-10-18 DIAGNOSIS — R8271 Bacteriuria: Secondary | ICD-10-CM | POA: Diagnosis not present

## 2023-10-18 DIAGNOSIS — G4733 Obstructive sleep apnea (adult) (pediatric): Secondary | ICD-10-CM | POA: Diagnosis not present

## 2023-10-18 DIAGNOSIS — B952 Enterococcus as the cause of diseases classified elsewhere: Secondary | ICD-10-CM | POA: Diagnosis not present

## 2023-10-18 DIAGNOSIS — E039 Hypothyroidism, unspecified: Secondary | ICD-10-CM | POA: Diagnosis not present

## 2023-10-18 DIAGNOSIS — Z79899 Other long term (current) drug therapy: Secondary | ICD-10-CM | POA: Diagnosis not present

## 2023-10-18 DIAGNOSIS — R300 Dysuria: Secondary | ICD-10-CM | POA: Diagnosis not present

## 2023-10-18 DIAGNOSIS — I10 Essential (primary) hypertension: Secondary | ICD-10-CM | POA: Diagnosis not present

## 2023-10-18 DIAGNOSIS — Z87442 Personal history of urinary calculi: Secondary | ICD-10-CM | POA: Diagnosis not present

## 2023-10-18 DIAGNOSIS — N201 Calculus of ureter: Secondary | ICD-10-CM | POA: Diagnosis not present

## 2023-10-18 DIAGNOSIS — Z880 Allergy status to penicillin: Secondary | ICD-10-CM | POA: Diagnosis not present

## 2023-10-19 DIAGNOSIS — E039 Hypothyroidism, unspecified: Secondary | ICD-10-CM | POA: Diagnosis not present

## 2023-10-19 DIAGNOSIS — N132 Hydronephrosis with renal and ureteral calculous obstruction: Secondary | ICD-10-CM | POA: Diagnosis not present

## 2023-10-19 DIAGNOSIS — B952 Enterococcus as the cause of diseases classified elsewhere: Secondary | ICD-10-CM | POA: Diagnosis not present

## 2023-10-19 DIAGNOSIS — N201 Calculus of ureter: Secondary | ICD-10-CM | POA: Diagnosis not present

## 2023-10-19 DIAGNOSIS — N3289 Other specified disorders of bladder: Secondary | ICD-10-CM | POA: Diagnosis not present

## 2023-10-19 DIAGNOSIS — R8271 Bacteriuria: Secondary | ICD-10-CM | POA: Diagnosis not present

## 2023-10-19 DIAGNOSIS — Z87442 Personal history of urinary calculi: Secondary | ICD-10-CM | POA: Diagnosis not present

## 2023-10-19 DIAGNOSIS — G4733 Obstructive sleep apnea (adult) (pediatric): Secondary | ICD-10-CM | POA: Diagnosis not present

## 2023-10-19 DIAGNOSIS — Z880 Allergy status to penicillin: Secondary | ICD-10-CM | POA: Diagnosis not present

## 2023-10-19 DIAGNOSIS — I10 Essential (primary) hypertension: Secondary | ICD-10-CM | POA: Diagnosis not present

## 2023-10-19 DIAGNOSIS — R300 Dysuria: Secondary | ICD-10-CM | POA: Diagnosis not present

## 2023-10-19 DIAGNOSIS — Z79899 Other long term (current) drug therapy: Secondary | ICD-10-CM | POA: Diagnosis not present

## 2023-10-22 ENCOUNTER — Telehealth: Payer: Self-pay | Admitting: *Deleted

## 2023-10-22 NOTE — Transitions of Care (Post Inpatient/ED Visit) (Signed)
 10/22/2023  Name: [name] [name] MRN: [id number] DOB: 1955/06/13  Today's TOC FU Call Status: Today's TOC FU Call Status:: Successful TOC FU Call Completed TOC FU Call Complete Date: 10/22/23 Patient's Name and Date of Birth confirmed.  Transition Care Management Follow-up Telephone Call Date of Discharge: 10/19/23 Discharge Facility: Other [facility]) Name of Other (Non[facility]) Discharge Facility: [facility] Type of Discharge: Inpatient Admission Primary Inpatient Discharge Diagnosis:: UTI/ (L) kidney stone with cystoscopy- stent placement How have you been since you were released from the hospital?: Better (I am fine now that the stent is in and I am not in pain; back to my normal self, independent.  I will call Dr. Geofm if anything comes up and I have concerns, but they told me I just need to see the urologist as scheduled) Any questions or concerns?: No  Items Reviewed: Did you receive and understand the discharge instructions provided?: Yes (Briefly reviewed with patient who verbalizes good understanding of same - outside hospital) Medications obtained,verified, and reconciled?: No (Confirmed per patient report: obtained/ is taking all newly Rx'd medications as instructed; self-manages medications and denies questions/ concerns around medications today) Medications Not Reviewed Reasons:: Other: (Declined medication review) Any new allergies since your discharge?: No Dietary orders reviewed?: Yes Type of Diet Ordered:: Regular Do you have support at home?: Yes People in Home [RPT]: spouse Name of Support/Comfort Primary Source: Reports independent in self-care activities; resides with supportive spouse- assists as/ if needed/ indicated  Medications Reviewed Today: Medications Reviewed Today     Reviewed by Hinley Brimage M, RN (Registered Nurse) on 10/22/23 at 1052  Med List Status: <None>   Medication Order Taking? Sig Documenting Provider Last Dose Status Informant   finasteride  (PROSCAR ) 5 MG tablet [id number]  Take 1.5 mg by mouth daily. [provider]  Active   HYDROcodone -acetaminophen  (NORCO/VICODIN) 5-325 MG tablet [id number]  Take 1-2 tablets by mouth every 4 (four) hours as needed for moderate pain (pain score 4-6) or severe pain (pain score 7-10). Geofm Glade PARAS, MD  Active   levothyroxine  (SYNTHROID ) 112 MCG tablet [id number]  TAKE 1 TABLET BY MOUTH DAILY Geofm Glade PARAS, MD  Active   losartan  (COZAAR ) 100 MG tablet [id number]  TAKE 1 TABLET BY MOUTH DAILY Burns, Glade PARAS, MD  Active   Multiple Vitamins-Minerals (HAIR/SKIN/NAILS/BIOTIN  PO) [id number]  Take 1 tablet by mouth daily. [provider]  Active Self  Polyvinyl Alcohol -Povidone PF (REFRESH) 1.4-0.6 % SOLN [id number]  Place 1 drop into both eyes 3 (three) times daily as needed (dry eyes). [provider]  Active Self  rosuvastatin  (CRESTOR ) 20 MG tablet [id number]  TAKE 1 TABLET BY MOUTH DAILY Burns, Glade PARAS, MD  Active   sertraline  (ZOLOFT ) 50 MG tablet [id number]  Take 1 tablet (50 mg total) by mouth daily. Geofm Glade PARAS, MD  Active   tamsulosin  (FLOMAX ) 0.4 MG CAPS capsule [id number]  Take 1 capsule (0.4 mg total) by mouth daily. Geofm Glade PARAS, MD  Active            Home Care and Equipment/Supplies: Were Home Health Services Ordered?: No Any new equipment or medical supplies ordered?: No  Functional Questionnaire: Do you need assistance with bathing/showering or dressing?: No Do you need assistance with meal preparation?: No Do you need assistance with eating?: No Do you have difficulty maintaining continence: No Do you need assistance with getting out of bed/getting out of a chair/moving?: No Do you have difficulty managing or taking your medications?: No  Follow up appointments reviewed: PCP Follow-up appointment confirmed?: NA (verified PCP HFU office visit not indicated per discharging hospital provider note post- recent hospitalization- patient declined  offer to assist with scheduling hospital follow up PCP office visit: I don't need to see her for this- just the urologist) Specialist Hospital Follow-up appointment confirmed?: Yes Date of Specialist follow-up appointment?: 11/02/23 Follow-Up Specialty Provider:: Urology provider- at [facility] Do you need transportation to your follow-up appointment?: No Do you understand care options if your condition(s) worsen?: Yes-patient verbalized understanding  SDOH Interventions Today    Flowsheet Row Most Recent Value  SDOH Interventions   Food Insecurity Interventions Intervention Not Indicated  Housing Interventions Intervention Not Indicated  Transportation Interventions Intervention Not Indicated  [drives self as per baseline]  Utilities Interventions Intervention Not Indicated   See TOC assessment tabs for additional assessment/ TOC intervention information  Patient declines need for ongoing/ further care management/ coordination outreach; declines enrollment in 30-day TOC program- declines taking my direct phone number should needs/ concerns arise post-TOC call   Pls call/ message for questions,  [name], RN, BSN, Media planner  Transitions of Care  [facility] [phone]: direct office

## 2023-11-02 DIAGNOSIS — L304 Erythema intertrigo: Secondary | ICD-10-CM | POA: Diagnosis not present

## 2023-11-02 DIAGNOSIS — T191XXA Foreign body in bladder, initial encounter: Secondary | ICD-10-CM | POA: Diagnosis not present

## 2023-11-02 DIAGNOSIS — N200 Calculus of kidney: Secondary | ICD-10-CM | POA: Diagnosis not present

## 2023-11-13 ENCOUNTER — Telehealth: Admitting: Internal Medicine

## 2023-11-13 ENCOUNTER — Encounter: Payer: Self-pay | Admitting: Internal Medicine

## 2023-11-13 DIAGNOSIS — J0100 Acute maxillary sinusitis, unspecified: Secondary | ICD-10-CM

## 2023-11-13 MED ORDER — AZITHROMYCIN 250 MG PO TABS: ORAL_TABLET | ORAL | 0 refills | Status: DC

## 2023-11-13 MED ORDER — HYDROCODONE BIT-HOMATROP MBR 5-1.5 MG/5ML PO SOLN: 5.0000 mL | Freq: Four times a day (QID) | ORAL | 0 refills | Status: DC | PRN

## 2023-11-13 NOTE — Patient Instructions (Signed)
      Blood work was ordered.       Medications changes include :   None    A referral was ordered and someone will call you to schedule an appointment.     No follow-ups on file.

## 2023-11-13 NOTE — Assessment & Plan Note (Signed)
 Acute Likely bacterial  Start Z-Pak Hydromet cough syrup otc cold medications Rest, fluid Call if no improvement

## 2023-11-13 NOTE — Progress Notes (Signed)
 Virtual Visit via Video Note  I connected with [name] [name] on 11/13/23 at  9:30 AM EDT by a video enabled telemedicine application and verified that I am speaking with the correct person using two identifiers.   I discussed the limitations of evaluation and management by telemedicine and the availability of in person appointments. The patient expressed understanding and agreed to proceed.  Present for the visit:  Myself, Dr Glade Hope, [name] [name].  The patient is currently at home and I am in the office.    No referring provider.    History of Present Illness: She is here for an acute visit for cold symptoms.   Her symptoms started 1 week  She is experiencing nasal congestion, sinus pain, minimal sore throat, hoarseness and postnasal drainage, cough that is becoming productive and a little short of breath.  She has also experienced headaches.  She has not had any fevers.  She typically gets a sinus infection once a year when the symptoms are consistent with previous infections.  She has tried taking  vicks, cough syrup and    Review of Systems  Constitutional:  Negative for fever.  HENT:  Positive for congestion, sinus pain and sore throat (minimal). Negative for ear pain.        Hoarseness, PND  Respiratory:  Positive for cough, sputum production and shortness of breath (little). Negative for wheezing.   Gastrointestinal:  Negative for diarrhea and nausea.  Musculoskeletal:  Negative for myalgias.  Neurological:  Positive for headaches. Negative for dizziness.      Social History   Socioeconomic History   Marital status: Married    Spouse name: Carlin   Number of children: 2   Years of education: 14   Highest education level: Associate degree: academic program  Occupational History   Occupation: Retired  Tobacco Use   Smoking status: Never   Smokeless tobacco: Never  Vaping Use   Vaping status: Never Used  Substance and Sexual Activity   Alcohol  use:  Not Currently    Comment: rare, once or twice a year at the most   Drug use: No   Sexual activity: Yes  Other Topics Concern   Not on file  Social History Narrative   Exercises: gym 2-3 times per week for 1-2 hours   Right handed   Caffeine: 1-2 cups coffee/day in the mornings    Two story home    Lives with husband   Social Drivers of Health   Financial Resource Strain: Low Risk  (09/26/2023)   Overall Financial Resource Strain (CARDIA)    Difficulty of Paying Living Expenses: Not hard at all  Food Insecurity: No Food Insecurity (10/22/2023)   Hunger Vital Sign    Worried About Running Out of Food in the Last Year: Never true    Ran Out of Food in the Last Year: Never true  Transportation Needs: No Transportation Needs (10/22/2023)   PRAPARE - Administrator, Civil Service (Medical): No    Lack of Transportation (Non-Medical): No  Physical Activity: Insufficiently Active (09/26/2023)   Exercise Vital Sign    Days of Exercise per Week: 2 days    Minutes of Exercise per Session: 30 min  Stress: No Stress Concern Present (10/18/2023)   Received from [facility] - Occupational Stress Questionnaire    Do you feel stress - tense, restless, nervous, or anxious, or unable to sleep at night because your mind is  troubled all the time - these days?: Not at all  Social Connections: Moderately Integrated (09/26/2023)   Social Connection and Isolation Panel    Frequency of Communication with Friends and Family: More than three times a week    Frequency of Social Gatherings with Friends and Family: Once a week    Attends Religious Services: Never    Database administrator or Organizations: Yes    Attends Engineer, structural: 1 to 4 times per year    Marital Status: Married     Observations/Objective: Appears well in NAD Sounds hoarse, intermittent coughing Breathing normally and able to speak in full sentences-no evidence of  shortness of breath Skin appears warm and dry  Assessment and Plan:  See Problem List for Assessment and Plan of chronic medical problems.   Follow Up Instructions:    I discussed the assessment and treatment plan with the patient. The patient was provided an opportunity to ask questions and all were answered. The patient agreed with the plan and demonstrated an understanding of the instructions.   The patient was advised to call back or seek an in-person evaluation if the symptoms worsen or if the condition fails to improve as anticipated.    Glade JINNY Hope, MD

## 2023-11-26 ENCOUNTER — Telehealth: Admitting: Internal Medicine

## 2023-11-26 ENCOUNTER — Encounter: Payer: Self-pay | Admitting: Internal Medicine

## 2023-11-26 ENCOUNTER — Ambulatory Visit: Payer: Self-pay

## 2023-11-26 DIAGNOSIS — R110 Nausea: Secondary | ICD-10-CM | POA: Diagnosis not present

## 2023-11-26 DIAGNOSIS — R509 Fever, unspecified: Secondary | ICD-10-CM

## 2023-11-26 DIAGNOSIS — R519 Headache, unspecified: Secondary | ICD-10-CM

## 2023-11-26 NOTE — Patient Instructions (Addendum)
 SABRA

## 2023-11-26 NOTE — Telephone Encounter (Signed)
 FYI Only or Action Required?: FYI only for provider: appointment scheduled on 11/3 (virtually).  Patient was last seen in primary care on 11/13/2023 by Geofm Glade PARAS, MD.  Called Nurse Triage reporting Fatigue.  Symptoms began several days ago.  Interventions attempted: Rest, hydration, or home remedies.  Symptoms are: gradually worsening.  Triage Disposition: See Physician Within 24 Hours  Patient/caregiver understands and will follow disposition?: Yes Patient lives 2 hours away, requesting VV if possible  Copied from CRM #[id number]. Topic: Clinical - Red Word Triage >> Nov 26, 2023  8:50 AM Frederich PARAS wrote: [facility] that prompted transfer to Nurse Triage: extreme fatigue  couple days ago, chills and trembling ans shivering all night, then he got really weak, she ate then she started sweating, pillow and evertyhing wet, weak, nausea , extreme fatigue. Reason for Disposition  [1] MODERATE weakness (e.g., interferes with work, school, normal activities) AND [2] persists > 3 days  Answer Assessment - Initial Assessment Questions 1. DESCRIPTION: Describe how you are feeling.     Feeling weak and fatigued. Weakness comes and goes  2. SEVERITY: How bad is it?  Can you stand and walk?     Still able to stand and walk. Has some trouble in the morning but gets stronger during the day  3. ONSET: When did these symptoms begin? (e.g., hours, days, weeks, months)     Started a couple of days ago  4. CAUSE: What do you think is causing the weakness or fatigue? (e.g., not drinking enough fluids, medical problem, trouble sleeping)     Unsure  5. NEW MEDICINES:  Have you started on any new medicines recently? (e.g., opioid pain medicines, benzodiazepines, muscle relaxants, antidepressants, antihistamines, neuroleptics, beta blockers)     No  6. OTHER SYMPTOMS: Do you have any other symptoms? (e.g., chest pain, fever, cough, SOB, vomiting, diarrhea, bleeding, other areas of pain)      Shivering and chills, nausea, sweating, hips feel weak   7. PREGNANCY: Is there any chance you are pregnant? When was your last menstrual period?     *No Answer*  Protocols used: Weakness (Generalized) and Fatigue-A-AH

## 2023-11-26 NOTE — Progress Notes (Signed)
 Virtual Visit via Video Note  I connected with [name] [name] on 11/26/23 at  2:20 PM EST by a video enabled telemedicine application and verified that I am speaking with the correct person using two identifiers.   I discussed the limitations of evaluation and management by telemedicine and the availability of in person appointments. The patient expressed understanding and agreed to proceed.  Present for the visit:  Myself, Dr Glade Hope, [name] [name].  The patient is currently at home and I am in the office.    No referring provider.    History of Present Illness: This visit is an acute visit for fatigue.  Went to [place]  last week x 4-5 days.  Went trick or treating with granddaughter. No one else has been sick.    Her symptoms started a couple of days ago and has gradually worsened.  She states chills, sweats, fever and fatigue.  She has a runny nose, nausea, headaches and a stiff neck.    She is taking tylenol .   She took her temperature with a nontouch thermometer while we were on the phone and it was 105 - she thinks it was not working.  She used an old manual thermometer and after just over one minute it was close to 104.     Review of Systems  Constitutional:  Positive for chills, diaphoresis, fever and malaise/fatigue.       Decreased appetite  HENT:  Negative for congestion, ear pain, sinus pain and sore throat.        Runny nose  Eyes:  Negative for blurred vision.  Respiratory:  Negative for cough, shortness of breath and wheezing.   Gastrointestinal:  Positive for nausea. Negative for abdominal pain, diarrhea and vomiting.  Skin:  Negative for rash.  Neurological:  Positive for headaches (neck stiffness up to back of head). Negative for dizziness.      Social History   Socioeconomic History   Marital status: Married    Spouse name: Carlin   Number of children: 2   Years of education: 14   Highest education level: Associate degree: academic program   Occupational History   Occupation: Retired  Tobacco Use   Smoking status: Never   Smokeless tobacco: Never  Vaping Use   Vaping status: Never Used  Substance and Sexual Activity   Alcohol  use: Not Currently    Comment: rare, once or twice a year at the most   Drug use: No   Sexual activity: Yes  Other Topics Concern   Not on file  Social History Narrative   Exercises: gym 2-3 times per week for 1-2 hours   Right handed   Caffeine: 1-2 cups coffee/day in the mornings    Two story home    Lives with husband   Social Drivers of Health   Financial Resource Strain: Low Risk  (09/26/2023)   Overall Financial Resource Strain (CARDIA)    Difficulty of Paying Living Expenses: Not hard at all  Food Insecurity: No Food Insecurity (10/22/2023)   Hunger Vital Sign    Worried About Running Out of Food in the Last Year: Never true    Ran Out of Food in the Last Year: Never true  Transportation Needs: No Transportation Needs (10/22/2023)   PRAPARE - Administrator, Civil Service (Medical): No    Lack of Transportation (Non-Medical): No  Physical Activity: Insufficiently Active (09/26/2023)   Exercise Vital Sign    Days of Exercise per Week: 2 days  Minutes of Exercise per Session: 30 min  Stress: No Stress Concern Present (10/18/2023)   Received from [facility] - Occupational Stress Questionnaire    Do you feel stress - tense, restless, nervous, or anxious, or unable to sleep at night because your mind is troubled all the time - these days?: Not at all  Social Connections: Moderately Integrated (09/26/2023)   Social Connection and Isolation Panel    Frequency of Communication with Friends and Family: More than three times a week    Frequency of Social Gatherings with Friends and Family: Once a week    Attends Religious Services: Never    Database Administrator or Organizations: Yes    Attends Banker Meetings: 1 to 4  times per year    Marital Status: Married     Observations/Objective: Appears well in NAD Appears slightly flushed Breathing normally, speaking in full sentences   Assessment and Plan:  Encounter Diagnoses  Name Primary?   Fever, unspecified fever cause Yes   Acute nonintractable headache, unspecified headache type    Nausea    Acute Symptoms started two days ago with fever, chills, sweats, fatigue, runny nose, headaches, neck stiffness, nausea Temp up to 105 Likely viral - concern for viral meningitis Advised that she go to urgent care for in person evaluation and she agrees to do so She will update me with their findings   Follow Up Instructions:    I discussed the assessment and treatment plan with the patient. The patient was provided an opportunity to ask questions and all were answered. The patient agreed with the plan and demonstrated an understanding of the instructions.   The patient was advised to call back or seek an in-person evaluation if the symptoms worsen or if the condition fails to improve as anticipated.    Glade JINNY Hope, MD

## 2023-11-28 ENCOUNTER — Encounter: Payer: Self-pay | Admitting: Internal Medicine

## 2023-11-28 NOTE — Progress Notes (Signed)
 Case Management Final Discharge Note Patient Information: Name: [name] [name]  DOB:1955/08/23 Gender:female MRN: [id number]  Admission Date: 11/26/2023 Discharge Date:11/28/2023 11:13 AM Primary Care Provider: No Pcp-Ppi Declined Preferred Pharmacy: [facility] [id number] [facility], [place] - [address] - PHONE: [phone] - FAX: [phone]   Unplanned Readmission Score:  5.77 Discharge address: [address] [place] [address] Discharge Transportation: spouse  Discharge Barriers Discharge Barriers Identified  : No Barriers Identified  Discharge Plan Discharge Disposition: Home or Self Care Resumption of care: Yes - related to current stay        Anticipated Discharge Location: Home  If Plan A discharging location is not feasible: Potential Plan B: Home  Caregiver at home: NA  Discharge Readiness:  Discharge Plan was discussed with Patient Any concerns with discharge plan: No concerns verbalized with current anticipated discharge plan         Final Destination Choice:    Final Assessment Narrative: Patient for d/c home- no CM needs      Post Acute Referral Placement   No notes of this type exist for this encounter.    Assessment Completed by: Suzen Furry

## 2023-11-29 ENCOUNTER — Telehealth: Payer: Self-pay | Admitting: *Deleted

## 2023-11-29 NOTE — Transitions of Care (Post Inpatient/ED Visit) (Signed)
 11/29/2023  Name: [name] [name] MRN: [id number] DOB: 1955-08-30  Today's TOC FU Call Status: Today's TOC FU Call Status:: Successful TOC FU Call Completed TOC FU Call Complete Date: 11/29/23 Patient's Name and Date of Birth confirmed.  Transition Care Management Follow-up Telephone Call Date of Discharge: 11/28/23 Discharge Facility: Other [facility]) Name of Other ([facility]) Discharge Facility: [facility] Type of Discharge: Inpatient Admission Primary Inpatient Discharge Diagnosis:: Acute cystitis with fever and hematuria: UTI post- recent uretal stent placement How have you been since you were released from the hospital?: Better (I am fine, feeling much better; taking the antibiotcs like they tell me to; going to see the urologist tomorrow, so want to hold off on scheduling with Dr. Geofm for now.  I don't think I need any follow up calls- I am on top of this and am independent) Any questions or concerns?: No  Items Reviewed: Did you receive and understand the discharge instructions provided?: Yes (briefly reviewed with patient who verbalizes good understanding of same - outside hospital AVS) Medications obtained,verified, and reconciled?: No (Confirmed per patient report obtained/ is taking all newly Rx'd medications as instructed; self-manages medications and denies questions/ concerns around medications today) Medications Not Reviewed Reasons:: Other: (Declined all aspects of medication review) Any new allergies since your discharge?: No Dietary orders reviewed?: Yes Type of Diet Ordered:: I eat regular diet but do try to watch how much sugar I eat Do you have support at home?: Yes People in Home [RPT]: spouse Name of Support/Comfort Primary Source: Reports independent in self-care activities; resides with supportive spouse assists as/ if needed/ indicated  Medications Reviewed Today: Medications Reviewed Today     Reviewed by Jadence Kinlaw M, RN (Registered  Nurse) on 11/29/23 at [phone]  Med List Status: <None>   Medication Order Taking? Sig Documenting Provider Last Dose Status Informant  azithromycin  (ZITHROMAX ) 250 MG tablet [id number]  Take two tabs the first day and then one tab daily for four days Geofm Glade PARAS, MD  Active   finasteride  (PROSCAR ) 5 MG tablet [id number]  Take 1.5 mg by mouth daily. [provider]  Active   HYDROcodone  bit-homatropine (HYDROMET) 5-1.5 MG/5ML syrup [id number]  Take 5 mLs by mouth every 6 (six) hours as needed for cough. Geofm Glade PARAS, MD  Active   levothyroxine  (SYNTHROID ) 112 MCG tablet [id number]  TAKE 1 TABLET BY MOUTH DAILY Geofm Glade PARAS, MD  Active   losartan  (COZAAR ) 100 MG tablet [id number]  TAKE 1 TABLET BY MOUTH DAILY Burns, Glade PARAS, MD  Active   Multiple Vitamins-Minerals (HAIR/SKIN/NAILS/BIOTIN  PO) [id number]  Take 1 tablet by mouth daily. [provider]  Active Self  Polyvinyl Alcohol -Povidone PF (REFRESH) 1.4-0.6 % SOLN [id number]  Place 1 drop into both eyes 3 (three) times daily as needed (dry eyes). [provider]  Active Self  rosuvastatin  (CRESTOR ) 20 MG tablet [id number]  TAKE 1 TABLET BY MOUTH DAILY Burns, Glade PARAS, MD  Active   sertraline  (ZOLOFT ) 50 MG tablet [id number]  Take 1 tablet (50 mg total) by mouth daily. Geofm Glade PARAS, MD  Active   tamsulosin  (FLOMAX ) 0.4 MG CAPS capsule [id number]  Take 1 capsule (0.4 mg total) by mouth daily. Geofm Glade PARAS, MD  Active            Home Care and Equipment/Supplies: Were Home Health Services Ordered?: No Any new equipment or medical supplies ordered?: No  Functional Questionnaire: Do you need assistance with bathing/showering or dressing?: No Do you need  assistance with meal preparation?: No Do you need assistance with eating?: No Do you have difficulty maintaining continence: No Do you need assistance with getting out of bed/getting out of a chair/moving?: No Do you have difficulty managing or taking your medications?:  No  Follow up appointments reviewed: PCP Follow-up appointment confirmed?: No (Declined care coordination outreach in real-time with scheduling care guide to schedule hospital follow up PCP appointment- reports going to urology provider 11/30/23 and will call PCP to schedule after that if urology recommends) MD Provider Line [phone] Given: No (verified well-established with current PCP) Specialist Hospital Follow-up appointment confirmed?: Yes Date of Specialist follow-up appointment?: 11/30/23 Follow-Up Specialty Provider:: My urologist Do you need transportation to your follow-up appointment?: No Do you understand care options if your condition(s) worsen?: Yes-patient verbalized understanding  SDOH Interventions Today    Flowsheet Row Most Recent Value  SDOH Interventions   Food Insecurity Interventions Intervention Not Indicated  Housing Interventions Intervention Not Indicated  Transportation Interventions Intervention Not Indicated  [drives self,  husband assists as- if indicated]  Utilities Interventions Intervention Not Indicated   See TOC assessment tabs for additional assessment/ TOC intervention information Provided education and reinforcement signs/ symptoms UTI along with corresponding action plan: patient able to verbalize same with minimal prompting  Patient declines need for ongoing/ further care management/ coordination outreach; declines enrollment in 30-day TOC program- declines taking my direct phone number should needs/ concerns arise post-TOC call   Pls call/ message for questions,  Randie Bloodgood Mckinney Mahayla Haddaway, RN, BSN, Media Planner  Transitions of Care  [facility] - [facility] [phone]: direct office

## 2023-12-05 ENCOUNTER — Encounter: Payer: Self-pay | Admitting: Adult Health

## 2024-01-29 ENCOUNTER — Telehealth: Payer: Self-pay

## 2024-01-29 ENCOUNTER — Ambulatory Visit: Payer: Self-pay

## 2024-01-29 NOTE — Telephone Encounter (Signed)
 Copied from CRM [id number]. Topic: Clinical - Medication Question >> Jan 29, 2024  3:48 PM Rea ORN wrote: Reason for CRM: Russell with [facility] called to see if clinic received the faxed refill request for the following rx's: levothyroxine  (SYNTHROID ) 112 MCG tablet losartan  (COZAAR ) 100 MG tablet rosuvastatin  (CRESTOR ) 20 MG tablet sertraline  (ZOLOFT ) 50 MG tablet  Alisha stated the pt reached out to them 12/22 to switch. I advised that the pt does not have [facility] listed as a preferred pharmacy.   Please call back to advise if these rx's will be ordered and sent to new pharmacy,  [phone]

## 2024-01-29 NOTE — Telephone Encounter (Signed)
 FYI Only or Action Required?: FYI only for provider: appointment scheduled on 1/9.  Patient was last seen in primary care on 11/26/2023 by Geofm Glade PARAS, MD.  Called Nurse Triage reporting Cough.  Symptoms began 12/26.  Interventions attempted: Rest, hydration, or home remedies.  Symptoms are: unchanged.  Triage Disposition: See Physician Within 24 Hours  Patient/caregiver understands and will follow disposition?: No, wishes to speak with PCP  Copied from CRM #[id number]. Topic: Clinical - Red Word Triage >> Jan 29, 2024  2:06 PM Berneda FALCON wrote: Red Word that prompted transfer to Nurse Triage: Patient states she has a severe cough and has large amounts of green and yellow mucus. This has been ongoing since 12/26 when she was positive for the flu. Reason for Disposition  SEVERE coughing spells (e.g., whooping sound after coughing, vomiting after coughing)  Answer Assessment - Initial Assessment Questions 1. ONSET: When did the cough begin?      12/26 2. SEVERITY: How bad is the cough today?      severe 3. SPUTUM: Describe the color of your sputum (e.g., none, dry cough; clear, white, yellow, green)     Green/yellow 4. HEMOPTYSIS: Are you coughing up any blood? If Yes, ask: How much? (e.g., flecks, streaks, tablespoons, etc.)     denies 5. DIFFICULTY BREATHING: Are you having difficulty breathing? If Yes, ask: How bad is it? (e.g., mild, moderate, severe)      denies 6. FEVER: Do you have a fever? If Yes, ask: What is your temperature, how was it measured, and when did it start?     denies 10. OTHER SYMPTOMS: Do you have any other symptoms? (e.g., runny nose, wheezing, chest pain)       denies  Tested positive for the flu on 12/26. Pt only willing to see pcp, prefers [facility] d/t living in [place].  Protocols used: Cough - Acute Productive-A-AH

## 2024-01-30 ENCOUNTER — Other Ambulatory Visit: Payer: Self-pay

## 2024-01-30 MED ORDER — LEVOTHYROXINE SODIUM 112 MCG PO TABS: 112.0000 ug | ORAL_TABLET | Freq: Every day | ORAL | 2 refills | Status: AC

## 2024-01-30 MED ORDER — LOSARTAN POTASSIUM 100 MG PO TABS: 100.0000 mg | ORAL_TABLET | Freq: Every day | ORAL | 2 refills | Status: AC

## 2024-01-30 MED ORDER — ROSUVASTATIN CALCIUM 20 MG PO TABS: 20.0000 mg | ORAL_TABLET | Freq: Every day | ORAL | 2 refills | Status: AC

## 2024-01-30 MED ORDER — SERTRALINE HCL 50 MG PO TABS: 50.0000 mg | ORAL_TABLET | Freq: Every day | ORAL | 1 refills | Status: AC

## 2024-01-30 NOTE — Telephone Encounter (Signed)
 Spoke with patient today and refills sent to [facility].

## 2024-01-31 NOTE — Progress Notes (Signed)
 Virtual Visit via Video Note  I connected with [name] [name] on 01/31/2024 at  8:30 AM EST by a video enabled telemedicine application and verified that I am speaking with the correct person using two identifiers.   I discussed the limitations of evaluation and management by telemedicine and the availability of in person appointments. The patient expressed understanding and agreed to proceed.  Present for the visit:  Myself, Dr Glade Hope, [name] [name].  The patient is currently at home and I am in the office.    No referring provider.    History of Present Illness: She is here for an acute visit for cold symptoms.   She had the flu just after Christmas and is still sick.  She thinks most of the flu symptoms have improved, but feels like she has developed a sinus infection.  She is experiencing nasal congestion, sinus pain/pressure, PND, cough, occasional shortness of breath but that is mostly when she gets mucus stuck in her airway, fatigue and headaches.  She has tried taking nyquil, dayquil, tessalon  perles    Review of Systems  Constitutional:  Negative for chills and fever.  HENT:  Positive for congestion and sinus pain. Negative for ear pain and sore throat.        PND  Respiratory:  Positive for cough and shortness of breath (occ - sometimes when mucus gets in throat). Negative for wheezing.   Neurological:  Positive for headaches. Negative for dizziness.      Social History   Socioeconomic History   Marital status: Married    Spouse name: Carlin   Number of children: 2   Years of education: 14   Highest education level: Associate degree: academic program  Occupational History   Occupation: Retired  Tobacco Use   Smoking status: Never   Smokeless tobacco: Never  Vaping Use   Vaping status: Never Used  Substance and Sexual Activity   Alcohol  use: Not Currently    Comment: rare, once or twice a year at the most   Drug use: No   Sexual activity: Yes   Other Topics Concern   Not on file  Social History Narrative   Exercises: gym 2-3 times per week for 1-2 hours   Right handed   Caffeine: 1-2 cups coffee/day in the mornings    Two story home    Lives with husband   Social Drivers of Health   Tobacco Use: Low Risk (11/26/2023)   Received from [facility]   Patient History    Smoking Tobacco Use: Never    Smokeless Tobacco Use: Never    Passive Exposure: Not on file  Financial Resource Strain: Low Risk (09/26/2023)   Overall Financial Resource Strain (CARDIA)    Difficulty of Paying Living Expenses: Not hard at all  Food Insecurity: Low Risk (11/30/2023)   Received from [facility]   Epic    Within the past 12 months, you worried that your food would run out before you got money to buy more: Never true    Within the past 12 months, the food you bought just didn't last and you didn't have money to get more. : Never true  Transportation Needs: No Transportation Needs (11/30/2023)   Received from [facility]    In the past 12 months, has lack of reliable transportation kept you from medical appointments, meetings, work or from getting things needed for daily living? : No  Physical Activity: Insufficiently Active (09/26/2023)   Exercise  Vital Sign    Days of Exercise per Week: 2 days    Minutes of Exercise per Session: 30 min  Stress: No Stress Concern Present (10/18/2023)   Received from [facility] of Occupational Health - Occupational Stress Questionnaire    Do you feel stress - tense, restless, nervous, or anxious, or unable to sleep at night because your mind is troubled all the time - these days?: Not at all  Social Connections: Moderately Integrated (09/26/2023)   Social Connection and Isolation Panel    Frequency of Communication with Friends and Family: More than three times a week    Frequency of Social Gatherings with Friends and Family: Once a week    Attends Religious Services:  Never    Database Administrator or Organizations: Yes    Attends Banker Meetings: 1 to 4 times per year    Marital Status: Married  Depression (PHQ2-9): Low Risk (11/29/2023)   Depression (PHQ2-9)    PHQ-2 Score: 0  Alcohol  Screen: Low Risk (09/26/2023)   Alcohol  Screen    Last Alcohol  Screening Score (AUDIT): 1  Housing: Low Risk (11/30/2023)   Received from [facility]   Epic    What is your living situation today?: I have a steady place to live    Think about the place you live. Do you have problems with any of the following? Choose all that apply:: None/None on this list  Utilities: Low Risk (11/30/2023)   Received from [facility]   Utilities    In the past 12 months has the electric, gas, oil, or water company threatened to shut off services in your home? : No  Health Literacy: Adequate Health Literacy (03/15/2023)   B1300 Health Literacy    Frequency of need for help with medical instructions: Never     Observations/Objective: Appears well in NAD Breathing normally, speaking in full sentences Skin appears warm and dry  Assessment and Plan:  See Problem List for Assessment and Plan of chronic medical problems.   Follow Up Instructions:    I discussed the assessment and treatment plan with the patient. The patient was provided an opportunity to ask questions and all were answered. The patient agreed with the plan and demonstrated an understanding of the instructions.   The patient was advised to call back or seek an in-person evaluation if the symptoms worsen or if the condition fails to improve as anticipated.    Glade JINNY Hope, MD

## 2024-02-01 ENCOUNTER — Encounter: Payer: Self-pay | Admitting: Internal Medicine

## 2024-02-01 ENCOUNTER — Telehealth: Admitting: Internal Medicine

## 2024-02-01 DIAGNOSIS — J019 Acute sinusitis, unspecified: Secondary | ICD-10-CM | POA: Diagnosis not present

## 2024-02-01 MED ORDER — BENZONATATE 200 MG PO CAPS: 200.0000 mg | ORAL_CAPSULE | Freq: Three times a day (TID) | ORAL | 0 refills | Status: AC | PRN

## 2024-02-01 MED ORDER — AZITHROMYCIN 250 MG PO TABS: ORAL_TABLET | ORAL | 0 refills | Status: AC

## 2024-02-01 NOTE — Patient Instructions (Signed)
      Blood work was ordered.       Medications changes include :   None    A referral was ordered and someone will call you to schedule an appointment.     No follow-ups on file.

## 2024-02-01 NOTE — Assessment & Plan Note (Addendum)
 Acute Likely bacterial-secondary to flu infection Start Z-Pak, Tessalon  Perles otc cold medications Rest, fluid Call if no improvement

## 2024-03-20 ENCOUNTER — Ambulatory Visit

## 2024-03-20 ENCOUNTER — Encounter: Admitting: Internal Medicine

## 2024-10-09 ENCOUNTER — Telehealth: Admitting: Adult Health
